# Patient Record
Sex: Female | Born: 1955
Health system: Southern US, Community
[De-identification: ages and names within clinical notes are randomized; demographics above are authoritative.]

## PROBLEM LIST (undated history)

## (undated) DIAGNOSIS — R011 Cardiac murmur, unspecified: Secondary | ICD-10-CM

## (undated) DIAGNOSIS — R0600 Dyspnea, unspecified: Secondary | ICD-10-CM

## (undated) DIAGNOSIS — F419 Anxiety disorder, unspecified: Secondary | ICD-10-CM

## (undated) DIAGNOSIS — I10 Essential (primary) hypertension: Secondary | ICD-10-CM

## (undated) DIAGNOSIS — J45909 Unspecified asthma, uncomplicated: Secondary | ICD-10-CM

## (undated) DIAGNOSIS — R0602 Shortness of breath: Secondary | ICD-10-CM

## (undated) DIAGNOSIS — R112 Nausea with vomiting, unspecified: Secondary | ICD-10-CM

## (undated) DIAGNOSIS — M797 Fibromyalgia: Secondary | ICD-10-CM

## (undated) DIAGNOSIS — K219 Gastro-esophageal reflux disease without esophagitis: Secondary | ICD-10-CM

## (undated) DIAGNOSIS — J189 Pneumonia, unspecified organism: Secondary | ICD-10-CM

## (undated) DIAGNOSIS — G4730 Sleep apnea, unspecified: Secondary | ICD-10-CM

## (undated) DIAGNOSIS — F32A Depression, unspecified: Secondary | ICD-10-CM

## (undated) DIAGNOSIS — G2581 Restless legs syndrome: Secondary | ICD-10-CM

## (undated) DIAGNOSIS — E039 Hypothyroidism, unspecified: Secondary | ICD-10-CM

## (undated) DIAGNOSIS — Z9889 Other specified postprocedural states: Secondary | ICD-10-CM

## (undated) DIAGNOSIS — F329 Major depressive disorder, single episode, unspecified: Secondary | ICD-10-CM

## (undated) DIAGNOSIS — M1990 Unspecified osteoarthritis, unspecified site: Secondary | ICD-10-CM

## (undated) DIAGNOSIS — K5900 Constipation, unspecified: Secondary | ICD-10-CM

## (undated) DIAGNOSIS — I700 Atherosclerosis of aorta: Secondary | ICD-10-CM

## (undated) DIAGNOSIS — R351 Nocturia: Secondary | ICD-10-CM

## (undated) DIAGNOSIS — R7303 Prediabetes: Secondary | ICD-10-CM

## (undated) HISTORY — PX: NASAL SINUS SURGERY: SHX719

## (undated) HISTORY — PX: COLONOSCOPY: SHX174

## (undated) HISTORY — PX: KNEE SURGERY: SHX244

## (undated) HISTORY — PX: KNEE ARTHROSCOPY: SUR90

## (undated) HISTORY — PX: TUBAL LIGATION: SHX77

## (undated) HISTORY — PX: SHOULDER SURGERY: SHX246

## (undated) HISTORY — PX: ABDOMINAL HYSTERECTOMY: SHX81

## (undated) HISTORY — PX: CHOLECYSTECTOMY: SHX55

## (undated) HISTORY — PX: CERVICAL DISC SURGERY: SHX588

## (undated) HISTORY — PX: SHOULDER DEBRIDEMENT: SHX1052

## (undated) HISTORY — PX: OTHER SURGICAL HISTORY: SHX169

## (undated) HISTORY — DX: Unspecified asthma, uncomplicated: J45.909

## (undated) HISTORY — DX: Atherosclerosis of aorta: I70.0

## (undated) HISTORY — PX: SINOSCOPY: SHX187

## (undated) HISTORY — PX: ANTERIOR CERVICAL DECOMP/DISCECTOMY FUSION: SHX1161

---

## 2006-01-02 ENCOUNTER — Ambulatory Visit (HOSPITAL_COMMUNITY): Admission: RE | Admit: 2006-01-02 | Discharge: 2006-01-03 | Payer: Self-pay | Admitting: Orthopedic Surgery

## 2006-04-04 ENCOUNTER — Ambulatory Visit (HOSPITAL_COMMUNITY): Admission: RE | Admit: 2006-04-04 | Discharge: 2006-04-05 | Payer: Self-pay | Admitting: Orthopedic Surgery

## 2006-10-02 ENCOUNTER — Ambulatory Visit (HOSPITAL_COMMUNITY): Admission: RE | Admit: 2006-10-02 | Discharge: 2006-10-03 | Payer: Self-pay | Admitting: Orthopedic Surgery

## 2007-12-08 ENCOUNTER — Encounter: Admission: RE | Admit: 2007-12-08 | Discharge: 2007-12-08 | Payer: Self-pay | Admitting: Orthopedic Surgery

## 2008-01-26 ENCOUNTER — Ambulatory Visit (HOSPITAL_COMMUNITY): Admission: RE | Admit: 2008-01-26 | Discharge: 2008-01-28 | Payer: Self-pay | Admitting: Orthopedic Surgery

## 2008-03-02 ENCOUNTER — Encounter: Admission: RE | Admit: 2008-03-02 | Discharge: 2008-03-02 | Payer: Self-pay | Admitting: Orthopedic Surgery

## 2008-04-21 ENCOUNTER — Ambulatory Visit (HOSPITAL_COMMUNITY): Admission: RE | Admit: 2008-04-21 | Discharge: 2008-04-22 | Payer: Self-pay | Admitting: Neurosurgery

## 2008-04-24 ENCOUNTER — Emergency Department (HOSPITAL_COMMUNITY): Admission: EM | Admit: 2008-04-24 | Discharge: 2008-04-25 | Payer: Self-pay | Admitting: Emergency Medicine

## 2008-08-02 ENCOUNTER — Ambulatory Visit (HOSPITAL_COMMUNITY): Admission: RE | Admit: 2008-08-02 | Discharge: 2008-08-02 | Payer: Self-pay | Admitting: Orthopedic Surgery

## 2008-08-05 ENCOUNTER — Inpatient Hospital Stay (HOSPITAL_COMMUNITY): Admission: EM | Admit: 2008-08-05 | Discharge: 2008-08-09 | Payer: Self-pay | Admitting: Emergency Medicine

## 2008-08-06 DIAGNOSIS — L089 Local infection of the skin and subcutaneous tissue, unspecified: Secondary | ICD-10-CM

## 2008-08-06 HISTORY — DX: Local infection of the skin and subcutaneous tissue, unspecified: L08.9

## 2008-08-07 ENCOUNTER — Ambulatory Visit: Payer: Self-pay | Admitting: Infectious Diseases

## 2008-08-10 ENCOUNTER — Encounter: Payer: Self-pay | Admitting: Infectious Diseases

## 2008-08-23 ENCOUNTER — Encounter: Payer: Self-pay | Admitting: Infectious Diseases

## 2008-08-23 DIAGNOSIS — S40919A Unspecified superficial injury of unspecified shoulder, initial encounter: Secondary | ICD-10-CM

## 2008-08-23 DIAGNOSIS — S40929A Unspecified superficial injury of unspecified upper arm, initial encounter: Secondary | ICD-10-CM

## 2008-08-23 DIAGNOSIS — Z8719 Personal history of other diseases of the digestive system: Secondary | ICD-10-CM | POA: Insufficient documentation

## 2008-08-23 DIAGNOSIS — Z9889 Other specified postprocedural states: Secondary | ICD-10-CM | POA: Insufficient documentation

## 2008-08-23 DIAGNOSIS — L089 Local infection of the skin and subcutaneous tissue, unspecified: Secondary | ICD-10-CM | POA: Insufficient documentation

## 2008-08-23 DIAGNOSIS — Z981 Arthrodesis status: Secondary | ICD-10-CM | POA: Insufficient documentation

## 2008-08-23 DIAGNOSIS — Z9089 Acquired absence of other organs: Secondary | ICD-10-CM | POA: Insufficient documentation

## 2008-08-23 DIAGNOSIS — Z90710 Acquired absence of both cervix and uterus: Secondary | ICD-10-CM

## 2008-08-23 HISTORY — DX: Arthrodesis status: Z98.1

## 2008-08-23 HISTORY — DX: Acquired absence of both cervix and uterus: Z90.710

## 2008-08-26 ENCOUNTER — Encounter: Payer: Self-pay | Admitting: Infectious Diseases

## 2008-08-26 ENCOUNTER — Telehealth: Payer: Self-pay | Admitting: Infectious Diseases

## 2008-09-05 ENCOUNTER — Encounter: Payer: Self-pay | Admitting: Infectious Diseases

## 2008-09-05 ENCOUNTER — Ambulatory Visit: Payer: Self-pay | Admitting: Infectious Diseases

## 2008-09-05 LAB — CONVERTED CEMR LAB
Albumin: 4.2 g/dL (ref 3.5–5.2)
Alkaline Phosphatase: 95 units/L (ref 39–117)
Basophils Relative: 1 % (ref 0–1)
CO2: 24 meq/L (ref 19–32)
CRP: 1.4 mg/dL — ABNORMAL HIGH (ref <0.6)
Chloride: 102 meq/L (ref 96–112)
Creatinine, Ser: 0.77 mg/dL (ref 0.40–1.20)
Eosinophils Relative: 4 % (ref 0–5)
Glucose, Bld: 83 mg/dL (ref 70–99)
HCT: 41.2 % (ref 36.0–46.0)
Hemoglobin: 13.4 g/dL (ref 12.0–15.0)
Lymphs Abs: 2.1 10*3/uL (ref 0.7–4.0)
MCHC: 32.5 g/dL (ref 30.0–36.0)
Monocytes Relative: 8 % (ref 3–12)
Neutrophils Relative %: 57 % (ref 43–77)
Platelets: 229 10*3/uL (ref 150–400)
Potassium: 4.5 meq/L (ref 3.5–5.3)
RBC: 4.75 M/uL (ref 3.87–5.11)
Sodium: 138 meq/L (ref 135–145)

## 2008-09-07 ENCOUNTER — Encounter: Payer: Self-pay | Admitting: Infectious Diseases

## 2008-09-08 ENCOUNTER — Telehealth: Payer: Self-pay | Admitting: Infectious Diseases

## 2008-09-14 ENCOUNTER — Telehealth: Payer: Self-pay

## 2008-09-19 ENCOUNTER — Ambulatory Visit: Payer: Self-pay | Admitting: Infectious Diseases

## 2008-09-19 LAB — CONVERTED CEMR LAB
Basophils Relative: 1 % (ref 0–1)
Eosinophils Absolute: 0.2 10*3/uL (ref 0.0–0.7)
HCT: 41.4 % (ref 36.0–46.0)
Hemoglobin: 13.5 g/dL (ref 12.0–15.0)
MCHC: 32.6 g/dL (ref 30.0–36.0)
MCV: 86.3 fL (ref >=78.0)
Platelets: 314 10*3/uL (ref 150–400)
RDW: 12.8 % (ref 11.5–15.5)

## 2008-09-20 ENCOUNTER — Encounter: Payer: Self-pay | Admitting: Infectious Diseases

## 2008-09-22 ENCOUNTER — Encounter: Payer: Self-pay | Admitting: Infectious Diseases

## 2008-09-24 ENCOUNTER — Encounter: Admission: RE | Admit: 2008-09-24 | Discharge: 2008-09-24 | Payer: Self-pay | Admitting: Infectious Diseases

## 2008-09-26 ENCOUNTER — Telehealth: Payer: Self-pay | Admitting: Infectious Diseases

## 2008-09-30 ENCOUNTER — Telehealth: Payer: Self-pay | Admitting: Infectious Diseases

## 2008-10-06 ENCOUNTER — Ambulatory Visit (HOSPITAL_BASED_OUTPATIENT_CLINIC_OR_DEPARTMENT_OTHER): Admission: RE | Admit: 2008-10-06 | Discharge: 2008-10-06 | Payer: Self-pay | Admitting: Orthopedic Surgery

## 2008-10-18 ENCOUNTER — Ambulatory Visit: Payer: Self-pay | Admitting: Infectious Diseases

## 2008-11-21 ENCOUNTER — Encounter: Payer: Self-pay | Admitting: Infectious Diseases

## 2009-04-06 ENCOUNTER — Ambulatory Visit (HOSPITAL_BASED_OUTPATIENT_CLINIC_OR_DEPARTMENT_OTHER): Admission: RE | Admit: 2009-04-06 | Discharge: 2009-04-06 | Payer: Self-pay | Admitting: Orthopedic Surgery

## 2009-06-13 ENCOUNTER — Encounter: Admission: RE | Admit: 2009-06-13 | Discharge: 2009-06-13 | Payer: Self-pay | Admitting: Orthopedic Surgery

## 2010-02-25 ENCOUNTER — Encounter: Payer: Self-pay | Admitting: Orthopedic Surgery

## 2010-03-05 LAB — BASIC METABOLIC PANEL
BUN: 20 mg/dL (ref 6–23)
CO2: 27 mEq/L (ref 19–32)
Calcium: 9.3 mg/dL (ref 8.4–10.5)
Chloride: 105 mEq/L (ref 96–112)
Creatinine, Ser: 0.85 mg/dL (ref 0.4–1.2)
GFR calc Af Amer: >60 mL/min (ref >60)
GFR calc non Af Amer: >60 mL/min (ref >60)
Glucose, Bld: 66 mg/dL — ABNORMAL LOW (ref 70–99)
Potassium: 5.3 mEq/L — ABNORMAL HIGH (ref 3.5–5.1)
Sodium: 138 mEq/L (ref 135–145)

## 2010-03-05 LAB — SURGICAL PCR SCREEN
MRSA, PCR: NEGATIVE
Staphylococcus aureus: POSITIVE — AB

## 2010-03-05 LAB — CBC
HCT: 40.2 % (ref 36.0–46.0)
Hemoglobin: 13.2 g/dL (ref 12.0–15.0)
MCH: 28.5 pg (ref 26.0–34.0)
MCHC: 32.8 g/dL (ref 30.0–36.0)
MCV: 86.8 fL (ref 78.0–100.0)
Platelets: 286 10*3/uL (ref 150–400)
RBC: 4.63 MIL/uL (ref 3.87–5.11)
RDW: 12.6 % (ref 11.5–15.5)
WBC: 10 10*3/uL (ref 4.0–10.5)

## 2010-03-08 ENCOUNTER — Ambulatory Visit (HOSPITAL_COMMUNITY): Payer: MEDICARE

## 2010-03-08 ENCOUNTER — Inpatient Hospital Stay (HOSPITAL_COMMUNITY)
Admission: RE | Admit: 2010-03-08 | Discharge: 2010-03-09 | DRG: 473 | Disposition: A | Discharge status: Home / Self Care | Payer: MEDICARE | Source: Ambulatory Visit | Attending: Neurosurgery | Admitting: Neurosurgery

## 2010-03-08 ENCOUNTER — Other Ambulatory Visit (HOSPITAL_COMMUNITY): Payer: Self-pay | Admitting: Neurosurgery

## 2010-03-08 DIAGNOSIS — M5412 Radiculopathy, cervical region: Secondary | ICD-10-CM

## 2010-03-08 DIAGNOSIS — M5030 Other cervical disc degeneration, unspecified cervical region: Secondary | ICD-10-CM | POA: Diagnosis present

## 2010-03-08 DIAGNOSIS — I10 Essential (primary) hypertension: Secondary | ICD-10-CM | POA: Diagnosis present

## 2010-03-08 DIAGNOSIS — M47812 Spondylosis without myelopathy or radiculopathy, cervical region: Secondary | ICD-10-CM | POA: Diagnosis present

## 2010-03-08 DIAGNOSIS — K219 Gastro-esophageal reflux disease without esophagitis: Secondary | ICD-10-CM | POA: Diagnosis present

## 2010-03-08 DIAGNOSIS — Z981 Arthrodesis status: Secondary | ICD-10-CM

## 2010-03-08 DIAGNOSIS — M5020 Other cervical disc displacement, unspecified cervical region: Principal | ICD-10-CM | POA: Diagnosis present

## 2010-03-09 NOTE — Op Note (Signed)
NAMESKYLEE, Kathy [name]             ACCOUNT NO.:  [id number]  MEDICAL RECORD NO.:  [id number]           PATIENT TYPE:  I  LOCATION:  3535                         FACILITY:  [facility]  PHYSICIAN:  Hewitt Shorts, M.D.DATE OF BIRTH:  08/02/1955  DATE OF PROCEDURE:  03/08/2010 DATE OF DISCHARGE:                              OPERATIVE REPORT   PREOPERATIVE DIAGNOSIS:  Cervical disk herniation, cervical degenerative disk disease, cervical spondylosis, and cervical radiculopathy.  POSTOPERATIVE DIAGNOSIS:  Cervical disk herniation, cervical degenerative disk disease, cervical spondylosis, and cervical radiculopathy.  PROCEDURE:  C5-6 and C6-7 anterior cervical decompression and arthrodesis with allograft and Tether cervical plating.  SURGEON:  Hewitt Shorts, MD  ASSISTANT:  Clydene Fake, MD  ANESTHESIA:  General endotracheal.  INDICATIONS:  The patient is a 55 year old woman who presented with left cervical radiculopathy.  She had a previous C3-4 and C4-5 anterior cervical decompression and arthrodesis done a little less than 2 years ago but developed left cervical radicular pain syndrome which has persisted despite extensive nonsurgical treatment and therefore is admitted now for surgical decompression and stabilization.  PROCEDURE:  The patient was brought to the operating room, placed under general endotracheal anesthesia.  The patient was placed in 10 pounds of halter traction.  The neck was prepped with Betadine soap and solution and draped in a sterile fashion.  Horizontal incision was made in the left side of the neck.  The line of the incision was then infiltrated with local anesthetic with epinephrine.  Incision was made, carried down through the subcutaneous tissue.  Bipolar cautery and electrocautery were used to maintain hemostasis.  Dissection was carried out through the avascular plane to the ventral aspect of the vertebral column.  An x- ray was taken and  the C5-6 and C6-7 intervertebral disk space identified.  We began the diskectomy at each level removing the anterior osteophytic overgrowth and then entering the disk space proceeding with a diskectomy using microcurettes and pituitary rongeurs.  The cartilaginous endplates were removed using microcurettes along with the high-speed drill, and then the operating microscope was draped and brought into the field to provide additional navigation, illumination, and visualization.  The remainder of the decompression was performed using microdissection and microsurgical technique.  Posterior osteophytic overgrowth was removed using the high-speed drill along with a 2-mm Kerrison punch with a thin footplate.  We then carefully opened the posterior longitudinal ligament and removed thickened ligament and decompressed the spinal canal and thecal sac.  We then turned our attention to the neural foramen where there was spondylotic encroachment bilaterally at each level and carefully removed that decompressing the exiting nerve roots.  As we did that on the left side at C6-7, there was disk fragment in the neural foramen compressing the C7 nerve root within the foramen.  This was removed with good decompression of the left C7 nerve root.  It was felt that this was probably the cause of the left cervical radicular pain syndrome.  Once decompression was complete, hemostasis was established with the use of Gelfoam soaked in thrombin. We measured the height of the intervertebral disk space and selected two  6-mm allograft implants.  They were hydrated in saline solution, positioned in the intervertebral disk space and countersunk.  We then selected a 32-mm Tether cervical plate which was secured to the vertebra with 4-mm screws.  We used a pair of 12-mm variable screws at C5, another pair of 12-mm variable screws at C7, and a single 13-mm fixed screw at C6.  Each screw hole was drilled and the screws placed  in alternating fashion.  Care was taken to position the plate just below the old plate from B1-4.  It was felt that we had good bony purchase for the screws at C5.  Once all five screws were in place, final tightening was performed.  Wound was irrigated with bacitracin solution, checked for hemostasis which was established and confirmed, and then we proceeded with closure.  Platysma was closed with interrupted inverted 2- 0 undyed Vicryl sutures, subcutaneous and subcuticular were closed with interrupted inverted 3-0 undyed Vicryl sutures, skin edges were approximated with Dermabond.  Procedure was tolerated well.  The estimated blood loss was 50 mL.  Sponge count correct.  Following surgery, the patient was taken out of cervical traction, reversed from the anesthetic to be extubated, was transferred to the recovery room for further care.     Hewitt Shorts, M.D.     RWN/MEDQ  D:  03/08/2010  T:  03/09/2010  Job:  [id number]  Electronically Signed by Shirlean Kelly M.D. on 03/09/2010 09:13:39 AM

## 2010-04-29 LAB — POCT HEMOGLOBIN-HEMACUE: Hemoglobin: 14.5 g/dL (ref 12.0–15.0)

## 2010-05-11 LAB — ANAEROBIC CULTURE

## 2010-05-11 LAB — TISSUE CULTURE

## 2010-05-13 LAB — URINALYSIS, ROUTINE W REFLEX MICROSCOPIC
Bilirubin Urine: NEGATIVE
Glucose, UA: NEGATIVE mg/dL
Ketones, ur: NEGATIVE mg/dL
Nitrite: NEGATIVE
Specific Gravity, Urine: 1.021 (ref 1.005–1.030)
Urobilinogen, UA: 0.2 mg/dL (ref 0.0–1.0)

## 2010-05-13 LAB — CBC
MCHC: 34 g/dL (ref 30.0–36.0)
MCHC: 34.3 g/dL (ref 30.0–36.0)
MCV: 89.4 fL (ref 78.0–100.0)
MCV: 89.7 fL (ref 78.0–100.0)
Platelets: 252 10*3/uL (ref 150–400)
Platelets: 303 10*3/uL (ref 150–400)
RDW: 12 % (ref 11.5–15.5)
RDW: 12.5 % (ref 11.5–15.5)
WBC: 14.9 10*3/uL — ABNORMAL HIGH (ref 4.0–10.5)

## 2010-05-13 LAB — WOUND CULTURE

## 2010-05-13 LAB — URINE MICROSCOPIC-ADD ON

## 2010-05-13 LAB — COMPREHENSIVE METABOLIC PANEL
AST: 33 U/L (ref 0–37)
CO2: 30 mEq/L (ref 19–32)
Calcium: 8.4 mg/dL (ref 8.4–10.5)
Creatinine, Ser: 0.68 mg/dL (ref 0.4–1.2)
GFR calc Af Amer: >60 mL/min (ref >60)
GFR calc non Af Amer: >60 mL/min (ref >60)
Total Protein: 5.7 g/dL — ABNORMAL LOW (ref 6.0–8.3)

## 2010-05-13 LAB — BASIC METABOLIC PANEL
BUN: 8 mg/dL (ref 6–23)
Calcium: 8.7 mg/dL (ref 8.4–10.5)
GFR calc Af Amer: >60 mL/min (ref >60)
GFR calc non Af Amer: >60 mL/min (ref >60)
Sodium: 136 mEq/L (ref 135–145)

## 2010-05-13 LAB — DIFFERENTIAL
Eosinophils Absolute: 0.5 10*3/uL (ref 0.0–0.7)
Eosinophils Relative: 3 % (ref 0–5)
Lymphocytes Relative: 14 % (ref 12–46)
Lymphs Abs: 2.1 10*3/uL (ref 0.7–4.0)
Monocytes Absolute: 0.8 10*3/uL (ref 0.1–1.0)
Monocytes Relative: 5 % (ref 3–12)
Neutro Abs: 11.5 10*3/uL — ABNORMAL HIGH (ref 1.7–7.7)

## 2010-05-13 LAB — ANAEROBIC CULTURE

## 2010-05-17 LAB — CBC
HCT: 39.6 % (ref 36.0–46.0)
Hemoglobin: 13.7 g/dL (ref 12.0–15.0)
MCHC: 34.7 g/dL (ref 30.0–36.0)
MCV: 87.2 fL (ref 78.0–100.0)
MCV: 88.1 fL (ref 78.0–100.0)
Platelets: 251 10*3/uL (ref 150–400)
RBC: 4.35 MIL/uL (ref 3.87–5.11)
RBC: 4.49 MIL/uL (ref 3.87–5.11)
RDW: 12.6 % (ref 11.5–15.5)
WBC: 6.4 10*3/uL (ref 4.0–10.5)
WBC: 5.5 10*3/uL (ref 4.0–10.5)

## 2010-05-17 LAB — DIFFERENTIAL
Eosinophils Absolute: 0.4 10*3/uL (ref 0.0–0.7)
Lymphs Abs: 2.3 10*3/uL (ref 0.7–4.0)
Monocytes Relative: 6 % (ref 3–12)
Neutro Abs: 3.4 10*3/uL (ref 1.7–7.7)
Neutrophils Relative %: 52 % (ref 43–77)

## 2010-05-17 LAB — PROTIME-INR
INR: 0.9 (ref 0.00–1.49)
Prothrombin Time: 12.5 seconds (ref 11.6–15.2)

## 2010-05-17 LAB — POCT I-STAT, CHEM 8
HCT: 39 % (ref 36.0–46.0)
Hemoglobin: 13.3 g/dL (ref 12.0–15.0)
Potassium: 4.8 mEq/L (ref 3.5–5.1)
Sodium: 141 mEq/L (ref 135–145)

## 2010-05-17 LAB — URINALYSIS, ROUTINE W REFLEX MICROSCOPIC
Glucose, UA: NEGATIVE mg/dL
Hgb urine dipstick: NEGATIVE
Specific Gravity, Urine: 1.017 (ref 1.005–1.030)

## 2010-05-17 LAB — APTT: aPTT: 28 seconds (ref 24–37)

## 2010-06-19 NOTE — Op Note (Signed)
NAMELAQUASIA, PINCUS             ACCOUNT NO.:  [id number]   MEDICAL RECORD NO.:  [id number]          PATIENT TYPE:  OIB   LOCATION:  1614                         FACILITY:  [facility]   PHYSICIAN:  Marlowe Kays, M.D.  DATE OF BIRTH:  [birth year]   DATE OF PROCEDURE:  01/26/2008  DATE OF DISCHARGE:                               OPERATIVE REPORT   PREOPERATIVE DIAGNOSES:  1. Recurrent rotator cuff tear.  2. Detached Mitek anchor with diagnosis #1 possibly secondary to #2      right shoulder.   POSTOPERATIVE DIAGNOSES:  1. Recurrent rotator cuff tear.  2. Detached Mitek anchor with diagnosis #1 possibly secondary to #2      right shoulder.   OPERATION:  1. Removal of multiple Ethibond sutures and Mitek anchor right      shoulder using C-arm.  2. Repair of recurrent rotator cuff tear.   SURGEON:  Marlowe Kays, M.D.   ASSISTANTDruscilla Brownie. Cherlynn June.   ANESTHESIA:  Interscalene block followed by general anesthesia.   INDICATIONS FOR PROCEDURE:  She had been doing reasonably well with her  right shoulder until she had the onset of pain leading to an arthrogram  which demonstrated the two diagnoses.  Accordingly, she is here at this  time for retrieval of the detached Mitek anchor and repair of the  recurrent rotator cuff tear.   PROCEDURE IN DETAIL:  Interscalene block by anesthesiologist followed by  general anesthesia, beach chair position on the [place] frame, right  shoulder girdle was prepped with DuraPrep, draped in sterile field,  prophylactic antibiotics and time-out performed.  I went through the old  surgical incision excising the scar.  I dissected soft tissue off the  residual anterior acromion, worked my way down through bursal tissue to  what looked like a very smooth rotator cuff lateral ward but at the  level of the roughly the coracoid and joint and a disruption of the  repair with multiple nonabsorbable suture being visible.  We then began  removing them  thinking we would find the anchor but after continually  removing multiple sutures we were unable to find the anchor and I  brought in the C-arm.  Fortunately we were on the [place] frame which  allowed [place] to take good C-arm pictures.  Even with that we had great  difficulty in finding the Mitek anchor which I ultimately did and it  will be saved for the patient.  After removal of the anchor I then  repaired the deeper structures of the rotator cuff at the level of the  coracoid and distal clavicle resection roughly.  I then advanced  medially the more pristine looking rotator cuff lateral ward cinching  all this down with multiple interrupted #1 Ethibond suture.  In order to  get what we felt was a nice tight closure and advance the rotator cuff I  had to keep the arm abducted at about 45 degrees which a position we  maintained by placing her arm on the [employer] stand.  After completion of  repair I then irrigated the wound well with saline and  we closed the  deltoid muscle and the fascia over what was left of the anterior  acromion which I also trimmed down even more in the region of the distal  clavicle resection with multiple interrupted #1 Vicryl, subcutaneous  tissue I then closed with a combination of #1 and 2-0 Vicryl deep and 4-  0 Vicryl subcutaneously and Steri-Strips on the  skin.  Dry sterile dressing and shoulder immobilizer applied with the  arm abducted to about 45 degrees.  She tolerated the procedure well and  was taken to recovery in satisfactory condition with no known  complications.  The staple will be saved for the patient.           ______________________________  Marlowe Kays, M.D.     JA/MEDQ  D:  01/26/2008  T:  01/27/2008  Job:  [id number]

## 2010-06-19 NOTE — Op Note (Signed)
[name], Kathy [name]             ACCOUNT NO.:  [id number]   MEDICAL RECORD NO.:  [id number]          PATIENT TYPE:  OIB   LOCATION:  1530                         FACILITY:  [facility]   PHYSICIAN:  Marlowe Kays, M.D.  DATE OF BIRTH:  1955-10-31   DATE OF PROCEDURE:  10/02/2006  DATE OF DISCHARGE:                               OPERATIVE REPORT   PREOPERATIVE DIAGNOSIS:  Recurrent rotator cuff tear right shoulder.   POSTOPERATIVE DIAGNOSIS:  Recurrent rotator cuff tear right shoulder.   OPERATION:  Repair of recurrent rotator cuff tear right shoulder using  two 2-pronged rotator cuff anchors and graft jacket.   SURGEON:  Marlowe Kays, M.D.   ASSISTANTDruscilla Brownie. Idolina Primer, P.A.-C.   ANESTHESIA:  General.   PATHOLOGY AND JUSTIFICATION FOR PROCEDURE:  This is her fourth operative  procedure on the right shoulder.  Previous repair failed. She  unfortunately returned to a very vigorous job.  Arthrogram in May 2008  has demonstrated leakage through the previous reconstruction. This  leakage could very well have been in two separate areas.  It was little  difficult to tell from the arthrogram.  See operative description below  for additional details.   PROCEDURE:  Satisfactory preoperative interscalene block, satisfactory  general anesthesia, prophylactic antibiotics, beach-chair position on  the [place] frame.  Right shoulder girdle was prepped with DuraPrep and  draped in a sterile field.  Time-out performed.  Ioban employed.  I went  through the old surgical incision cautiously because of scar.  Previous  soft tissue was cut off the anterior acromion with cutting cautery and  carefully dissected off the previous residual anterior acromionectomy.  This was adequate and did not require any further bone removal.  It was  very difficult to make out the exact tissue planes.  We were able to  partly following the repair based on the nonabsorbable suture.  By  palpation, I found  several areas where there appeared to be defects in  the rotator cuff repair, and I managed this by placing a two-pronged  rotator cuff anchor laterally and bringing these tissues more  lateralward, covering up some of the apparent palpable defects, and then  supplementing this with multiple interrupted #1 Ethibond until it  appeared that we had a nice stable, thick repair.  I then used methylene  blue, injecting it into the posterior capsule.  The only area of leakage  was posteriorly in the region of the injection.  I employed a second  rotator cuff anchor at this point, cinching down the posterior rotator  cuff as well, and I then used a graft jacket which I placed over all the  repair both to supplement the repair and also to give a smooth gliding  surface.  This was tacked down with interrupted 2-0 Vicryl.  I  piecrusted the graft at the  conclusion.  The wound was then well  irrigated with sterile saline, and closure was performed of the fascia  and muscle over the residual over  the anterior acromion and in the deltoid area with interrupted #1  Vicryl, subcutaneous tissue with 2-0 Vicryl,  Steri-Strips on the skin.  Dry sterile dressing was applied, followed by a shoulder immobilizer.  She tolerated the procedure well and was taken to the recovery room in  satisfactory condition, with no known complications.           ______________________________  Marlowe Kays, M.D.     JA/MEDQ  D:  10/02/2006  T:  10/03/2006  Job:  [id number]

## 2010-06-19 NOTE — Op Note (Signed)
NAMEHANNE, Kathy [name]             ACCOUNT NO.:  [id number]   MEDICAL RECORD NO.:  [id number]          PATIENT TYPE:  OIB   LOCATION:  3528                         FACILITY:  [facility]   PHYSICIAN:  Hewitt Shorts, M.D.DATE OF BIRTH:  Jul 19, 1955   DATE OF PROCEDURE:  04/21/2008  DATE OF DISCHARGE:                               OPERATIVE REPORT   PREOPERATIVE DIAGNOSES:  1. Cervical spondylosis.  2. Cervical degenerative disk disease.  3. Cervicalgia.  4. Cervical radiculopathy.   POSTOPERATIVE DIAGNOSES:  1. Cervical spondylosis.  2. Cervical degenerative disk disease.  3. Cervicalgia.  4. Cervical radiculopathy.   PROCEDURE:  C3-4 and C4-5 anterior cervical decompression and  arthrodesis with allograft and Tether cervical plating.   SURGEON:  Hewitt Shorts, MD   ASSISTANTS:  1. Russell L. Webb Silversmith, RN  2. Clydene Fake, MD   ANESTHESIA:  General endotracheal.   INDICATIONS:  The patient is a 55 year old woman who presented with neck  pain and pain extending down to the shoulders bilaterally.  She has  advanced degenerative disk disease and spondylosis at C3-4 and C4-5  level with bilateral with osteophytic neuroforaminal encroachment with  superimposed spondylotic disk herniation.  A decision made to proceed  with a 2-level decompression and arthrodesis.   PROCEDURE:  The patient was brought to the operating room, placed under  general endotracheal anesthesia.  The patient was placed in 10 pounds of  Halter traction.  The neck was prepped with Betadine soap and solution,  and draped in a sterile fashion.  The line of the incision was  infiltrated with local anesthetic with epinephrine and a horizontal  incision was made on the left side of the neck.  Dissection was carried  down to the subcutaneous tissue and platysma.  Bipolar cautery was used  to maintain hemostasis throughout the procedure.  Dissection was then  carried down through an avascular plane leaving  the sternocleidomastoid,  carotid artery and jugular vein laterally, and the trachea and esophagus  medially.  The ventral aspect of the vertebral column was identified and  the localizing x-ray taken and the C3-4 and C4-5 intervertebral disk  spaces identified.  The operating microscope was draped and brought to  the field to provide additional navigation, illumination, and  visualization, and the decompression was performed using microdissection  and microsurgical technique.  At each level, the annulus was incised and  diskectomy done with pituitary rongeurs.  Anterior osteophytic  overgrowth was removed using Kerrison punch and the osteophyte removal  tool.  Diskectomy was continued posteriorly through the disk space at  each level and then we began to remove the cartilaginous endplates of  the vertebral body surfaces using microcurettes along with the [employer]  pneumatic high-speed drill.  The diskectomy was continued posteriorly  where there was significant posterior osteophytic overgrowth at each  level.  At each level, this was pinned using the high-speed drill and  then the last bit of bone material was removed using a 2-mm Kerrison  punch with a thin foot plate.  There was spondylotic disk herniation as  well and this was carefully  removed using a 2-mm Kerrison punch along  with the posterior longitudinal ligament.  In the end, good  decompression of the spinal canal and thecal sac was achieved at each  level.  We then turned our attention to the neuroforaminal and further  removed spondylotic spurring at each level bilaterally.   Once the decompression was completed, we established hemostasis with use  of Gelfoam soaked in thrombin and then proceeded with the arthrodesis.  We measured the height of the intervertebral disk spaces and selected  two 6-mm allograft implants.  They were both hydrated in saline solution  and then we placed the implants in the intervertebral disk space  and  they were countersunk.   Once both grafts were in place, traction was discontinued and we  selected a 29-mm Tether cervical plate.  It was positioned over the  fusion construct and secured to the vertebra with 4-mm variable angle  screws, placing 14-mm screw on the right at C3, 12-mm screw on the left  at C3, 14-mm screw at C4, and a pair of 14-mm screws at C5.  Each screw  hole was drilled, they were tapped as needed and screws placed.  Once  all 5 screws were in place, final tightening was performed.  An x-ray  was taken, which showed the bone graft, plate, and screws all in good  position.  The alignment was good and then the wound was irrigated with  bacitracin solution.  Checked for hemostasis, which was established and  confirmed, and then we proceeded with closure.  The platysma was closed  with interrupted inverted 2-0 undyed Vicryl sutures.  Subcutaneous and  subcuticular were closed with interrupted inverted 3-0 undyed Vicryl  sutures.  The skin was reapproximated with Dermabond.  The procedure was  tolerated well.  The estimated blood loss was 75 mL.  Sponge and needle  count were correct.  Following surgery, the patient is to be reversed  from the anesthetic, extubated, and transferred to the recovery room for  further care.      Hewitt Shorts, M.D.  Electronically Signed     RWN/MEDQ  D:  04/21/2008  T:  04/22/2008  Job:  [id number]

## 2010-06-19 NOTE — Op Note (Signed)
NAMEBRENLEE, Kathy [name]             ACCOUNT NO.:  [id number]   MEDICAL RECORD NO.:  [id number]          PATIENT TYPE:  INP   LOCATION:  1618                         FACILITY:  [facility]   PHYSICIAN:  Leonides Grills, M.D.     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   OPERATIONS:  I and D to bone, left shoulder wound.   ANESTHESIA:  General.   SURGEON:  Leonides Grills, M.D.   ASSISTANT:  Rexene Edison, PA-C.   ESTIMATED BLOOD LOSS:  Minimal.   CULTURES:  Anaerobic and aerobic cultures obtained.   COMPLICATIONS:  None.   DRAINS:  One-eighth inch drain placed.   DISPOSITION:  Stable to PAR.   INDICATIONS:  This is a 55 year old female who Tuesday of this week  underwent a distal clavicle resection and subacromial decompression by  Dr. Simonne Come.  Yesterday she was seen by Dr. Simonne Come and had  increased shoulder pain.  She did spike a temperature at that point to  100 degrees.  She was then advised to go to the emergency room later on  that day, where she was evaluated by the ER doctor.  At that point she  did not have any significant erythema or drainage.  However, we admitted  her as a precautionary measure due to pain.  At that point she was  afebrile in the ER, with T-max of 100.8.  The next morning she had  drainage from the wound that was purulent and then 7 hours later she was  taken to the OR for an I and D.  She was consented for the above  procedure.  All risks of infection, nerve or vessel injury, the  possibility of persistent infection and pain, and wound healing  complications were all explained.  Questions were  encouraged and  answered.   OPERATION:  The patient was brought to the operating room and placed in  supine position after adequate general tube anesthesia was administered  as well as Ancef 1 gram IV piggyback.  The left shoulder was then  prepped and draped in a sterile manner.  Through the initial incision  over the distal clavicle, the Vicryl stitches were removed.  Once the  stitches were removed and the deep stitches were removed as well, there  was gross purulence.  Cultures were obtained.  The area was copiously  irrigated with normal saline as well as antibiotic saline.  At the end  of procedure the tissues were pink and clean with no evidence of  infection.  The shoulder also was ranged and we were  able to get to the subacromial space as well.  There was no gross  purulence in the subacromial space as well.  A one-eighth inch drain was  then placed.  A 3-0 nylon stitch  was then placed.  A sterile dressing  was applied.  Patient was stable to the PAR.      Leonides Grills, M.D.  Electronically Signed     PB/MEDQ  D:  08/06/2008  T:  08/06/2008  Job:  [id number]

## 2010-06-19 NOTE — Op Note (Signed)
[name], [name]             ACCOUNT NO.:  [id number]   MEDICAL RECORD NO.:  [id number]          PATIENT TYPE:  AMB   LOCATION:  DAY                          FACILITY:  [facility]   PHYSICIAN:  Marlowe Kays, M.D.  DATE OF BIRTH:  04-12-1955   DATE OF PROCEDURE:  DATE OF DISCHARGE:                               OPERATIVE REPORT   PREOPERATIVE DIAGNOSES:  1. Degenerative arthritis of acromioclavicular joint.  2. Chronic impingement syndrome with rotator cuff tendinopathy.  3. Labral tear.   POSTOPERATIVE DIAGNOSES:  1. Degenerative arthritis of acromioclavicular joint.  2. Chronic impingement syndrome with rotator cuff tendinopathy.  3. Labral tear.   OPERATION:  1. Left shoulder arthroscopy with debridement of labrum and      undersurface of rotator cuff.  2. Arthroscopic subacromial decompression.  3. Open distal clavicle resection.   SURGEON:  Marlowe Kays, M.D.   ASSISTANTDruscilla [name]. Kathy [name].   ANESTHESIA:  General.   DESCRIPTION OF PROCEDURE:  She has had open rotator cuff repair on the  right.  She is having pain in the left shoulder with plain x-ray showing  AC joint arthritis.  An MRI confirmed this as well as the labral tear  and rotator cuff tendinopathy without any tear.  Accordingly, she is  here today for the above mentioned surgery.   PROCEDURE:  Satisfactory general anesthesia, beach chair position on the  sliding frame.  Left shoulder groove was prepped with DuraPrep and  draped in sterile field.  Time-out performed and initial joint marked  out in the subacromial space.  Distal clavicle resection site and  position for lateral and posterior portals, infiltrated with 5% Marcaine  adrenaline.  Time-out had been performed.  Through a posterolateral  portal, I atraumatically entered the glenohumeral joint with finding of  some disruption of the labrum which was degenerative in nature near the  biceps anchor.  Biceps tendon was intact and the  glenohumeral joint  looked good.  There was a little bit of fraying of the undersurface of  the rotator cuff near the biceps exit.  Accordingly, I advanced the  scope between the biceps tendon and subscapularis using switching stick.  I made the anterior incision and placed a 4.2 Shaver in the joint and  debrided down the labrum and the undersurface of the rotator cuff with  pre- and post-films being taken.  I then redirected the scope in the  subacromial space through a lateral portal, introduced a 4.2 Shaver.  She had a good bit of disruption of the soft tissue and undersurface of  the rotator cuff which I cleaned up with the 4.2 Shaver.  I  then  brought in the 90 degree ArthroCare vaporizer.  Vaporizing the soft  tissue on the undersurface of the acromion.  She had a significant  impingement problem which was documented.  I then used a 4 mm oval bur  and began burring down the undersurface of the acromion.  I went back  and forth between the 3 instruments until we had a nice floor of  decompression.  It was documented with her arm  to the side and arm  abducted with the vaporizer in place.  I then removed all fluid possible  from the subacromial space and made an open incision of the distal  clavicle.  The [facility] joint was identified and I measured 1.5 cm medial to  it and placed a cautery line on the bone.  Then after undermining the  clavicle for protecting the underneath surfaces and underneath  structures, I used a micro saw to amputate the distal clavicle which I  removed with tile clip and cautery technique.  There was a little bit of  roughening on the surface of the acromial which I smoothed down with a  rasp.  The cut surface of the clavicle was smooth.  I covered this with  bone wax, irrigated the wound well and placed Gelfoam in the resection  site.  I then closed the wound with interrupted #1 Vicryl in the fascia  and 2-0 Vicryl subcutaneous tissue.  Steri-Strips on the skin  with 4.0  nylon in the 3 portals subacromial space and the clavicle resection  site.  I reinjected with 0.5% Marcaine adrenaline.  Dry sterile dressing  and shoulder immobilizer were applied.  She tolerated the procedure well  and was taken to recovery room in satisfactory condition with no known  complications.           ______________________________  Marlowe Kays, M.D.     JA/MEDQ  D:  08/02/2008  T:  08/02/2008  Job:  [id number]

## 2010-06-19 NOTE — H&P (Signed)
Kathy [name], Kathy [name]             ACCOUNT NO.:  [id number]   MEDICAL RECORD NO.:  [id number]          PATIENT TYPE:  INP   LOCATION:  1618                         FACILITY:  [facility]   PHYSICIAN:  Leonides Grills, M.D.     DATE OF BIRTH:  1956/01/25   DATE OF ADMISSION:  08/05/2008  DATE OF DISCHARGE:                              HISTORY & PHYSICAL   CHIEF COMPLAINT:  Left shoulder warmth with pain.   HISTORY OF PRESENT ILLNESS:  Kathy [name] is a pleasant 55 year old  white female with a history of left shoulder arthroscopy with  debridement of labrum and rotator cuff with a subacromial decompression  and distal clavicle resection August 02, 2008, by Dr. Simonne Come.  The  patient developed nausea and vomiting on August 03, 2008, then developed  fever and some swelling of left shoulder.  She was admitted through ER  last night due to pain and warmth in left shoulder.  The patient's last  meal was one cup of peanut butter and a sip of ginger ale at  approximately 8:00 a.m. today.   ALLERGIES:  ALEVE, and CELEBREX causes itching.   MEDICATIONS:  1. Pristiq 50 mg 1 daily.  2. Estradiol 1 mg daily.   PAST MEDICAL HISTORY:  Depression.   PAST SURGICAL HISTORY:  1. Hysterectomy.  2. Cholecystectomy.  3. Cervical fusion.  4. Five rotator surgeries of the right shoulder.  5. Left shoulder surgery August 02, 2008, as indicated above.  6. No complications after surgeries.   SOCIAL HISTORY:  Patient is not married.  Nonsmoker, occasional  alcoholic beverage.   REVIEW OF SYSTEMS:  Negative for hypertension, diabetes, respiratory  disease, cardiac disease.  No chest pain, shortness of breath.  Positive  for fevers, chills, constipation since surgery and positive for warmth  and pain in the left shoulder.  Otherwise, review of systems negative.   PHYSICAL EXAMINATION:  VITAL SIGNS:  Blood pressure 98/64, heart rate  104, respiratory rate 18, T. max 98.7, O2 saturation 94% on room air.  GENERAL:  Patient is alert and oriented.  CARDIAC:  Regular rate and rhythm.  No murmurs, rubs or gallops.  CHEST:  She has crackles in the left lower base, otherwise clear to  auscultation.  ABDOMEN:  Soft, nontender, with positive bowel sounds.  LEFT UPPER EXTREMITY:  Left shoulder incision reveals erythema.  Wound  incision is well approximated.  She does have expressible purulence.  Left hand is neurovascularly intact, radial pulse 2+.   LABORATORIES:  CBC August 05, 2008:  White count 14,900, hemoglobin 14.4,  hematocrit 41.8, platelets 303,000.   BMET August 05, 2008:  Sodium is 136, potassium 4.3, chloride 98, bicarb  29, BUN 8, creatinine 0.83, glucose 125.   Sed rate is elevated at 70.   Chest x-ray done August 05, 2008, showed bibasilar atelectasis.   ASSESSMENT:  1. The patient is a 55 year old white female with a superficial left      shoulder infection, status post left shoulder surgery August 02, 2008.  2. Constipation since surgery.   PLAN:  1. Open I  and D of left shoulder today.  2. The patient is placed n.p.o.  3. Dulcolax 10 mg per rectum x1 secondary to constipation.  4. Incentive spirometry is encouraged.  5. The patient was placed on empirical vancomycin as of last night 500      mg q.12 h.      Richardean Canal, P.A.      Leonides Grills, M.D.  Electronically Signed    GC/MEDQ  D:  08/06/2008  T:  08/06/2008  Job:  [id number]

## 2010-06-19 NOTE — Op Note (Signed)
Kathy [name], FITCH             ACCOUNT NO.:  [id number]   MEDICAL RECORD NO.:  [id number]          PATIENT TYPE:  AMB   LOCATION:  NESC                         FACILITY:  [facility]   PHYSICIAN:  Marlowe Kays, M.D.  DATE OF BIRTH:  [birth year]   DATE OF PROCEDURE:  10/06/2008  DATE OF DISCHARGE:                               OPERATIVE REPORT   PREOPERATIVE DIAGNOSES:  Possible residual infection, possible  osteomyelitis and possible rotator cuff tear left shoulder, status post  arthroscopic subacromial decompression, distal clavicle resection and  incision and drainage of abscess.   POSTOPERATIVE DIAGNOSES:  Probable reactive tissue left shoulder with  rotator cuff tear.   OPERATION:  Exploration left shoulder with extensive soft tissue  debridement, debridement of distal clavicle and repair of rotator cuff  tear.   SURGEON:  Dr. Simonne Come.   ASSISTANT:  Mr. Idolina Primer, [place].   ANESTHESIA:  General.   PATHOLOGY AND JUSTIFICATION FOR PROCEDURE:  She had originally an  arthroscopic subacromial decompression and open distal clavicle  resection with a postoperative infection with incision and drainage by  one of my partners.  She has had recurrent pain, swelling and redness  and has been followed by both me and infectious disease at [facility].  Most recently, she has had a normal sedimentation rate and C-  reactive protein, but an MRI of September 24, 2008, has demonstrated  possibly a soft tissue abscess involving the superior aspect of the  shoulder communicating with the subacromial bursa and the picture was  worrisome for osteomyelitis involving the distal clavicle and acromion  with full-thickness rotator cuff tear.  Despite her normal  inflammatory/infectious studies, she is here today for the above-  mentioned surgery.   PROCEDURE:  She had a PICC line in place and had cephazolin IV prior to  coming to surgery, satisfactory general anesthesia, placed on the  Allen  frame, the left shoulder girdle was prepped with DuraPrep and draped in  a sterile field.  A timeout performed.  I excised the old surgical scar  for distal clavicle excision and opened the incision both medially and  distalward.  In the region of the distal clavicle excision, there was  gray amorphous looking material, but there was no purulence.  I debrided  all this out and this and other tissues we sent as a unit for culture.  Previously, she had grown out staph aureus.  I went down to the clavicle  and opened soft tissue over.  There was a good bit of fibrous tissue  present, but again no purulent material was noted.  I debrided the  terminal end of the clavicle down to bleeding bone.  I then went lateral  ward and opened the fascia over the residual anterior acromion.  Once  again, no purulent material was noted.  The acromion did not appear to  have any bone infection.  I did find a triangular rotator cuff tear with  some maceration around the edges.  The tear had an apex of 1.5 cm and a  base of 1 cm.  After inspecting this and finding no  other tears, I went  ahead and repaired this with side-to-side sutures of #1 Ethibond, in  some cases overlapping the somewhat thinned rotator cuff, but this  seemed to give a very nice stable repair.  Along the way, I irrigated  the wound with double antibiotic solution.  At this point, there  appeared to be no additional soft tissue or bone tissue that needed  debridement.  The soft tissues were infiltrated 1/2% Marcaine with  adrenaline and the wound closed.  I closed the small incision in the  deltoid muscle with interrupted #1 Vicryl, as well as the fascia and  scar over the distal acromion and clavicle with the same.  The  subcutaneous tissue was closed with 2-0 Vicryl.  Steri-Strips on the  skin.  I did not feel that a drain was necessary or indicated.  Dry  sterile dressing, shoulder immobilizer were applied.  She tolerated the   procedure well and was taken to the recovery room in satisfactory  condition with no known complications.  She is scheduled to continue on  her PICC line through Dr. Jarrett Ables direction, infectious disease at  [facility] for another 2 weeks.  I will be following her on a  regular basis following surgery today.           ______________________________  Marlowe Kays, M.D.     JA/MEDQ  D:  10/06/2008  T:  10/06/2008  Job:  [id number]   cc:   Mick Sell, MD

## 2010-06-19 NOTE — Discharge Summary (Signed)
Kathy [name], Kathy [name]             ACCOUNT NO.:  [id number]   MEDICAL RECORD NO.:  [id number]          PATIENT TYPE:  INP   LOCATION:  1618                         FACILITY:  [facility]   PHYSICIAN:  Marlowe Kays, M.D.  DATE OF BIRTH:  1955-05-02   DATE OF ADMISSION:  08/06/2008  DATE OF DISCHARGE:  08/09/2008                               DISCHARGE SUMMARY   ADMITTING DIAGNOSES:  7. A 55 year old, white female with superficial left shoulder      infection, status post left shoulder surgery with shoulder      arthroscopy and distal clavicle resection.  2. Constipation.   DISCHARGE DIAGNOSES:  19. A 55 year old, white female with superficial left shoulder      infection, status post left shoulder surgery with shoulder      arthroscopy and distal clavicle resection.  2. Constipation.   OPERATION:  On August 06, 2008, the patient underwent incision and drainage  to the bone of the left shoulder wound by Dr. Lestine Box.   BRIEF HISTORY:  This 55 year old lady underwent distal clavicle  resection and silicone decompression via scope by Dr. Simonne Come.  She  was seen the day prior to the visit in our office, and she was doing  fairly well at that time but did not feel well.  Later that evening  and during the night, she had increasing pain in the left shoulder and  began running a fever up to well over 100.  Purulent drainage was noted  by her the next day.  She called the office late in the day and was told  to go to the Emergency Room for evaluation.  Dr. Lestine Box was summoned,  and she was eventually taken to the operating room for an incision and  drainage.   COURSE IN THE HOSPITAL:  Dr. Lestine Box placed a drain in the shoulder.  This was removed the second postop day.  Cultures were done at the time  of surgery, and this showed Staphylococcus aureus.  Infectious disease  consult was ordered and given, and after sensitivities were received,  Dr. Ninetta Lights decided that we would go ahead with  Ancef 1 gm IV q.8 h. via  PICC line for 23 days.  Once this was established that we had to correct  antibiotic for the patho-organism, she could be discharged home.   Neurovascularly remained intact in the left upper extremity.  As her  time progressed in the hospital and utilizing vancomycin as an  antibiotic, much of her pain in her left shoulder was markedly reduced.   It was felt that she could be maintained in her home environment with  home health and since PICC line had been inserted, she could use this  for access.  Wound had no tract drainage at the time of discharge.  She  was also afebrile at 97.8.  Laboratory values in the hospital showed  elevated white count of 14.9.  At time of discharge, this had come down  to 6.8.  Blood chemistries remained normal throughout her  hospitalization.  Sed rate was 70.   CONDITION ON DISCHARGE:  Improved, stable.  PLAN:  The patient has her prescriptions for muscle relaxant as well as  analgesics from her surgery.  She will continue with these as needed,  and we recommend she use a stool softener and Milk of Magnesia or  something like that to prevent and treat constipation.  She is to have  Ancef 1 gm IV via PICC line for 23 days.  She has an  appointment to see [place] in the office for June 13.  Prior to discharge,  all questions were encouraged and answered.  She is to use dry dressings  to the shoulder as indicated.  She is encouraged to call should she have  any problems or questions.      Dooley L. Cherlynn June.    ______________________________  Marlowe Kays, M.D.    DLU/MEDQ  D:  08/09/2008  T:  08/09/2008  Job:  [id number]   cc:   Marlowe Kays, M.D.  Fax: [phone]

## 2010-06-22 NOTE — Op Note (Signed)
Kathy [name], HEATHCOCK             ACCOUNT NO.:  [id number]   MEDICAL RECORD NO.:  [id number]          PATIENT TYPE:  AMB   LOCATION:  DAY                          FACILITY:  [facility]   PHYSICIAN:  Marlowe Kays, M.D.  DATE OF BIRTH:  08-14-1955   DATE OF PROCEDURE:  01/02/2006  DATE OF DISCHARGE:                               OPERATIVE REPORT   PREOPERATIVE DIAGNOSES:  1. Degenerative arthritis acromioclavicular joint.  2. Recurrent massive rotator cuff tear right shoulder.   POSTOPERATIVE DIAGNOSES:  1. Degenerative arthritis acromioclavicular joint.  2. Recurrent massive rotator cuff tear right shoulder.   OPERATION:  1. Open distal clavicle resection.  2. Open anterior acromionectomy with repair of massive recurrent      rotator cuff tear supplemented with a DePuy graft jacket, right      shoulder.   SURGEON:  Marlowe Kays, M.D.   ASSISTANT:  Mr. Idolina Primer PA-C   ANESTHESIA:  Interscalene block followed by general anesthesia.   PATHOLOGY AND JUSTIFICATION FOR PROCEDURE:  She had had original surgery  done in [place] 13 months ago with arthroscopic procedure and  arthroscopic repair.  She was subsequently determined by the treating  surgeon down there to have a massive recurrent rotator cuff tear that  was deemed irreparable. Ms. Dern was referred to me by one of my  other patient's.   PROCEDURE:  Interscalene block by Anesthesia prophylactic antibiotics,  satisfactory general anesthesia, beach-chair position on the Allen  frame, right shoulder girdle was prepped with DuraPrep and draped in  sterile field.  Ioban employed.  I made a curved incision over the  distal clavicle angling downward vertically over the rotator cuff.  Incision was first carried down to the distal clavicle where the [facility]  joint was identified.  It was arthritic and disrupted I measured  centimeter and a half from its lateral most aspect, marked the clavicle  at this point and then  undermined it with a small elevator and  protecting the underlying structures with small Bennett retractors,  I  used a micro saw to remove the distal clavicle which I detached and  excised with cautery dissection I checked and were no remaining spicules  of bone I covered the raw bone with bone wax and I then proceeded to the  rotator cuff portion of the procedure, splitting the fascia over the  anterior acromion with cutting cautery and protecting the underneath  surfaces made my initial anterior acromionectomy.  She had significant  impingement problem requiring additional resection of bone which gave  good exposure to the underlying humeral head which was bald.  The long  head of the biceps tendon was intact, on first glance there was no  rotator cuff visible.  I was gradually able, however to identified it  and free it up from the surrounding tissues.  The undersurface of the  acromion was adherent and I freed it up with sharp dissection.  Anteriorly, I freed it up from the overlying muscle tissue until we had  a fairly reasonable flap of rotator cuff which however, was not very  flexible.  This point I  abducted the arm to 90 degrees and used a Mayo  stand for support.  I placed several cross stitches in the rotator cuff  on the head side where there were some linear separations and then  roughened up the humeral head superior to the greater tuberosity.  Fortunately, she did have some remnant of rotator cuff lateral to the  greater tuberosity.  I placed two-pronged rotator cuff anchors on the  most anterior portion and posterior portion of the rotator cuff and was  able to mobilize a portion of it using these anchors and with lateral  tension.  I then supplemented these with multiple interrupted #1  Ethibond suture through the remnant of the rotator cuff lateral ward.  This left about a thumb nail size defect remaining.  After tying down  the suture anchors,  I then placed a DePuy  graft jacket folded triply on  itself and tacked it down around the intact rotator cuff around the  perimeter with 2-0 Vicryl.  This seemed to give a nice support both to  fill the gap and also to support the previous repair.  I then placed  some Gelfoam down on top of the patch and the repair and also Gelfoam  was packed in the [facility] joint resection site.  I preceded this by  irrigating the wound well with sterile saline.  We then closed the wound  with interrupted #1 Vicryl and the fascia on top of the distal clavicle  and resection site and also the rotator cuff resection site and slight  split in the deltoid muscle.  2-0 Vicryl in the subcutaneous tissue.  Steri-Strips on the skin.  Dry sterile dressing was applied and I held  the arm at 90 degrees while we applied a prefab 90 degree abduction  brace with plans to postoperatively order a commercial brace that can be  gradually and incrementally decreased in abduction.  She tolerated the  procedure well was taken to the recovery in satisfactory condition with  no known complications.           ______________________________  Marlowe Kays, M.D.     JA/MEDQ  D:  01/02/2006  T:  01/02/2006  Job:  [id number]

## 2010-06-22 NOTE — Op Note (Signed)
[name], [name]             ACCOUNT NO.:  [id number]   MEDICAL RECORD NO.:  [id number]          PATIENT TYPE:  OIB   LOCATION:  1505                         FACILITY:  [facility]   PHYSICIAN:  Marlowe Kays, M.D.  DATE OF BIRTH:  1955/07/11   DATE OF PROCEDURE:  04/04/2006  DATE OF DISCHARGE:                               OPERATIVE REPORT   PREOPERATIVE DIAGNOSIS:  Recurrent rotator cuff tear right shoulder.   POSTOP DIAGNOSIS:  Recurrent rotator cuff tear right shoulder.   OPERATIONS:  1. Anterior acromionectomy.  2. Repair recurrent rotator cuff tear, right shoulder.   SURGEON:  Marlowe Kays, M.D.   ASSISTANT:  Georges Lynch. Darrelyn Hillock, M.D.   ANESTHESIA:  General.   DESCRIPTION FOR PROCEDURE:  She initially had been treated with surgery  elsewhere; I re-repaired almost an irrepairable rotator cuff in November  of last year.  She has been in a brace and on gentle physical therapy,  but recently has developed some pain in the rotator cuff; and on  arthrogram has a tear which seems to come out underneath the anterior  portion of the rotator cuff, beneath the residual acromion.  Accordingly, she is here today for surgery.   Prophylactic antibiotic, satisfactory general anesthesia, beach-chair  position on the Allen frame, right shoulder girdle was prepped with  DuraPrep and draped in sterile field; Ioban employed.  She had a  preoperative interscalene block which went through the old surgical  incision; and opened the fascia over the residual anterior acromion in  line with the skin incision.  The deltoid was slightly split.  With a  small Cobb elevator, I undermined the residual acromion.  She had a 2-cm  recurrent tear in the anterior portion of the rotator cuff, just where  the arthrogram had indicated.  The remainder of the rotator cuff appears  to have reconstituted itself very nicely.   With finger dissection, I dissected the rotator cuff off the  undersurface of the  acromion.  She had significant adhesions.  We  inspected the rotator cuff, and found no evidence for any other tear.  I  repaired the visible slit with multiple interrupted #1 Ethibond suture;  and then used a graft jacket over the entire rotator cuff to bolster it  tacking it down with multiple interrupted 3-0 Vicryl.  I then pie  crusted it to allow egress of fluid; and placed some Gelfoam on top to  assist in compression and stabilization.  The wound was irrigated with  sterile saline and the fascia over the  anterior acromion and the deltoid were reapproximated with interrupted  #1 Vicryl, subcutaneous tissue with 2-0 Vicryl, skin with Steri-Strips.  Dry sterile dressing and shoulder immobilizer applied.  She tolerated  the procedure well; and was taken to the recovery room in satisfactory  condition with no known complications.           ______________________________  Marlowe Kays, M.D.     JA/MEDQ  D:  04/04/2006  T:  04/04/2006  Job:  [id number]

## 2010-11-09 LAB — CBC
Hemoglobin: 14.6 g/dL (ref 12.0–15.0)
MCHC: 34.1 g/dL (ref 30.0–36.0)
MCV: 87.6 fL (ref 78.0–100.0)
RBC: 4.88 MIL/uL (ref 3.87–5.11)

## 2010-11-16 LAB — APTT: aPTT: 29

## 2010-11-16 LAB — PROTIME-INR: INR: 1

## 2010-11-16 LAB — HEMOGLOBIN AND HEMATOCRIT, BLOOD: HCT: 40

## 2011-03-15 ENCOUNTER — Other Ambulatory Visit: Payer: Self-pay | Admitting: Orthopedic Surgery

## 2011-03-15 DIAGNOSIS — M25511 Pain in right shoulder: Secondary | ICD-10-CM

## 2011-03-22 ENCOUNTER — Ambulatory Visit
Admission: RE | Admit: 2011-03-22 | Discharge: 2011-03-22 | Disposition: A | Discharge status: Home / Self Care | Payer: Medicare Other | Source: Ambulatory Visit | Attending: Orthopedic Surgery | Admitting: Orthopedic Surgery

## 2011-03-22 DIAGNOSIS — M25512 Pain in left shoulder: Secondary | ICD-10-CM

## 2011-08-29 ENCOUNTER — Other Ambulatory Visit: Payer: Self-pay | Admitting: Neurosurgery

## 2011-08-29 DIAGNOSIS — M47817 Spondylosis without myelopathy or radiculopathy, lumbosacral region: Secondary | ICD-10-CM

## 2011-08-29 DIAGNOSIS — M5137 Other intervertebral disc degeneration, lumbosacral region: Secondary | ICD-10-CM

## 2011-08-31 ENCOUNTER — Ambulatory Visit
Admission: RE | Admit: 2011-08-31 | Discharge: 2011-08-31 | Disposition: A | Discharge status: Home / Self Care | Payer: Medicare Other | Source: Ambulatory Visit | Attending: Neurosurgery | Admitting: Neurosurgery

## 2011-08-31 DIAGNOSIS — M47817 Spondylosis without myelopathy or radiculopathy, lumbosacral region: Secondary | ICD-10-CM

## 2011-08-31 DIAGNOSIS — M5137 Other intervertebral disc degeneration, lumbosacral region: Secondary | ICD-10-CM

## 2011-12-09 ENCOUNTER — Other Ambulatory Visit: Payer: Self-pay | Admitting: Neurosurgery

## 2011-12-09 DIAGNOSIS — M542 Cervicalgia: Secondary | ICD-10-CM

## 2011-12-09 DIAGNOSIS — M5020 Other cervical disc displacement, unspecified cervical region: Secondary | ICD-10-CM

## 2011-12-09 DIAGNOSIS — M47812 Spondylosis without myelopathy or radiculopathy, cervical region: Secondary | ICD-10-CM

## 2011-12-09 DIAGNOSIS — M48061 Spinal stenosis, lumbar region without neurogenic claudication: Secondary | ICD-10-CM

## 2011-12-17 ENCOUNTER — Ambulatory Visit
Admission: RE | Admit: 2011-12-17 | Discharge: 2011-12-17 | Disposition: A | Discharge status: Home / Self Care | Payer: Medicare Other | Source: Ambulatory Visit | Attending: Neurosurgery | Admitting: Neurosurgery

## 2011-12-17 DIAGNOSIS — M5020 Other cervical disc displacement, unspecified cervical region: Secondary | ICD-10-CM

## 2011-12-17 DIAGNOSIS — M48061 Spinal stenosis, lumbar region without neurogenic claudication: Secondary | ICD-10-CM

## 2011-12-17 DIAGNOSIS — M47812 Spondylosis without myelopathy or radiculopathy, cervical region: Secondary | ICD-10-CM

## 2011-12-17 DIAGNOSIS — M542 Cervicalgia: Secondary | ICD-10-CM

## 2012-01-08 ENCOUNTER — Other Ambulatory Visit: Payer: Self-pay | Admitting: Neurosurgery

## 2012-02-04 ENCOUNTER — Encounter (HOSPITAL_COMMUNITY): Payer: Self-pay | Admitting: Pharmacy Technician

## 2012-02-11 ENCOUNTER — Encounter (HOSPITAL_COMMUNITY): Payer: Self-pay

## 2012-02-11 ENCOUNTER — Encounter (HOSPITAL_COMMUNITY)
Admission: RE | Admit: 2012-02-11 | Discharge: 2012-02-11 | Disposition: A | Discharge status: Home / Self Care | Payer: Medicare Other | Source: Ambulatory Visit | Attending: Neurosurgery | Admitting: Neurosurgery

## 2012-02-11 HISTORY — DX: Unspecified osteoarthritis, unspecified site: M19.90

## 2012-02-11 HISTORY — DX: Other specified postprocedural states: Z98.890

## 2012-02-11 HISTORY — DX: Nausea with vomiting, unspecified: R11.2

## 2012-02-11 HISTORY — DX: Depression, unspecified: F32.A

## 2012-02-11 HISTORY — DX: Cardiac murmur, unspecified: R01.1

## 2012-02-11 HISTORY — DX: Major depressive disorder, single episode, unspecified: F32.9

## 2012-02-11 HISTORY — DX: Nocturia: R35.1

## 2012-02-11 HISTORY — DX: Constipation, unspecified: K59.00

## 2012-02-11 HISTORY — DX: Shortness of breath: R06.02

## 2012-02-11 LAB — CBC
HCT: 40.2 % (ref 36.0–46.0)
Hemoglobin: 14.2 g/dL (ref 12.0–15.0)
MCHC: 35.3 g/dL (ref 30.0–36.0)
WBC: 7.6 10*3/uL (ref 4.0–10.5)

## 2012-02-11 LAB — SURGICAL PCR SCREEN
MRSA, PCR: NEGATIVE
Staphylococcus aureus: NEGATIVE

## 2012-02-11 NOTE — Pre-Procedure Instructions (Signed)
[address] [place]  02/11/2012   Your procedure is scheduled on: Monday, January 13th.  Report to [facility] at 5:30 AM.  Call this number if you have problems the morning of surgery: [phone]   Remember: Nothing to eat or drink after Midnight.     Take these medicines the morning of surgery with A SIP OF WATER: Estradiol (Estrace), Fluoxetine (Prozac).  Stop taking Aspirin, Coumadin, Plavix, Effient and Herbal medications.  Don not take any NSAIDs ie: Ibuprofen,  Advil,Naproxen or any medication containing Aspirin.   Do not wear jewelry, make-up or nail polish.  Do not wear lotions, powders, or perfumes. You may wear deodorant.  Do not shave 48 hours prior to surgery. Men may shave face and neck.  Do not bring valuables to the hospital.  Contacts, dentures or bridgework may not be worn into surgery.  Leave suitcase in the car. After surgery it may be brought to your room.  For patients admitted to the hospital, checkout time is 11:00 AM the day of discharge.   Patients discharged the day of surgery will not be allowed to drive home.  Name and phone number of your driver: NA    Special Instructions: Shower using CHG 2 nights before surgery and the night before surgery.  If you shower the day of surgery use CHG.  Use special wash - you have one bottle of CHG for all showers.  You should use approximately 1/3 of the bottle for each shower.   Please read over the following fact sheets that you were given: Pain Booklet, Coughing and Deep Breathing and Surgical Site Infection Prevention

## 2012-02-11 NOTE — Progress Notes (Signed)
Pt has a history of a heart murmer,  Was seen in [place], [place] at [facility]iology .  I faxed a request to Cardiology requesting office notes and ECHO.

## 2012-02-14 MED ORDER — MUPIROCIN 2 % EX OINT
TOPICAL_OINTMENT | CUTANEOUS | Status: AC
  Filled 2012-02-14: qty 22

## 2012-02-16 MED ORDER — CEFAZOLIN SODIUM-DEXTROSE 2-3 GM-% IV SOLR
2.0000 g | INTRAVENOUS | Status: AC
  Administered 2012-02-17: 2 g via INTRAVENOUS
  Filled 2012-02-16: qty 50

## 2012-02-17 ENCOUNTER — Observation Stay (HOSPITAL_COMMUNITY): Payer: Medicare Other

## 2012-02-17 ENCOUNTER — Ambulatory Visit (HOSPITAL_COMMUNITY)
Admission: RE | Admit: 2012-02-17 | Discharge: 2012-02-18 | Disposition: A | Discharge status: Home / Self Care | Payer: Medicare Other | Source: Ambulatory Visit | Attending: Neurosurgery | Admitting: Neurosurgery

## 2012-02-17 ENCOUNTER — Encounter (HOSPITAL_COMMUNITY)
Admission: RE | Disposition: A | Discharge status: Home / Self Care | Payer: Self-pay | Source: Ambulatory Visit | Attending: Neurosurgery

## 2012-02-17 ENCOUNTER — Encounter (HOSPITAL_COMMUNITY): Payer: Self-pay | Admitting: Certified Registered Nurse Anesthetist

## 2012-02-17 ENCOUNTER — Ambulatory Visit (HOSPITAL_COMMUNITY): Payer: Medicare Other | Admitting: Certified Registered Nurse Anesthetist

## 2012-02-17 DIAGNOSIS — Z01812 Encounter for preprocedural laboratory examination: Secondary | ICD-10-CM | POA: Insufficient documentation

## 2012-02-17 DIAGNOSIS — M542 Cervicalgia: Secondary | ICD-10-CM | POA: Insufficient documentation

## 2012-02-17 DIAGNOSIS — T8489XA Other specified complication of internal orthopedic prosthetic devices, implants and grafts, initial encounter: Secondary | ICD-10-CM | POA: Insufficient documentation

## 2012-02-17 DIAGNOSIS — Y831 Surgical operation with implant of artificial internal device as the cause of abnormal reaction of the patient, or of later complication, without mention of misadventure at the time of the procedure: Secondary | ICD-10-CM | POA: Insufficient documentation

## 2012-02-17 HISTORY — PX: POSTERIOR CERVICAL FUSION/FORAMINOTOMY: SHX5038

## 2012-02-17 SURGERY — POSTERIOR CERVICAL FUSION/FORAMINOTOMY LEVEL 2
Anesthesia: General | Wound class: Clean

## 2012-02-17 MED ORDER — BUPIVACAINE HCL (PF) 0.5 % IJ SOLN
INTRAMUSCULAR | Status: DC | PRN
  Administered 2012-02-17: 11 mL

## 2012-02-17 MED ORDER — OXYCODONE HCL 5 MG PO TABS: 5.0000 mg | ORAL_TABLET | Freq: Once | ORAL | Status: DC | PRN

## 2012-02-17 MED ORDER — ALBUMIN HUMAN 5 % IV SOLN
INTRAVENOUS | Status: DC | PRN
  Administered 2012-02-17: 09:00:00 via INTRAVENOUS

## 2012-02-17 MED ORDER — BACITRACIN 50000 UNITS IM SOLR
INTRAMUSCULAR | Status: AC
  Filled 2012-02-17: qty 1

## 2012-02-17 MED ORDER — NEOSTIGMINE METHYLSULFATE 1 MG/ML IJ SOLN
INTRAMUSCULAR | Status: DC | PRN
  Administered 2012-02-17: 4 mg via INTRAVENOUS

## 2012-02-17 MED ORDER — FLUOXETINE HCL 40 MG PO CAPS: 40.0000 mg | ORAL_CAPSULE | Freq: Every day | ORAL | Status: DC

## 2012-02-17 MED ORDER — ESTRADIOL 1 MG PO TABS
0.5000 mg | ORAL_TABLET | Freq: Every day | ORAL | Status: DC
  Administered 2012-02-17: 0.5 mg via ORAL
  Filled 2012-02-17 (×2): qty 0.5

## 2012-02-17 MED ORDER — KCL IN DEXTROSE-NACL 20-5-0.45 MEQ/L-%-% IV SOLN
INTRAVENOUS | Status: AC
  Filled 2012-02-17: qty 1000

## 2012-02-17 MED ORDER — LIDOCAINE HCL (CARDIAC) 20 MG/ML IV SOLN
INTRAVENOUS | Status: DC | PRN
  Administered 2012-02-17: 70 mg via INTRAVENOUS

## 2012-02-17 MED ORDER — SODIUM CHLORIDE 0.9 % IR SOLN
Status: DC | PRN
  Administered 2012-02-17: 08:00:00

## 2012-02-17 MED ORDER — MENTHOL 3 MG MT LOZG: 1.0000 | LOZENGE | OROMUCOSAL | Status: DC | PRN

## 2012-02-17 MED ORDER — ACETAMINOPHEN 10 MG/ML IV SOLN
1000.0000 mg | Freq: Four times a day (QID) | INTRAVENOUS | Status: AC
  Administered 2012-02-17 – 2012-02-18 (×4): 1000 mg via INTRAVENOUS
  Filled 2012-02-17 (×4): qty 100

## 2012-02-17 MED ORDER — SODIUM CHLORIDE 0.9 % IV SOLN: 250.0000 mL | INTRAVENOUS | Status: DC

## 2012-02-17 MED ORDER — ZOLPIDEM TARTRATE 5 MG PO TABS: 5.0000 mg | ORAL_TABLET | Freq: Every evening | ORAL | Status: DC | PRN

## 2012-02-17 MED ORDER — LACTATED RINGERS IV SOLN
INTRAVENOUS | Status: DC | PRN
  Administered 2012-02-17 (×2): via INTRAVENOUS

## 2012-02-17 MED ORDER — BACITRACIN ZINC 500 UNIT/GM EX OINT
TOPICAL_OINTMENT | CUTANEOUS | Status: DC | PRN
  Administered 2012-02-17: 1 via TOPICAL

## 2012-02-17 MED ORDER — MORPHINE SULFATE 4 MG/ML IJ SOLN
4.0000 mg | INTRAMUSCULAR | Status: DC | PRN
  Administered 2012-02-17: 4 mg via INTRAMUSCULAR
  Filled 2012-02-17: qty 1

## 2012-02-17 MED ORDER — ONDANSETRON HCL 4 MG/2ML IJ SOLN
INTRAMUSCULAR | Status: DC | PRN
  Administered 2012-02-17: 4 mg via INTRAVENOUS

## 2012-02-17 MED ORDER — PROPOFOL 10 MG/ML IV BOLUS
INTRAVENOUS | Status: DC | PRN
  Administered 2012-02-17: 160 mg via INTRAVENOUS

## 2012-02-17 MED ORDER — 0.9 % SODIUM CHLORIDE (POUR BTL) OPTIME
TOPICAL | Status: DC | PRN
  Administered 2012-02-17: 1000 mL

## 2012-02-17 MED ORDER — THROMBIN 20000 UNITS EX SOLR
CUTANEOUS | Status: DC | PRN
  Administered 2012-02-17: 08:00:00 via TOPICAL

## 2012-02-17 MED ORDER — ALUM & MAG HYDROXIDE-SIMETH 200-200-20 MG/5ML PO SUSP: 30.0000 mL | Freq: Four times a day (QID) | ORAL | Status: DC | PRN

## 2012-02-17 MED ORDER — GLYCOPYRROLATE 0.2 MG/ML IJ SOLN
INTRAMUSCULAR | Status: DC | PRN
  Administered 2012-02-17: .6 mg via INTRAVENOUS

## 2012-02-17 MED ORDER — KETOROLAC TROMETHAMINE 30 MG/ML IJ SOLN
INTRAMUSCULAR | Status: AC
  Filled 2012-02-17: qty 1

## 2012-02-17 MED ORDER — ACETAMINOPHEN 650 MG RE SUPP: 650.0000 mg | RECTAL | Status: DC | PRN

## 2012-02-17 MED ORDER — EPHEDRINE SULFATE 50 MG/ML IJ SOLN
INTRAMUSCULAR | Status: DC | PRN
  Administered 2012-02-17 (×2): 10 mg via INTRAVENOUS
  Administered 2012-02-17: 15 mg via INTRAVENOUS
  Administered 2012-02-17: 5 mg via INTRAVENOUS
  Administered 2012-02-17: 10 mg via INTRAVENOUS

## 2012-02-17 MED ORDER — BISACODYL 10 MG RE SUPP: 10.0000 mg | Freq: Every day | RECTAL | Status: DC | PRN

## 2012-02-17 MED ORDER — HYDROMORPHONE HCL PF 1 MG/ML IJ SOLN
INTRAMUSCULAR | Status: AC
  Filled 2012-02-17: qty 1

## 2012-02-17 MED ORDER — OXYCODONE HCL 5 MG PO TABS
5.0000 mg | ORAL_TABLET | ORAL | Status: DC | PRN
  Administered 2012-02-17 – 2012-02-18 (×3): 10 mg via ORAL
  Filled 2012-02-17 (×3): qty 2

## 2012-02-17 MED ORDER — FENTANYL CITRATE 0.05 MG/ML IJ SOLN
INTRAMUSCULAR | Status: DC | PRN
  Administered 2012-02-17 (×3): 50 ug via INTRAVENOUS
  Administered 2012-02-17: 100 ug via INTRAVENOUS

## 2012-02-17 MED ORDER — KETOROLAC TROMETHAMINE 30 MG/ML IJ SOLN
30.0000 mg | Freq: Four times a day (QID) | INTRAMUSCULAR | Status: DC
  Administered 2012-02-17 – 2012-02-18 (×3): 30 mg via INTRAVENOUS
  Filled 2012-02-17 (×8): qty 1

## 2012-02-17 MED ORDER — DIAZEPAM 5 MG/ML IJ SOLN
INTRAMUSCULAR | Status: AC
  Administered 2012-02-17: 2.5 mg via INTRAVENOUS
  Filled 2012-02-17: qty 2

## 2012-02-17 MED ORDER — MAGNESIUM HYDROXIDE 400 MG/5ML PO SUSP: 30.0000 mL | Freq: Every day | ORAL | Status: DC | PRN

## 2012-02-17 MED ORDER — KETOROLAC TROMETHAMINE 30 MG/ML IJ SOLN
30.0000 mg | Freq: Once | INTRAMUSCULAR | Status: AC
  Administered 2012-02-17: 30 mg via INTRAVENOUS

## 2012-02-17 MED ORDER — ONDANSETRON HCL 4 MG/2ML IJ SOLN
4.0000 mg | Freq: Four times a day (QID) | INTRAMUSCULAR | Status: AC | PRN
  Administered 2012-02-17: 4 mg via INTRAVENOUS

## 2012-02-17 MED ORDER — OXYCODONE HCL 5 MG/5ML PO SOLN: 5.0000 mg | Freq: Once | ORAL | Status: DC | PRN

## 2012-02-17 MED ORDER — SODIUM CHLORIDE 0.9 % IJ SOLN
3.0000 mL | Freq: Two times a day (BID) | INTRAMUSCULAR | Status: DC
  Administered 2012-02-17: 3 mL via INTRAVENOUS

## 2012-02-17 MED ORDER — HYDROXYZINE HCL 50 MG/ML IM SOLN: 50.0000 mg | INTRAMUSCULAR | Status: DC | PRN

## 2012-02-17 MED ORDER — KCL IN DEXTROSE-NACL 20-5-0.45 MEQ/L-%-% IV SOLN
INTRAVENOUS | Status: DC
  Administered 2012-02-17: 10:00:00 via INTRAVENOUS
  Filled 2012-02-17 (×5): qty 1000

## 2012-02-17 MED ORDER — ROCURONIUM BROMIDE 100 MG/10ML IV SOLN
INTRAVENOUS | Status: DC | PRN
  Administered 2012-02-17: 50 mg via INTRAVENOUS

## 2012-02-17 MED ORDER — HYDROXYZINE HCL 25 MG PO TABS: 50.0000 mg | ORAL_TABLET | ORAL | Status: DC | PRN

## 2012-02-17 MED ORDER — PHENOL 1.4 % MT LIQD: 1.0000 | OROMUCOSAL | Status: DC | PRN

## 2012-02-17 MED ORDER — LIDOCAINE-EPINEPHRINE 1 %-1:100000 IJ SOLN
INTRAMUSCULAR | Status: DC | PRN
  Administered 2012-02-17: 11 mL

## 2012-02-17 MED ORDER — ACETAMINOPHEN 10 MG/ML IV SOLN
INTRAVENOUS | Status: AC
  Filled 2012-02-17: qty 100

## 2012-02-17 MED ORDER — ACETAMINOPHEN 325 MG PO TABS: 650.0000 mg | ORAL_TABLET | ORAL | Status: DC | PRN

## 2012-02-17 MED ORDER — CYCLOBENZAPRINE HCL 10 MG PO TABS
10.0000 mg | ORAL_TABLET | Freq: Three times a day (TID) | ORAL | Status: DC | PRN
  Administered 2012-02-17 – 2012-02-18 (×2): 10 mg via ORAL
  Filled 2012-02-17 (×2): qty 1

## 2012-02-17 MED ORDER — ONDANSETRON HCL 4 MG/2ML IJ SOLN
INTRAMUSCULAR | Status: AC
  Filled 2012-02-17: qty 2

## 2012-02-17 MED ORDER — PHENYLEPHRINE HCL 10 MG/ML IJ SOLN
INTRAMUSCULAR | Status: DC | PRN
  Administered 2012-02-17 (×2): 80 ug via INTRAVENOUS
  Administered 2012-02-17: 120 ug via INTRAVENOUS
  Administered 2012-02-17 (×4): 80 ug via INTRAVENOUS

## 2012-02-17 MED ORDER — SODIUM CHLORIDE 0.9 % IJ SOLN: 3.0000 mL | INTRAMUSCULAR | Status: DC | PRN

## 2012-02-17 MED ORDER — HEMOSTATIC AGENTS (NO CHARGE) OPTIME
TOPICAL | Status: DC | PRN
  Administered 2012-02-17: 1 via TOPICAL

## 2012-02-17 MED ORDER — DIAZEPAM 5 MG/ML IJ SOLN
2.5000 mg | Freq: Once | INTRAMUSCULAR | Status: AC
  Administered 2012-02-17: 2.5 mg via INTRAVENOUS

## 2012-02-17 MED ORDER — FLUOXETINE HCL 20 MG PO CAPS
40.0000 mg | ORAL_CAPSULE | Freq: Every day | ORAL | Status: DC
  Administered 2012-02-17: 40 mg via ORAL
  Filled 2012-02-17 (×2): qty 2

## 2012-02-17 MED ORDER — MIDAZOLAM HCL 5 MG/5ML IJ SOLN
INTRAMUSCULAR | Status: DC | PRN
  Administered 2012-02-17: 2 mg via INTRAVENOUS

## 2012-02-17 MED ORDER — SODIUM CHLORIDE 0.9 % IV SOLN
INTRAVENOUS | Status: AC
  Filled 2012-02-17: qty 500

## 2012-02-17 MED ORDER — HYDROMORPHONE HCL PF 1 MG/ML IJ SOLN
0.2500 mg | INTRAMUSCULAR | Status: DC | PRN
  Administered 2012-02-17 (×4): 0.5 mg via INTRAVENOUS

## 2012-02-17 SURGICAL SUPPLY — 68 items
BAG DECANTER FOR FLEXI CONT (MISCELLANEOUS) ×2 IMPLANT
BIT DRILL NEURO 2X3.1 SFT TUCH (MISCELLANEOUS) ×1 IMPLANT
BIT DRILL WIRE PASS 1.3MM (BIT) IMPLANT
BLADE SURG 11 STRL SS (BLADE) ×2 IMPLANT
BLADE SURG ROTATE 9660 (MISCELLANEOUS) ×2 IMPLANT
BRUSH SCRUB EZ PLAIN DRY (MISCELLANEOUS) ×2 IMPLANT
CANISTER SUCTION 2500CC (MISCELLANEOUS) ×2 IMPLANT
CLOTH BEACON ORANGE TIMEOUT ST (SAFETY) ×2 IMPLANT
CONT SPEC 4OZ CLIKSEAL STRL BL (MISCELLANEOUS) ×2 IMPLANT
COVER TABLE BACK 60X90 (DRAPES) ×2 IMPLANT
DECANTER SPIKE VIAL GLASS SM (MISCELLANEOUS) ×2 IMPLANT
DERMABOND ADVANCED (GAUZE/BANDAGES/DRESSINGS) ×1
DERMABOND ADVANCED .7 DNX12 (GAUZE/BANDAGES/DRESSINGS) ×1 IMPLANT
DRAPE C-ARM 42X72 X-RAY (DRAPES) ×4 IMPLANT
DRAPE LAPAROTOMY 100X72 PEDS (DRAPES) ×2 IMPLANT
DRAPE POUCH INSTRU U-SHP 10X18 (DRAPES) ×2 IMPLANT
DRAPE PROXIMA HALF (DRAPES) IMPLANT
DRILL NEURO 2X3.1 SOFT TOUCH (MISCELLANEOUS) ×2
DRILL WIRE PASS 1.3MM (BIT)
DRSG EMULSION OIL 3X3 NADH (GAUZE/BANDAGES/DRESSINGS) IMPLANT
ELECT REM PT RETURN 9FT ADLT (ELECTROSURGICAL) ×2
ELECTRODE REM PT RTRN 9FT ADLT (ELECTROSURGICAL) ×1 IMPLANT
EVACUATOR 1/8 PVC DRAIN (DRAIN) IMPLANT
GAUZE SPONGE 4X4 16PLY XRAY LF (GAUZE/BANDAGES/DRESSINGS) ×2 IMPLANT
GLOVE BIO SURGEON STRL SZ8 (GLOVE) ×2 IMPLANT
GLOVE BIOGEL PI IND STRL 6.5 (GLOVE) ×3 IMPLANT
GLOVE BIOGEL PI IND STRL 8 (GLOVE) ×1 IMPLANT
GLOVE BIOGEL PI INDICATOR 6.5 (GLOVE) ×3
GLOVE BIOGEL PI INDICATOR 8 (GLOVE) ×1
GLOVE ECLIPSE 7.5 STRL STRAW (GLOVE) ×2 IMPLANT
GLOVE EXAM NITRILE LRG STRL (GLOVE) IMPLANT
GLOVE EXAM NITRILE MD LF STRL (GLOVE) ×2 IMPLANT
GLOVE EXAM NITRILE XL STR (GLOVE) IMPLANT
GLOVE EXAM NITRILE XS STR PU (GLOVE) IMPLANT
GLOVE INDICATOR 7.0 STRL GRN (GLOVE) ×4 IMPLANT
GLOVE INDICATOR 8.5 STRL (GLOVE) ×2 IMPLANT
GOWN BRE IMP SLV AUR LG STRL (GOWN DISPOSABLE) IMPLANT
GOWN BRE IMP SLV AUR XL STRL (GOWN DISPOSABLE) ×2 IMPLANT
GOWN STRL REIN 2XL LVL4 (GOWN DISPOSABLE) IMPLANT
HEMOSTAT SURGICEL 2X14 (HEMOSTASIS) IMPLANT
KIT BASIN OR (CUSTOM PROCEDURE TRAY) ×2 IMPLANT
KIT INFUSE XX SMALL 0.7CC (Orthopedic Implant) ×2 IMPLANT
KIT ROOM TURNOVER OR (KITS) ×2 IMPLANT
NEEDLE SPNL 18GX3.5 QUINCKE PK (NEEDLE) ×2 IMPLANT
NEEDLE SPNL 22GX3.5 QUINCKE BK (NEEDLE) ×4 IMPLANT
NS IRRIG 1000ML POUR BTL (IV SOLUTION) ×2 IMPLANT
PACK LAMINECTOMY NEURO (CUSTOM PROCEDURE TRAY) ×2 IMPLANT
PAD ARMBOARD 7.5X6 YLW CONV (MISCELLANEOUS) ×6 IMPLANT
PIN MAYFIELD SKULL DISP (PIN) ×2 IMPLANT
ROD VUEPOINT 3.5X60 (Rod) ×2 IMPLANT
ROD VUEPOINT 80MM (Rod) ×2 IMPLANT
SCREW MA MM 3.5X14 (Screw) ×12 IMPLANT
SCREW SET THREADED (Screw) ×12 IMPLANT
SPONGE GAUZE 4X4 12PLY (GAUZE/BANDAGES/DRESSINGS) ×4 IMPLANT
SPONGE LAP 4X18 X RAY DECT (DISPOSABLE) IMPLANT
SPONGE SURGIFOAM ABS GEL 100 (HEMOSTASIS) ×2 IMPLANT
STAPLER SKIN PROX WIDE 3.9 (STAPLE) ×2 IMPLANT
STRIP BIOACTIVE VITOSS 25X52X4 (Orthopedic Implant) ×2 IMPLANT
SUT ETHILON 3 0 FSL (SUTURE) IMPLANT
SUT VIC AB 0 CT1 18XCR BRD8 (SUTURE) ×1 IMPLANT
SUT VIC AB 0 CT1 8-18 (SUTURE) ×1
SUT VIC AB 2-0 CP2 18 (SUTURE) ×2 IMPLANT
SYR 20ML ECCENTRIC (SYRINGE) ×2 IMPLANT
TOWEL OR 17X24 6PK STRL BLUE (TOWEL DISPOSABLE) ×2 IMPLANT
TOWEL OR 17X26 10 PK STRL BLUE (TOWEL DISPOSABLE) ×2 IMPLANT
TRAY FOLEY CATH 14FRSI W/METER (CATHETERS) IMPLANT
UNDERPAD 30X30 INCONTINENT (UNDERPADS AND DIAPERS) IMPLANT
WATER STERILE IRR 1000ML POUR (IV SOLUTION) ×2 IMPLANT

## 2012-02-17 NOTE — Anesthesia Postprocedure Evaluation (Signed)
Anesthesia Post Note  Patient: Kathy [name]  Procedure(s) Performed: Procedure(s) (LRB): POSTERIOR CERVICAL FUSION/FORAMINOTOMY LEVEL 2 (N/A)  Anesthesia type: General  Patient location: PACU  Post pain: Pain level controlled and Adequate analgesia  Post assessment: Post-op Vital signs reviewed, Patient's Cardiovascular Status Stable, Respiratory Function Stable, Patent Airway and Pain level controlled  Last Vitals:  Filed Vitals:   02/17/12 1040  BP:   Pulse: 79  Temp:   Resp: 19    Post vital signs: Reviewed and stable  Level of consciousness: awake, alert  and oriented  Complications: No apparent anesthesia complications

## 2012-02-17 NOTE — Preoperative (Signed)
Beta Blockers   Reason not to administer Beta Blockers:Not Applicable 

## 2012-02-17 NOTE — Transfer of Care (Signed)
Immediate Anesthesia Transfer of Care Note  Patient: Kathy [name]  Procedure(s) Performed: Procedure(s) (LRB) with comments: POSTERIOR CERVICAL FUSION/FORAMINOTOMY LEVEL 2 (N/A) - Cervical five to seven posterior arthrodesis  Patient Location: PACU  Anesthesia Type:General  Level of Consciousness: awake, alert , oriented and patient cooperative  Airway & Oxygen Therapy: Patient Spontanous Breathing and Patient connected to nasal cannula oxygen  Post-op Assessment: Report given to PACU RN, Post -op Vital signs reviewed and stable and Patient moving all extremities X 4  Post vital signs: Reviewed and stable  Complications: No apparent anesthesia complications

## 2012-02-17 NOTE — Progress Notes (Signed)
Filed Vitals:   02/17/12 1151 02/17/12 1219 02/17/12 1721 02/17/12 2000  BP:  113/81 127/76 112/70  Pulse: 78 75 74 85  Temp: 97 F (36.1 C) 96.2 F (35.7 C) 97 F (36.1 C) 97.7 F (36.5 C)  TempSrc:  Axillary    Resp: 18 16 16 18   SpO2: 96% 96% 93% 94%    Patient resting in bed, has had moderate pain and discomfort. Has been receiving Tylenol IV, Toradol IV, morphine IM, OxyIR by mouth, and Flexeril by mouth. Dressing is clean and dry. Has been up and ambulating in halls. Has voided.  Plan: Significant postoperative pain. Encouraged to ambulate.  Hewitt Shorts, MD 02/17/2012, 8:24 PM

## 2012-02-17 NOTE — Op Note (Signed)
02/17/2012  9:53 AM  PATIENT:  Kathy [name]  57 y.o. female  PRE-OPERATIVE DIAGNOSIS:  pseudoarthrosis  neck pain  POST-OPERATIVE DIAGNOSIS:  pseudoarthrosis  neck pain  PROCEDURE:  Procedure(s): POSTERIOR CERVICAL FUSION/FORAMINOTOMY 3 LEVEL:  C5-C7 posterior cervical arthrodesis with viewpoint lateral mass screws and rods and Vitoss BA and infuse  SURGEON:  Surgeon(s): Hewitt Shorts, MD Mariam Dollar, MD  ASSISTANTS: Donalee Citrin, M.D.  ANESTHESIA:   general  EBL:  Total I/O In: 1250 [I.V.:1000; IV Piggyback:250] Out: 50 [Blood:50]  BLOOD ADMINISTERED:none  COUNT: Correct per nursing staff   DICTATION:   Patient is brought to the operating room, placed under general endotracheal anesthesia. Patient was placed in a 3 pin Mayfield head holder. Patient was turned to a prone position. The suboccipital region, posterior neck, and upper back were prepped with Betadine soap and solution and draped in a sterile fashion. The C-arm fluoroscope was brought in the field, and we localized the C5-7 levels. The midline skin and subcutaneous tissue were infiltrated local anesthetic with epinephrine. Midline incision made carried down to subcutaneous tissue. Bipolar cautery left cautery is maintain hemostasis. Dissection was carried down to the posterior cervical fascia was incised bilaterally and the paracervical musculature was dissected from the spinous processes and lamina in a subperiosteal fashion. We then proceeded with the arthrodesis, using the C-arm fluoroscope to help guide placement of the lateral mass screws. The lateral masses C5, C6, and C7 were identified. Pilot holes were made with the high-speed drill in the lateral masses, and then screw holes were drilled and a superolateral trajectory into the lateral masses. The posterior cortex was tapped, and then we placed 3.5 x 14 mm polyaxial screws. We then cut rods to an appropriate length, they were placed within the screw heads, and  then locking caps were placed. Once all 6 locking caps were placed, final tightening was performed against a counter torque. We then decorticated the lamina and facet joints bilaterally at each level. We packed a combination of infuse and Vitoss BA into the facet joints and over the lamina. We then proceeded with closure. Deep fascia was closed with interrupted undyed 0 Vicryl sutures, subcutaneous subcuticular closed with interrupted inverted 2-0 Vicryl sutures. Her wound was closed with Dermabond. It was dressed with the sterile gauze and Adaptic.   PLAN OF CARE: Admit for overnight observation  PATIENT DISPOSITION:  PACU - hemodynamically stable.   Delay start of Pharmacological VTE agent (>24hrs) due to surgical blood loss or risk of bleeding:  yes

## 2012-02-17 NOTE — H&P (Signed)
Subjective:  Patient is a 57 y.o. female who is admitted for treatment of nonunion and processes at the C5-6 and C6-7 levels. She status post a previous C3-4 and C4-5 ACDF that healed well, and most recently is status post a C5-6 and C6-7 ACDF in February 2012 that has not healed. She is admitted now for a C5-C7 posterior cervical arthrodesis.   Patient Active Problem List   Diagnosis Date Noted  . OTH&UNSPEC SUP INJURY SHLDR&UPPER ARM INF 08/23/2008  . CONSTIPATION, HX OF 08/23/2008  . ARTHROSCOPY, HX OF 08/23/2008  . CHOLECYSTECTOMY, HX OF 08/23/2008  . OTHER POSTSURGICAL STATUS OTHER 08/23/2008  . ACQUIRED ABSENCE OF BOTH CERVIX AND UTERUS 08/23/2008  . UNSPEC LOCAL INFECTION SKIN&SUBCUTANEOUS TISSUE 08/06/2008   Past Medical History  Diagnosis Date  . PONV (postoperative nausea and vomiting)   . Heart murmur     in the past, they cant hear it no  . Arthritis   . Nocturia   . Shortness of breath     with exertion  . Depression   . Constipation     Past Surgical History  Procedure Date  . Cholecystectomy   . Abdominal hysterectomy   . Tubal ligation   . Back surgery   . Cervical disc surgery     x 2  . Shoulder debridement     5- Right, 3 left  . Knee arthroscopy     right    Prescriptions prior to admission  Medication Sig Dispense Refill  . amoxicillin-clavulanate (AUGMENTIN) 875-125 MG per tablet Take 1 tablet by mouth 2 (two) times daily. Take for 10 days.  First dose 01/24/2012.      Marland Kitchen Cholecalciferol (VITAMIN D3) 2000 UNITS capsule Take 2,000 Units by mouth daily.      Marland Kitchen estradiol (ESTRACE) 0.5 MG tablet Take 0.5 mg by mouth daily.      Marland Kitchen FLUoxetine (PROZAC) 40 MG capsule Take 40 mg by mouth daily.      Marland Kitchen OVER THE COUNTER MEDICATION Take 1 tablet by mouth daily. Calcium 1,200mg  plus Vitamin-D 1,000mg  Tablet.      . traMADol (ULTRAM) 50 MG tablet Take 50 mg by mouth at bedtime as needed.       Allergies  Allergen Reactions  . Celecoxib Itching  . Naproxen  Sodium Itching    History  Substance Use Topics  . Smoking status: Never Smoker   . Smokeless tobacco: Not on file  . Alcohol Use: Yes     Comment: rarely - , 1 per month    No family history on file.   Review of Systems A comprehensive review of systems was negative.  Objective: Vital signs in last 24 hours: Temp:  [97.5 F (36.4 C)] 97.5 F (36.4 C) (01/13 0628) Pulse Rate:  [74] 74  (01/13 0628) Resp:  [20] 20  (01/13 0628) BP: (159)/(93) 159/93 mmHg (01/13 0628) SpO2:  [99 %] 99 % (01/13 0628)  EXAM: Patient well-developed well-nourished white female in no acute distress. Lungs are clear to auscultation , the patient has symmetrical respiratory excursion. Heart has a regular rate and rhythm normal S1 and S2 no murmur.   Abdomen is soft nontender nondistended bowel sounds are present. Extremity examination shows no clubbing cyanosis or edema. Motor examination shows 5 over 5 strength in the upper extremities including the deltoid biceps triceps and intrinsics and grip. Sensation is intact to pinprick throughout the digits of the upper extremities. Reflexes are symmetrical and without evidence of pathologic reflexes. Patient  has a normal gait and stance.   Data Review:CBC    Component Value Date/Time   WBC 7.6 02/11/2012 1328   RBC 4.61 02/11/2012 1328   HGB 14.2 02/11/2012 1328   HCT 40.2 02/11/2012 1328   PLT 254 02/11/2012 1328   MCV 87.2 02/11/2012 1328   MCH 30.8 02/11/2012 1328   MCHC 35.3 02/11/2012 1328   RDW 12.3 02/11/2012 1328   LYMPHSABS 2.2 09/19/2008 2039   MONOABS 0.4 09/19/2008 2039   EOSABS 0.2 09/19/2008 2039   BASOSABS 0.1 09/19/2008 2039                          BMET    Component Value Date/Time   NA 138 03/05/2010 1248   K 5.3* 03/05/2010 1248   CL 105 03/05/2010 1248   CO2 27 03/05/2010 1248   GLUCOSE 66* 03/05/2010 1248   BUN 20 03/05/2010 1248   CREATININE 0.85 03/05/2010 1248   CALCIUM 9.3 03/05/2010 1248   GFRNONAA >60 03/05/2010 1248   GFRAA  Value: >60         The eGFR has been calculated using the MDRD equation. This calculation has not been validated in all clinical situations. eGFR's persistently <60 mL/min signify possible Chronic Kidney Disease. 03/05/2010 1248     Assessment/Plan: Patient with C5-6 and C6-7 nonunion and pseudoarthrosis, admitted for a C5-7 posterior cervical arthrodesis.  I've discussed with the patient the nature of his condition, the nature the surgical procedure, the typical length of surgery, hospital stay, and overall recuperation. We discussed limitations postoperatively. I discussed risks of surgery including risks of infection, bleeding, possibly need for transfusion, the risk of nerve root dysfunction with pain, weakness, numbness, or paresthesias, the risk of spinal cord dysfunction with paralysis of all 4 limbs and quadriplegia, and the risk of dural tear and CSF leakage and possible need for further surgery, the risk of failure of the arthrodesis and the possible need for further surgery, and the risk of anesthetic complications including myocardial infarction, stroke, pneumonia, and death. We also discussed the need for postoperative immobilization in a cervical collar. Understanding all this the patient does wish to proceed with surgery and is admitted for such.    Hewitt Shorts, MD 02/17/2012 7:40 AM

## 2012-02-17 NOTE — Anesthesia Preprocedure Evaluation (Addendum)
Anesthesia Evaluation  Patient identified by MRN, date of birth, ID band Patient awake    Reviewed: Allergy & Precautions, H&P , NPO status , Patient's Chart, lab work & pertinent test results  History of Anesthesia Complications (+) PONV  Airway Mallampati: II TM Distance: >3 FB Neck ROM: full    Dental  (+) Dental Advisory Given   Pulmonary          Cardiovascular     Neuro/Psych Anxiety Depression    GI/Hepatic   Endo/Other    Renal/GU      Musculoskeletal  (+) Arthritis -, Osteoarthritis,    Abdominal   Peds  Hematology   Anesthesia Other Findings   Reproductive/Obstetrics                          Anesthesia Physical Anesthesia Plan  ASA: II  Anesthesia Plan: General   Post-op Pain Management:    Induction: Intravenous  Airway Management Planned: Oral ETT  Additional Equipment:   Intra-op Plan:   Post-operative Plan: Extubation in OR  Informed Consent: I have reviewed the patients History and Physical, chart, labs and discussed the procedure including the risks, benefits and alternatives for the proposed anesthesia with the patient or authorized representative who has indicated his/her understanding and acceptance.     Plan Discussed with: CRNA, Surgeon and Anesthesiologist  Anesthesia Plan Comments:        Anesthesia Quick Evaluation

## 2012-02-17 NOTE — Anesthesia Procedure Notes (Signed)
Procedure Name: Intubation Date/Time: 02/17/2012 7:55 AM Performed by: Rogelia Boga Pre-anesthesia Checklist: Patient identified, Emergency Drugs available, Suction available, Patient being monitored and Timeout performed Patient Re-evaluated:Patient Re-evaluated prior to inductionOxygen Delivery Method: Circle system utilized Preoxygenation: Pre-oxygenation with 100% oxygen Intubation Type: IV induction Ventilation: Mask ventilation without difficulty and Oral airway inserted - appropriate to patient size Laryngoscope Size: Mac and 4 Grade View: Grade I Tube type: Oral Tube size: 7.5 mm Number of attempts: 1 Airway Equipment and Method: Stylet Placement Confirmation: ETT inserted through vocal cords under direct vision,  positive ETCO2 and breath sounds checked- equal and bilateral Secured at: 20 cm Tube secured with: Tape Dental Injury: Teeth and Oropharynx as per pre-operative assessment

## 2012-02-18 ENCOUNTER — Encounter (HOSPITAL_COMMUNITY): Payer: Self-pay | Admitting: Neurosurgery

## 2012-02-18 MED ORDER — OXYCODONE-ACETAMINOPHEN 5-325 MG PO TABS: 1.0000 | ORAL_TABLET | ORAL | Status: DC | PRN

## 2012-02-18 MED ORDER — CYCLOBENZAPRINE HCL 10 MG PO TABS: 10.0000 mg | ORAL_TABLET | Freq: Three times a day (TID) | ORAL | Status: DC | PRN

## 2012-02-18 NOTE — Progress Notes (Signed)
Utilization review completed.  , RN, BSN. 

## 2012-02-18 NOTE — Discharge Summary (Signed)
Physician Discharge Summary  Patient ID: Kathy [name] MRN: [id number] DOB/AGE: March 15, 1955 57 y.o.  Admit date: 02/17/2012 Discharge date: 02/18/2012  Admission Diagnoses:  Pseudoarthrosis of cervical spine, cervicalgia  Discharge Diagnoses:  Pseudoarthrosis of cervical spine, cervicalgia  Discharged Condition: good  Hospital Course: Patient was admitted underwent a C5-7 posterior cervical arthrodesis. Postoperatively she has had moderate pain. She's been up and living. She voided. She is being discharged home with instructions regarding wound care and activities. She is to followup with me in the office in 3 weeks.  Discharge Exam: Blood pressure 96/49, pulse 91, temperature 98.5 F (36.9 C), temperature source Axillary, resp. rate 18, SpO2 99.00%.  Disposition: Home     Medication List     As of 02/18/2012  8:07 AM    TAKE these medications         amoxicillin-clavulanate 875-125 MG per tablet   Commonly known as: AUGMENTIN   Take 1 tablet by mouth 2 (two) times daily. Take for 10 days.  First dose 01/24/2012.      cyclobenzaprine 10 MG tablet   Commonly known as: FLEXERIL   Take 1 tablet (10 mg total) by mouth 3 (three) times daily as needed for muscle spasms.      estradiol 0.5 MG tablet   Commonly known as: ESTRACE   Take 0.5 mg by mouth daily.      FLUoxetine 40 MG capsule   Commonly known as: PROZAC   Take 40 mg by mouth daily.      OVER THE COUNTER MEDICATION   Take 1 tablet by mouth daily. Calcium 1,200mg  plus Vitamin-D 1,000mg  Tablet.      oxyCODONE-acetaminophen 5-325 MG per tablet   Commonly known as: PERCOCET/ROXICET   Take 1-2 tablets by mouth every 4 (four) hours as needed.      traMADol 50 MG tablet   Commonly known as: ULTRAM   Take 50 mg by mouth at bedtime as needed.      Vitamin D3 2000 UNITS capsule   Take 2,000 Units by mouth daily.         SignedHewitt Shorts, MD 02/18/2012, 8:07 AM

## 2012-02-18 NOTE — Progress Notes (Signed)
Pt doing well. Pt given D/C instructions with Rx's. Pt verbalized understanding of D/C teaching. Pt D/C'd home via wheelchair per MD order. Rema Fendt, RN

## 2012-03-12 ENCOUNTER — Other Ambulatory Visit: Payer: Self-pay | Admitting: Neurosurgery

## 2012-03-12 ENCOUNTER — Encounter (HOSPITAL_COMMUNITY): Payer: Self-pay | Admitting: Pharmacist

## 2012-03-20 ENCOUNTER — Other Ambulatory Visit (HOSPITAL_COMMUNITY): Payer: Medicare Other

## 2012-03-24 ENCOUNTER — Encounter (HOSPITAL_COMMUNITY): Payer: Self-pay

## 2012-03-24 ENCOUNTER — Encounter (HOSPITAL_COMMUNITY)
Admission: RE | Admit: 2012-03-24 | Discharge: 2012-03-24 | Disposition: A | Discharge status: Home / Self Care | Payer: Medicare Other | Source: Ambulatory Visit | Attending: Neurosurgery | Admitting: Neurosurgery

## 2012-03-24 LAB — CBC
HCT: 39.7 % (ref 36.0–46.0)
Hemoglobin: 13.8 g/dL (ref 12.0–15.0)
MCH: 29.7 pg (ref 26.0–34.0)
MCHC: 34.8 g/dL (ref 30.0–36.0)
MCV: 85.6 fL (ref 78.0–100.0)
RDW: 12.1 % (ref 11.5–15.5)

## 2012-03-24 LAB — SURGICAL PCR SCREEN
MRSA, PCR: NEGATIVE
Staphylococcus aureus: NEGATIVE

## 2012-03-24 LAB — BASIC METABOLIC PANEL
BUN: 12 mg/dL (ref 6–23)
CO2: 28 mEq/L (ref 19–32)
Chloride: 103 mEq/L (ref 96–112)
Creatinine, Ser: 0.76 mg/dL (ref 0.50–1.10)
Glucose, Bld: 90 mg/dL (ref 70–99)
Potassium: 3.4 mEq/L — ABNORMAL LOW (ref 3.5–5.1)

## 2012-03-24 MED ORDER — CEFAZOLIN SODIUM-DEXTROSE 2-3 GM-% IV SOLR
2.0000 g | INTRAVENOUS | Status: AC
  Administered 2012-03-25: 2 g via INTRAVENOUS
  Filled 2012-03-24: qty 50

## 2012-03-24 NOTE — Pre-Procedure Instructions (Signed)
Kathy [name]  03/24/2012   Your procedure is scheduled on:  Wednesday March 25, 2012  Report to [facility] at 11:50 AM.  Call this number if you have problems the morning of surgery: [phone]   Remember:   Do not eat food or drink liquids after midnight.   Take these medicines the morning of surgery with A SIP OF WATER: estradiol, prozac, oxycodone, flexeril   Do not wear jewelry, make-up or nail polish.  Do not wear lotions, powders, or perfumes  Do not shave 48 hours prior to surgery.  Do not bring valuables to the hospital.  Contacts, dentures or bridgework may not be worn into surgery.  Leave suitcase in the car. After surgery it may be brought to your room.  For patients admitted to the hospital, checkout time is 11:00 AM the day of  discharge.   Patients discharged the day of surgery will not be allowed to drive  home.  Name and phone number of your driver: family / friend  Special Instructions: Shower using CHG 2 nights before surgery and the night before surgery.  If you shower the day of surgery use CHG.  Use special wash - you have one bottle of CHG for all showers.  You should use approximately 1/3 of the bottle for each shower.   Please read over the following fact sheets that you were given: Pain Booklet, Coughing and Deep Breathing, MRSA Information and Surgical Site Infection Prevention

## 2012-03-25 ENCOUNTER — Observation Stay (HOSPITAL_COMMUNITY)
Admission: RE | Admit: 2012-03-25 | Discharge: 2012-03-26 | Disposition: A | Discharge status: Home / Self Care | Payer: Medicare Other | Source: Ambulatory Visit | Attending: Neurosurgery | Admitting: Neurosurgery

## 2012-03-25 ENCOUNTER — Ambulatory Visit (HOSPITAL_COMMUNITY): Payer: Medicare Other | Admitting: Certified Registered"

## 2012-03-25 ENCOUNTER — Encounter (HOSPITAL_COMMUNITY): Payer: Self-pay | Admitting: Certified Registered"

## 2012-03-25 ENCOUNTER — Encounter (HOSPITAL_COMMUNITY)
Admission: RE | Disposition: A | Discharge status: Home / Self Care | Payer: Self-pay | Source: Ambulatory Visit | Attending: Neurosurgery

## 2012-03-25 ENCOUNTER — Encounter (HOSPITAL_COMMUNITY): Payer: Self-pay

## 2012-03-25 ENCOUNTER — Ambulatory Visit (HOSPITAL_COMMUNITY): Payer: Medicare Other

## 2012-03-25 DIAGNOSIS — M542 Cervicalgia: Secondary | ICD-10-CM | POA: Insufficient documentation

## 2012-03-25 DIAGNOSIS — Z01812 Encounter for preprocedural laboratory examination: Secondary | ICD-10-CM | POA: Insufficient documentation

## 2012-03-25 DIAGNOSIS — T84498A Other mechanical complication of other internal orthopedic devices, implants and grafts, initial encounter: Principal | ICD-10-CM | POA: Insufficient documentation

## 2012-03-25 DIAGNOSIS — Y832 Surgical operation with anastomosis, bypass or graft as the cause of abnormal reaction of the patient, or of later complication, without mention of misadventure at the time of the procedure: Secondary | ICD-10-CM | POA: Insufficient documentation

## 2012-03-25 HISTORY — PX: POSTERIOR CERVICAL FUSION/FORAMINOTOMY: SHX5038

## 2012-03-25 SURGERY — POSTERIOR CERVICAL FUSION/FORAMINOTOMY LEVEL 1
Anesthesia: General | Site: Neck | Wound class: Clean

## 2012-03-25 MED ORDER — KETOROLAC TROMETHAMINE 30 MG/ML IJ SOLN
30.0000 mg | Freq: Once | INTRAMUSCULAR | Status: AC
  Administered 2012-03-25: 30 mg via INTRAVENOUS

## 2012-03-25 MED ORDER — HYDROXYZINE HCL 50 MG/ML IM SOLN: 50.0000 mg | INTRAMUSCULAR | Status: DC | PRN

## 2012-03-25 MED ORDER — SURGIFOAM 100 EX MISC
CUTANEOUS | Status: DC | PRN
  Administered 2012-03-25: 14:00:00 via TOPICAL

## 2012-03-25 MED ORDER — BUPIVACAINE HCL (PF) 0.5 % IJ SOLN
INTRAMUSCULAR | Status: DC | PRN
  Administered 2012-03-25: 4.5 mL

## 2012-03-25 MED ORDER — PROPOFOL 10 MG/ML IV BOLUS
INTRAVENOUS | Status: DC | PRN
  Administered 2012-03-25: 150 mg via INTRAVENOUS

## 2012-03-25 MED ORDER — OXYCODONE HCL 5 MG PO TABS
5.0000 mg | ORAL_TABLET | ORAL | Status: DC | PRN
  Administered 2012-03-26: 5 mg via ORAL
  Administered 2012-03-26 (×2): 10 mg via ORAL
  Administered 2012-03-26: 5 mg via ORAL
  Filled 2012-03-25 (×3): qty 2

## 2012-03-25 MED ORDER — FLUOXETINE HCL 20 MG PO TABS
40.0000 mg | ORAL_TABLET | Freq: Every day | ORAL | Status: DC
  Administered 2012-03-25 – 2012-03-26 (×2): 40 mg via ORAL
  Filled 2012-03-25 (×2): qty 2

## 2012-03-25 MED ORDER — ACETAMINOPHEN 10 MG/ML IV SOLN
INTRAVENOUS | Status: AC
  Administered 2012-03-25: 1000 mg via INTRAVENOUS
  Filled 2012-03-25: qty 100

## 2012-03-25 MED ORDER — MIDAZOLAM HCL 5 MG/5ML IJ SOLN
INTRAMUSCULAR | Status: DC | PRN
  Administered 2012-03-25: 2 mg via INTRAVENOUS

## 2012-03-25 MED ORDER — KETOROLAC TROMETHAMINE 30 MG/ML IJ SOLN
INTRAMUSCULAR | Status: AC
  Filled 2012-03-25: qty 1

## 2012-03-25 MED ORDER — LIDOCAINE HCL (CARDIAC) 20 MG/ML IV SOLN
INTRAVENOUS | Status: DC | PRN
  Administered 2012-03-25: 40 mg via INTRAVENOUS

## 2012-03-25 MED ORDER — MORPHINE SULFATE 4 MG/ML IJ SOLN
4.0000 mg | INTRAMUSCULAR | Status: DC | PRN
  Administered 2012-03-25: 4 mg via INTRAMUSCULAR
  Filled 2012-03-25: qty 1

## 2012-03-25 MED ORDER — KETOROLAC TROMETHAMINE 30 MG/ML IJ SOLN
30.0000 mg | Freq: Four times a day (QID) | INTRAMUSCULAR | Status: DC
  Administered 2012-03-25 – 2012-03-26 (×3): 30 mg via INTRAVENOUS
  Filled 2012-03-25 (×6): qty 1

## 2012-03-25 MED ORDER — SODIUM CHLORIDE 0.9 % IV SOLN
INTRAVENOUS | Status: AC
  Filled 2012-03-25: qty 500

## 2012-03-25 MED ORDER — MAGNESIUM HYDROXIDE 400 MG/5ML PO SUSP: 30.0000 mL | Freq: Every day | ORAL | Status: DC | PRN

## 2012-03-25 MED ORDER — HYDROMORPHONE HCL PF 1 MG/ML IJ SOLN
0.2500 mg | INTRAMUSCULAR | Status: DC | PRN
  Administered 2012-03-25 (×4): 0.5 mg via INTRAVENOUS

## 2012-03-25 MED ORDER — KCL IN DEXTROSE-NACL 40-5-0.45 MEQ/L-%-% IV SOLN
INTRAVENOUS | Status: DC
  Administered 2012-03-25: 22:00:00 via INTRAVENOUS
  Filled 2012-03-25 (×4): qty 1000

## 2012-03-25 MED ORDER — ACETAMINOPHEN 325 MG PO TABS: 650.0000 mg | ORAL_TABLET | ORAL | Status: DC | PRN

## 2012-03-25 MED ORDER — OXYCODONE HCL 5 MG PO TABS
5.0000 mg | ORAL_TABLET | Freq: Once | ORAL | Status: AC | PRN
Start: 2012-03-25 — End: 2012-03-25
  Administered 2012-03-25: 5 mg via ORAL

## 2012-03-25 MED ORDER — VITAMIN D 1000 UNITS PO TABS
2000.0000 [IU] | ORAL_TABLET | Freq: Every day | ORAL | Status: DC
Start: 2012-03-26 — End: 2012-03-26
  Administered 2012-03-26: 2000 [IU] via ORAL
  Filled 2012-03-25: qty 2

## 2012-03-25 MED ORDER — PHENYLEPHRINE HCL 10 MG/ML IJ SOLN
INTRAMUSCULAR | Status: DC | PRN
  Administered 2012-03-25 (×6): 80 ug via INTRAVENOUS

## 2012-03-25 MED ORDER — ZOLPIDEM TARTRATE 5 MG PO TABS: 5.0000 mg | ORAL_TABLET | Freq: Every evening | ORAL | Status: DC | PRN

## 2012-03-25 MED ORDER — MENTHOL 3 MG MT LOZG: 1.0000 | LOZENGE | OROMUCOSAL | Status: DC | PRN

## 2012-03-25 MED ORDER — HYDROMORPHONE HCL PF 1 MG/ML IJ SOLN
INTRAMUSCULAR | Status: AC
  Filled 2012-03-25: qty 1

## 2012-03-25 MED ORDER — VECURONIUM BROMIDE 10 MG IV SOLR
INTRAVENOUS | Status: DC | PRN
Start: 2012-03-25 — End: 2012-03-25
  Administered 2012-03-25: 2 mg via INTRAVENOUS

## 2012-03-25 MED ORDER — BACITRACIN 50000 UNITS IM SOLR
INTRAMUSCULAR | Status: AC
  Filled 2012-03-25: qty 1

## 2012-03-25 MED ORDER — ACETAMINOPHEN 650 MG RE SUPP: 650.0000 mg | RECTAL | Status: DC | PRN

## 2012-03-25 MED ORDER — ALUM & MAG HYDROXIDE-SIMETH 200-200-20 MG/5ML PO SUSP: 30.0000 mL | Freq: Four times a day (QID) | ORAL | Status: DC | PRN

## 2012-03-25 MED ORDER — HYDROXYZINE HCL 25 MG PO TABS: 50.0000 mg | ORAL_TABLET | ORAL | Status: DC | PRN

## 2012-03-25 MED ORDER — ROCURONIUM BROMIDE 100 MG/10ML IV SOLN
INTRAVENOUS | Status: DC | PRN
  Administered 2012-03-25: 50 mg via INTRAVENOUS

## 2012-03-25 MED ORDER — CYCLOBENZAPRINE HCL 10 MG PO TABS
10.0000 mg | ORAL_TABLET | Freq: Three times a day (TID) | ORAL | Status: DC | PRN
  Administered 2012-03-25 – 2012-03-26 (×2): 10 mg via ORAL
  Filled 2012-03-25 (×2): qty 1

## 2012-03-25 MED ORDER — LIDOCAINE-EPINEPHRINE 1 %-1:100000 IJ SOLN
INTRAMUSCULAR | Status: DC | PRN
  Administered 2012-03-25: 4.5 mL

## 2012-03-25 MED ORDER — PHENOL 1.4 % MT LIQD: 1.0000 | OROMUCOSAL | Status: DC | PRN

## 2012-03-25 MED ORDER — SODIUM CHLORIDE 0.9 % IR SOLN
Status: DC | PRN
  Administered 2012-03-25: 14:00:00

## 2012-03-25 MED ORDER — BISACODYL 10 MG RE SUPP: 10.0000 mg | Freq: Every day | RECTAL | Status: DC | PRN

## 2012-03-25 MED ORDER — OXYCODONE HCL 5 MG PO TABS
ORAL_TABLET | ORAL | Status: AC
  Filled 2012-03-25: qty 1

## 2012-03-25 MED ORDER — ACETAMINOPHEN 10 MG/ML IV SOLN
1000.0000 mg | Freq: Four times a day (QID) | INTRAVENOUS | Status: DC
  Administered 2012-03-26 (×3): 1000 mg via INTRAVENOUS
  Filled 2012-03-25 (×5): qty 100

## 2012-03-25 MED ORDER — DIAZEPAM 5 MG/ML IJ SOLN
INTRAMUSCULAR | Status: AC
  Administered 2012-03-25: 2.5 mg
  Filled 2012-03-25: qty 2

## 2012-03-25 MED ORDER — LACTATED RINGERS IV SOLN
INTRAVENOUS | Status: DC | PRN
  Administered 2012-03-25 (×2): via INTRAVENOUS

## 2012-03-25 MED ORDER — ESTRADIOL 1 MG PO TABS
0.5000 mg | ORAL_TABLET | Freq: Every day | ORAL | Status: DC
  Administered 2012-03-26: 0.5 mg via ORAL
  Filled 2012-03-25: qty 0.5

## 2012-03-25 MED ORDER — SODIUM CHLORIDE 0.9 % IJ SOLN: 3.0000 mL | INTRAMUSCULAR | Status: DC | PRN

## 2012-03-25 MED ORDER — 0.9 % SODIUM CHLORIDE (POUR BTL) OPTIME
TOPICAL | Status: DC | PRN
  Administered 2012-03-25: 1000 mL

## 2012-03-25 MED ORDER — SODIUM CHLORIDE 0.9 % IV SOLN: 250.0000 mL | INTRAVENOUS | Status: DC

## 2012-03-25 MED ORDER — SUFENTANIL CITRATE 50 MCG/ML IV SOLN
INTRAVENOUS | Status: DC | PRN
  Administered 2012-03-25: 20 ug via INTRAVENOUS
  Administered 2012-03-25 (×2): 10 ug via INTRAVENOUS

## 2012-03-25 MED ORDER — ONDANSETRON HCL 4 MG/2ML IJ SOLN
INTRAMUSCULAR | Status: DC | PRN
  Administered 2012-03-25: 4 mg via INTRAVENOUS

## 2012-03-25 MED ORDER — DEXAMETHASONE SODIUM PHOSPHATE 4 MG/ML IJ SOLN
INTRAMUSCULAR | Status: DC | PRN
  Administered 2012-03-25: 10 mg via INTRAVENOUS

## 2012-03-25 MED ORDER — SODIUM CHLORIDE 0.9 % IJ SOLN
3.0000 mL | Freq: Two times a day (BID) | INTRAMUSCULAR | Status: DC
  Administered 2012-03-26: 3 mL via INTRAVENOUS

## 2012-03-25 MED ORDER — OXYCODONE HCL 5 MG/5ML PO SOLN: 5.0000 mg | Freq: Once | ORAL | Status: AC | PRN

## 2012-03-25 MED ORDER — BACITRACIN ZINC 500 UNIT/GM EX OINT
TOPICAL_OINTMENT | CUTANEOUS | Status: DC | PRN
  Administered 2012-03-25: 1 via TOPICAL

## 2012-03-25 SURGICAL SUPPLY — 69 items
BAG DECANTER FOR FLEXI CONT (MISCELLANEOUS) ×2 IMPLANT
BIT DRILL NEURO 2X3.1 SFT TUCH (MISCELLANEOUS) ×1 IMPLANT
BIT DRILL WIRE PASS 1.3MM (BIT) IMPLANT
BLADE SURG 11 STRL SS (BLADE) ×2 IMPLANT
BLADE SURG ROTATE 9660 (MISCELLANEOUS) ×2 IMPLANT
BRUSH SCRUB EZ PLAIN DRY (MISCELLANEOUS) ×2 IMPLANT
CANISTER SUCTION 2500CC (MISCELLANEOUS) ×2 IMPLANT
CLOTH BEACON ORANGE TIMEOUT ST (SAFETY) ×2 IMPLANT
CONT SPEC 4OZ CLIKSEAL STRL BL (MISCELLANEOUS) ×6 IMPLANT
COVER TABLE BACK 60X90 (DRAPES) IMPLANT
DECANTER SPIKE VIAL GLASS SM (MISCELLANEOUS) ×2 IMPLANT
DERMABOND ADHESIVE PROPEN (GAUZE/BANDAGES/DRESSINGS) ×2
DERMABOND ADVANCED (GAUZE/BANDAGES/DRESSINGS)
DERMABOND ADVANCED .7 DNX12 (GAUZE/BANDAGES/DRESSINGS) IMPLANT
DERMABOND ADVANCED .7 DNX6 (GAUZE/BANDAGES/DRESSINGS) ×2 IMPLANT
DRAPE C-ARM 42X72 X-RAY (DRAPES) ×4 IMPLANT
DRAPE LAPAROTOMY 100X72 PEDS (DRAPES) ×2 IMPLANT
DRAPE POUCH INSTRU U-SHP 10X18 (DRAPES) ×2 IMPLANT
DRAPE PROXIMA HALF (DRAPES) IMPLANT
DRILL NEURO 2X3.1 SOFT TOUCH (MISCELLANEOUS) ×2
DRILL WIRE PASS 1.3MM (BIT)
DRSG EMULSION OIL 3X3 NADH (GAUZE/BANDAGES/DRESSINGS) IMPLANT
ELECT REM PT RETURN 9FT ADLT (ELECTROSURGICAL) ×2
ELECTRODE REM PT RTRN 9FT ADLT (ELECTROSURGICAL) ×1 IMPLANT
EVACUATOR 1/8 PVC DRAIN (DRAIN) IMPLANT
GAUZE SPONGE 4X4 16PLY XRAY LF (GAUZE/BANDAGES/DRESSINGS) IMPLANT
GLOVE BIO SURGEON STRL SZ8 (GLOVE) ×2 IMPLANT
GLOVE BIOGEL PI IND STRL 8 (GLOVE) ×3 IMPLANT
GLOVE BIOGEL PI IND STRL 8.5 (GLOVE) ×2 IMPLANT
GLOVE BIOGEL PI INDICATOR 8 (GLOVE) ×3
GLOVE BIOGEL PI INDICATOR 8.5 (GLOVE) ×2
GLOVE ECLIPSE 7.5 STRL STRAW (GLOVE) ×10 IMPLANT
GLOVE EXAM NITRILE LRG STRL (GLOVE) IMPLANT
GLOVE EXAM NITRILE MD LF STRL (GLOVE) IMPLANT
GLOVE EXAM NITRILE XL STR (GLOVE) IMPLANT
GLOVE EXAM NITRILE XS STR PU (GLOVE) IMPLANT
GOWN BRE IMP SLV AUR LG STRL (GOWN DISPOSABLE) ×2 IMPLANT
GOWN BRE IMP SLV AUR XL STRL (GOWN DISPOSABLE) ×2 IMPLANT
GOWN STRL REIN 2XL LVL4 (GOWN DISPOSABLE) ×2 IMPLANT
HEMOSTAT SURGICEL 2X14 (HEMOSTASIS) IMPLANT
KIT BASIN OR (CUSTOM PROCEDURE TRAY) ×2 IMPLANT
KIT INFUSE XX SMALL 0.7CC (Orthopedic Implant) ×2 IMPLANT
KIT ROOM TURNOVER OR (KITS) ×2 IMPLANT
NEEDLE SPNL 18GX3.5 QUINCKE PK (NEEDLE) ×2 IMPLANT
NEEDLE SPNL 22GX3.5 QUINCKE BK (NEEDLE) ×4 IMPLANT
NS IRRIG 1000ML POUR BTL (IV SOLUTION) ×2 IMPLANT
PACK LAMINECTOMY NEURO (CUSTOM PROCEDURE TRAY) ×2 IMPLANT
PAD ARMBOARD 7.5X6 YLW CONV (MISCELLANEOUS) ×4 IMPLANT
PATTIES SURGICAL .5 X3 (DISPOSABLE) ×2 IMPLANT
PIN MAYFIELD SKULL DISP (PIN) ×2 IMPLANT
ROD VUEPOINT 3.5X60 (Rod) ×2 IMPLANT
ROD VUEPOINT 80MM (Rod) ×2 IMPLANT
SCREW MA MM 3.5X12 (Screw) ×4 IMPLANT
SCREW SET THREADED (Screw) ×16 IMPLANT
SPONGE GAUZE 4X4 12PLY (GAUZE/BANDAGES/DRESSINGS) IMPLANT
SPONGE LAP 4X18 X RAY DECT (DISPOSABLE) IMPLANT
SPONGE SURGIFOAM ABS GEL 100 (HEMOSTASIS) ×2 IMPLANT
STAPLER SKIN PROX WIDE 3.9 (STAPLE) ×2 IMPLANT
STRIP BIOACTIVE VITOSS 25X52X4 (Orthopedic Implant) ×2 IMPLANT
SUT ETHILON 3 0 FSL (SUTURE) IMPLANT
SUT VIC AB 0 CT1 18XCR BRD8 (SUTURE) ×2 IMPLANT
SUT VIC AB 0 CT1 8-18 (SUTURE) ×2
SUT VIC AB 2-0 CP2 18 (SUTURE) ×4 IMPLANT
SYR 20ML ECCENTRIC (SYRINGE) ×2 IMPLANT
TOWEL OR 17X24 6PK STRL BLUE (TOWEL DISPOSABLE) ×2 IMPLANT
TOWEL OR 17X26 10 PK STRL BLUE (TOWEL DISPOSABLE) ×2 IMPLANT
TRAY FOLEY CATH 14FRSI W/METER (CATHETERS) IMPLANT
UNDERPAD 30X30 INCONTINENT (UNDERPADS AND DIAPERS) IMPLANT
WATER STERILE IRR 1000ML POUR (IV SOLUTION) ×2 IMPLANT

## 2012-03-25 NOTE — Preoperative (Signed)
Beta Blockers   Reason not to administer Beta Blockers:Not Applicable 

## 2012-03-25 NOTE — Op Note (Signed)
03/25/2012  3:27 PM  PATIENT:  Kathy [name]  57 y.o. female  PRE-OPERATIVE DIAGNOSIS:  Nonunion/pseudoarthorsis C5-6, cervicalgia   POST-OPERATIVE DIAGNOSIS:  Nonunion/pseudoarthorsis C5-6, cervicalgia  PROCEDURE:  Procedure(s): POSTERIOR CERVICAL FUSION LEVEL 1:  C5-6 posterior cervical arthrodesis with vuepoint lateral mass screws, rods, Vitoss BA, and infuse  SURGEON:  Surgeon(s): Hewitt Shorts, MD Maeola Harman, MD  ASSISTANTS: Maeola Harman, M.D.  ANESTHESIA:   general  EBL:  Total I/O In: 1400 [I.V.:1400] Out: -   BLOOD ADMINISTERED:none  COUNT: Correct per nursing staff  DICTATION: Patient was brought to the operating room, placed under general endotracheal anesthesia. The patient was placed in a 3 pin Mayfield head holder, and turned to a prone position. The lowest portion of the occipital scalp was shaved, and then the occipital scalp, posterior neck, and upper back were prepped with Betadine soap and solution draped in a sterile fashion. The midline was infiltrated local anesthetic with epinephrine. Midline incision was made carried down through the subcutaneous tissue. Bipolar cautery and electrocautery were used to maintain hemostasis. The posterior cervical fascia was incised bilaterally, and the paracervical musculature was dissected from the spinous process and lamina in a subperiosteal fashion. Patient's previous fusion construct was exposed. We then exposed the spinous process, lamina, and facet complex of C5 bilaterally. The locking caps on the existing posterior instrumentation were unlocked against a counter torque , and the rods removed. We then, with C-arm fluoroscopic guidance, placed pilot holes in the lateral masses of C5 bilaterally, and then drilled screw holes in a superior lateral anterior trajectory. We examined these screw holes with a ball probe and found good bony surfaces. We then tapped the posterior cortex, and placed 3.5 x 12 mm screws  bilaterally. We then cut rods to a proper length for each side,  We placed the rods within the screw heads, and then placed locking caps. The locking caps were tightened against a counter torque.  We decorticated the lamina and facet joints at C5-6, and packed a combination of infuse and Vitoss BA. The wound was irrigated numerous times through the procedure with saline and then bacitracin solution. We then proceeded with closure once hemostasis confirmed. The deep fascia was closed with interrupted undyed 0 Vicryl sutures, Scarpa's fascia was closed with interrupted undyed 0 Vicryl sutures, the subcutaneous and subcuticular layer was closed with interrupted inverted 2-0 Vicryl sutures. The skin is approximated with Dermabond. The wound was dressed with sterile gauze and Hypafix. Following surgery the patient was turned back in supine position,  the 3 pin Mayfield head holder was removed, reversed from the anesthetic to be extubated, and transferred to the recovery room for further care.   PLAN OF CARE: Admit for overnight observation  PATIENT DISPOSITION:  PACU - hemodynamically stable.   Delay start of Pharmacological VTE agent (>24hrs) due to surgical blood loss or risk of bleeding:  yes

## 2012-03-25 NOTE — Anesthesia Preprocedure Evaluation (Signed)
Anesthesia Evaluation  Patient identified by MRN, date of birth, ID band Patient awake    Reviewed: Allergy & Precautions, H&P , NPO status , Patient's Chart, lab work & pertinent test results  History of Anesthesia Complications (+) PONV  Airway Mallampati: II TM Distance: >3 FB Neck ROM: Full    Dental no notable dental hx. (+) Teeth Intact, Chipped and Dental Advisory Given   Pulmonary neg pulmonary ROS,  breath sounds clear to auscultation  Pulmonary exam normal       Cardiovascular negative cardio ROS  Rhythm:Regular Rate:Normal     Neuro/Psych PSYCHIATRIC DISORDERS negative neurological ROS     GI/Hepatic negative GI ROS, Neg liver ROS,   Endo/Other  negative endocrine ROS  Renal/GU negative Renal ROS  negative genitourinary   Musculoskeletal   Abdominal   Peds  Hematology negative hematology ROS (+)   Anesthesia Other Findings   Reproductive/Obstetrics negative OB ROS                           Anesthesia Physical Anesthesia Plan  ASA: II  Anesthesia Plan: General   Post-op Pain Management:    Induction: Intravenous  Airway Management Planned: Oral ETT  Additional Equipment:   Intra-op Plan:   Post-operative Plan: Extubation in OR  Informed Consent: I have reviewed the patients History and Physical, chart, labs and discussed the procedure including the risks, benefits and alternatives for the proposed anesthesia with the patient or authorized representative who has indicated his/her understanding and acceptance.   Dental advisory given  Plan Discussed with: CRNA  Anesthesia Plan Comments:         Anesthesia Quick Evaluation

## 2012-03-25 NOTE — Progress Notes (Signed)
Filed Vitals:   03/25/12 1900 03/25/12 1915 03/25/12 1930 03/25/12 1950  BP:    145/82  Pulse: 97 96 100 99  Temp:    97.5 F (36.4 C)  TempSrc:      Resp: 17 15 19 18   SpO2: 97% 97% 96% 95%    CBC  Recent Labs  03/24/12 0823  WBC 6.7  HGB 13.8  HCT 39.7  PLT 224   BMET  Recent Labs  03/24/12 0823  NA 140  K 3.4*  CL 103  CO2 28  GLUCOSE 90  BUN 12  CREATININE 0.76  CALCIUM 9.4    Patient resting comfortably in bed, has been up and out of bed to the bathroom, and has voided. Dressing clean and dry. Comfortable. Feels swallowing is actually better than it had been prior to surgery. Taking well by mouth.  Plan: Continue progress to postoperative recovery.  Hewitt Shorts, MD 03/25/2012, 8:55 PM

## 2012-03-25 NOTE — H&P (Signed)
Subjective: Patient is a 57 y.o. female who is admitted for treatment of C5-6 nonunion and pseudoarthrosis. Patient is status post a previous ACDF that has not healed. She is admitted now for a posterior cervical arthrodesis with lateral mass screws, rods, and bone graft.   Patient Active Problem List   Diagnosis Date Noted  . OTH&UNSPEC SUP INJURY SHLDR&UPPER ARM INF 08/23/2008  . CONSTIPATION, HX OF 08/23/2008  . ARTHROSCOPY, HX OF 08/23/2008  . CHOLECYSTECTOMY, HX OF 08/23/2008  . OTHER POSTSURGICAL STATUS OTHER 08/23/2008  . ACQUIRED ABSENCE OF BOTH CERVIX AND UTERUS 08/23/2008  . UNSPEC LOCAL INFECTION SKIN&SUBCUTANEOUS TISSUE 08/06/2008   Past Medical History  Diagnosis Date  . Heart murmur     in the past, they cant hear it no  . Arthritis   . Nocturia   . Shortness of breath     with exertion  . Depression   . Constipation   . PONV (postoperative nausea and vomiting)     Past Surgical History  Procedure Laterality Date  . Cholecystectomy    . Abdominal hysterectomy    . Tubal ligation    . Cervical disc surgery      x 3  . Shoulder debridement      5- Right, 3 left  . Knee arthroscopy      right  . Posterior cervical fusion/foraminotomy  02/17/2012    Procedure: POSTERIOR CERVICAL FUSION/FORAMINOTOMY LEVEL 2;  Surgeon: Hewitt Shorts, MD;  Location: [facility];  Service: Neurosurgery;  Laterality: N/A;  Cervical five to seven posterior arthrodesis    Prescriptions prior to admission  Medication Sig Dispense Refill  . Cholecalciferol (VITAMIN D3) 2000 UNITS capsule Take 2,000 Units by mouth daily.      . cyclobenzaprine (FLEXERIL) 10 MG tablet Take 10 mg by mouth 3 (three) times daily as needed. For spasms      . estradiol (ESTRACE) 0.5 MG tablet Take 0.5 mg by mouth daily.      Marland Kitchen FLUoxetine (PROZAC) 40 MG capsule Take 40 mg by mouth daily.      Marland Kitchen OVER THE COUNTER MEDICATION Take 1 tablet by mouth daily. Calcium carbonate 1,200mg  plus Vitamin-D 1,000mg  Tablet.       Marland Kitchen oxyCODONE-acetaminophen (PERCOCET/ROXICET) 5-325 MG per tablet Take 1-2 tablets by mouth every 4 (four) hours as needed. For pain       Allergies  Allergen Reactions  . Celecoxib Itching  . Naproxen Sodium Itching    History  Substance Use Topics  . Smoking status: Never Smoker   . Smokeless tobacco: Not on file  . Alcohol Use: Yes     Comment: rarely - , 1 per month    History reviewed. No pertinent family history.   Review of Systems A comprehensive review of systems was negative.  Objective: Vital signs in last 24 hours: Temp:  [97.3 F (36.3 C)] 97.3 F (36.3 C) (02/19 1120) Pulse Rate:  [98] 98 (02/19 1120) Resp:  [20] 20 (02/19 1120) BP: (128)/(84) 128/84 mmHg (02/19 1120) SpO2:  [95 %] 95 % (02/19 1120)  EXAM: Patient well-developed well-nourished white female in no acute distress. Lungs are clear to auscultation , the patient has symmetrical respiratory excursion. Heart has a regular rate and rhythm normal S1 and S2 no murmur.   Abdomen is soft nontender nondistended bowel sounds are present. Extremity examination shows no clubbing cyanosis or edema. Motor examination shows 5/5 strength in the upper extremities including the deltoid biceps triceps and intrinsics and grip.  Sensation is intact to pinprick throughout the digits of the upper extremities. Reflexes are symmetrical and without evidence of pathologic reflexes. Patient has a normal gait and stance.   Data Review:CBC    Component Value Date/Time   WBC 6.7 03/24/2012 0823   RBC 4.64 03/24/2012 0823   HGB 13.8 03/24/2012 0823   HCT 39.7 03/24/2012 0823   PLT 224 03/24/2012 0823   MCV 85.6 03/24/2012 0823   MCH 29.7 03/24/2012 0823   MCHC 34.8 03/24/2012 0823   RDW 12.1 03/24/2012 0823   LYMPHSABS 2.2 09/19/2008 2039   MONOABS 0.4 09/19/2008 2039   EOSABS 0.2 09/19/2008 2039   BASOSABS 0.1 09/19/2008 2039                          BMET    Component Value Date/Time   NA 140 03/24/2012 0823   K 3.4* 03/24/2012  0823   CL 103 03/24/2012 0823   CO2 28 03/24/2012 0823   GLUCOSE 90 03/24/2012 0823   BUN 12 03/24/2012 0823   CREATININE 0.76 03/24/2012 0823   CALCIUM 9.4 03/24/2012 0823   GFRNONAA >90 03/24/2012 0823   GFRAA >90 03/24/2012 1191     Assessment/Plan: Patient the C5-6 nonunion/pseudoarthrosis, admitted for a C5-6 posterior cervical arthrodesis.  I've discussed with the patient the nature of his condition, the nature the surgical procedure, the typical length of surgery, hospital stay, and overall recuperation. We discussed limitations postoperatively. I discussed risks of surgery including risks of infection, bleeding, possibly need for transfusion, the risk of nerve root dysfunction with pain, weakness, numbness, or paresthesias, the risk of spinal cord dysfunction with paralysis of all 4 limbs and quadriplegia, and the risk of dural tear and CSF leakage and possible need for further surgery, the risk of failure of the arthrodesis and the possible need for further surgery, and the risk of anesthetic complications including myocardial infarction, stroke, pneumonia, and death. We also discussed the need for postoperative immobilization in a cervical collar. Understanding all this the patient does wish to proceed with surgery and is admitted for such.    Hewitt Shorts, MD 03/25/2012 12:43 PM

## 2012-03-25 NOTE — Transfer of Care (Signed)
Immediate Anesthesia Transfer of Care Note  Patient: Kathy [name]  Procedure(s) Performed: Procedure(s) with comments: POSTERIOR CERVICAL FUSION/FORAMINOTOMY LEVEL 1 (N/A) - Cervical five-six posterior cervical arthrodesis and instrumentation   Patient Location: PACU  Anesthesia Type:General  Level of Consciousness: awake, alert  and oriented  Airway & Oxygen Therapy: Patient Spontanous Breathing and Patient connected to nasal cannula oxygen  Post-op Assessment: Report given to PACU RN, Post -op Vital signs reviewed and stable and Patient moving all extremities  Post vital signs: Reviewed and stable  Complications: No apparent anesthesia complications

## 2012-03-25 NOTE — Anesthesia Postprocedure Evaluation (Signed)
  Anesthesia Post-op Note  Patient: Kathy [name]  Procedure(s) Performed: Procedure(s) with comments: POSTERIOR CERVICAL FUSION/FORAMINOTOMY LEVEL 1 (N/A) - Cervical five-six posterior cervical arthrodesis and instrumentation   Patient Location: PACU  Anesthesia Type:General  Level of Consciousness: awake, alert  and oriented  Airway and Oxygen Therapy: Patient Spontanous Breathing and Patient connected to nasal cannula oxygen  Post-op Pain: mild  Post-op Assessment: Post-op Vital signs reviewed, Patient's Cardiovascular Status Stable, Respiratory Function Stable, Patent Airway and No signs of Nausea or vomiting  Post-op Vital Signs: Reviewed and stable  Complications: No apparent anesthesia complications

## 2012-03-25 NOTE — Anesthesia Postprocedure Evaluation (Signed)
  Anesthesia Post-op Note  Patient: Carlisle Torgeson Wicke  Procedure(s) Performed: Procedure(s) with comments: POSTERIOR CERVICAL FUSION/FORAMINOTOMY LEVEL 1 (N/A) - Cervical five-six posterior cervical arthrodesis and instrumentation   Patient Location: PACU  Anesthesia Type:General  Level of Consciousness: awake, alert  and oriented  Airway and Oxygen Therapy: Patient Spontanous Breathing and Patient connected to nasal cannula oxygen  Post-op Pain: mild  Post-op Assessment: Post-op Vital signs reviewed  Post-op Vital Signs: Reviewed  Complications: No apparent anesthesia complications

## 2012-03-26 MED ORDER — OXYCODONE-ACETAMINOPHEN 5-325 MG PO TABS: 1.0000 | ORAL_TABLET | ORAL | Status: DC | PRN

## 2012-03-26 NOTE — Progress Notes (Signed)
Pt doing well. Pt given D/C instructions with Rx, verbal understanding given. Pt D/C'd home with friend @ 1520 per MD order. Rema Fendt, RN

## 2012-03-26 NOTE — Progress Notes (Signed)
UR COMPLETED  

## 2012-03-26 NOTE — Discharge Summary (Signed)
Physician Discharge Summary  Patient ID: JAMA MCMILLER MRN: [id number] DOB/AGE: 1955/04/27 57 y.o.  Admit date: 03/25/2012 Discharge date: 03/26/2012  Admission Diagnoses:  Nonunion/pseudoarthrosis at C5-6, cervicalgia  Discharge Diagnoses: Nonunion/pseudoarthrosis at C5-6, cervicalgia Active Problems:   * No active hospital problems. *   Discharged Condition: good  Hospital Course: Patient was admitted underwent a C5-6 posterior cervical arthrodesis. Postoperatively she has done well. The wound is healing nicely. She is up and ambulate. We are discharging her to home with instructions regarding wound care and activities. She is to return for followup with me in 3 weeks.  Discharge Exam: Blood pressure 134/65, pulse 89, temperature 98.5 F (36.9 C), temperature source Oral, resp. rate 16, SpO2 95.00%.  Disposition: Home     Medication List    TAKE these medications       cyclobenzaprine 10 MG tablet  Commonly known as:  FLEXERIL  Take 10 mg by mouth 3 (three) times daily as needed. For spasms     estradiol 0.5 MG tablet  Commonly known as:  ESTRACE  Take 0.5 mg by mouth daily.     FLUoxetine 40 MG capsule  Commonly known as:  PROZAC  Take 40 mg by mouth daily.     OVER THE COUNTER MEDICATION  Take 1 tablet by mouth daily. Calcium carbonate 1,200mg  plus Vitamin-D 1,000mg  Tablet.     oxyCODONE-acetaminophen 5-325 MG per tablet  Commonly known as:  PERCOCET/ROXICET  Take 1-2 tablets by mouth every 4 (four) hours as needed.     oxyCODONE-acetaminophen 5-325 MG per tablet  Commonly known as:  PERCOCET/ROXICET  Take 1-2 tablets by mouth every 4 (four) hours as needed. For pain     Vitamin D3 2000 UNITS capsule  Take 2,000 Units by mouth daily.         SignedHewitt Shorts, MD 03/26/2012, 2:07 PM

## 2012-03-27 ENCOUNTER — Encounter (HOSPITAL_COMMUNITY): Payer: Self-pay | Admitting: Neurosurgery

## 2012-12-25 ENCOUNTER — Other Ambulatory Visit: Payer: Self-pay

## 2012-12-25 DIAGNOSIS — Z1231 Encounter for screening mammogram for malignant neoplasm of breast: Secondary | ICD-10-CM

## 2013-01-01 ENCOUNTER — Ambulatory Visit
Admission: RE | Admit: 2013-01-01 | Discharge: 2013-01-01 | Disposition: A | Discharge status: Home / Self Care | Payer: Commercial Managed Care - HMO | Source: Ambulatory Visit

## 2013-01-01 DIAGNOSIS — Z1231 Encounter for screening mammogram for malignant neoplasm of breast: Secondary | ICD-10-CM

## 2013-04-07 DIAGNOSIS — S161XXA Strain of muscle, fascia and tendon at neck level, initial encounter: Secondary | ICD-10-CM

## 2013-04-07 HISTORY — DX: Strain of muscle, fascia and tendon at neck level, initial encounter: S16.1XXA

## 2013-06-21 ENCOUNTER — Other Ambulatory Visit: Payer: Self-pay | Admitting: Neurosurgery

## 2013-06-21 DIAGNOSIS — S129XXA Fracture of neck, unspecified, initial encounter: Secondary | ICD-10-CM

## 2013-06-24 ENCOUNTER — Ambulatory Visit
Admission: RE | Admit: 2013-06-24 | Discharge: 2013-06-24 | Disposition: A | Discharge status: Home / Self Care | Payer: Commercial Managed Care - HMO | Source: Ambulatory Visit | Attending: Neurosurgery | Admitting: Neurosurgery

## 2013-06-24 DIAGNOSIS — S129XXA Fracture of neck, unspecified, initial encounter: Secondary | ICD-10-CM

## 2013-08-08 ENCOUNTER — Emergency Department (HOSPITAL_COMMUNITY): Payer: Medicare HMO

## 2013-08-08 ENCOUNTER — Emergency Department (HOSPITAL_COMMUNITY)
Admission: EM | Admit: 2013-08-08 | Discharge: 2013-08-08 | Disposition: A | Discharge status: Home / Self Care | Payer: Medicare HMO | Attending: Emergency Medicine | Admitting: Emergency Medicine

## 2013-08-08 ENCOUNTER — Encounter (HOSPITAL_COMMUNITY): Payer: Self-pay | Admitting: Emergency Medicine

## 2013-08-08 DIAGNOSIS — Z79899 Other long term (current) drug therapy: Secondary | ICD-10-CM | POA: Insufficient documentation

## 2013-08-08 DIAGNOSIS — F329 Major depressive disorder, single episode, unspecified: Secondary | ICD-10-CM | POA: Insufficient documentation

## 2013-08-08 DIAGNOSIS — Z8719 Personal history of other diseases of the digestive system: Secondary | ICD-10-CM | POA: Insufficient documentation

## 2013-08-08 DIAGNOSIS — G4459 Other complicated headache syndrome: Secondary | ICD-10-CM | POA: Insufficient documentation

## 2013-08-08 DIAGNOSIS — G4489 Other headache syndrome: Secondary | ICD-10-CM

## 2013-08-08 DIAGNOSIS — R110 Nausea: Secondary | ICD-10-CM | POA: Insufficient documentation

## 2013-08-08 DIAGNOSIS — R011 Cardiac murmur, unspecified: Secondary | ICD-10-CM | POA: Insufficient documentation

## 2013-08-08 DIAGNOSIS — IMO0002 Reserved for concepts with insufficient information to code with codable children: Secondary | ICD-10-CM | POA: Insufficient documentation

## 2013-08-08 DIAGNOSIS — F3289 Other specified depressive episodes: Secondary | ICD-10-CM | POA: Insufficient documentation

## 2013-08-08 DIAGNOSIS — Z8739 Personal history of other diseases of the musculoskeletal system and connective tissue: Secondary | ICD-10-CM | POA: Insufficient documentation

## 2013-08-08 LAB — CBC WITH DIFFERENTIAL/PLATELET
Basophils Absolute: 0 10*3/uL (ref 0.0–0.1)
Basophils Relative: 1 % (ref 0–1)
EOS ABS: 0.3 10*3/uL (ref 0.0–0.7)
Eosinophils Relative: 3 % (ref 0–5)
HCT: 39 % (ref 36.0–46.0)
Hemoglobin: 13.2 g/dL (ref 12.0–15.0)
LYMPHS ABS: 1.9 10*3/uL (ref 0.7–4.0)
Lymphocytes Relative: 25 % (ref 12–46)
MCH: 30.1 pg (ref 26.0–34.0)
MCHC: 33.8 g/dL (ref 30.0–36.0)
MCV: 89 fL (ref 78.0–100.0)
MONOS PCT: 8 % (ref 3–12)
Monocytes Absolute: 0.6 10*3/uL (ref 0.1–1.0)
Neutro Abs: 4.8 10*3/uL (ref 1.7–7.7)
Neutrophils Relative %: 63 % (ref 43–77)
PLATELETS: 275 10*3/uL (ref 150–400)
RBC: 4.38 MIL/uL (ref 3.87–5.11)
RDW: 12.3 % (ref 11.5–15.5)
WBC: 7.5 10*3/uL (ref 4.0–10.5)

## 2013-08-08 LAB — URINALYSIS, ROUTINE W REFLEX MICROSCOPIC
BILIRUBIN URINE: NEGATIVE
GLUCOSE, UA: NEGATIVE mg/dL
Hgb urine dipstick: NEGATIVE
KETONES UR: NEGATIVE mg/dL
Nitrite: NEGATIVE
PH: 5 (ref 5.0–8.0)
Protein, ur: NEGATIVE mg/dL
Specific Gravity, Urine: 1.019 (ref 1.005–1.030)
Urobilinogen, UA: 0.2 mg/dL (ref 0.0–1.0)

## 2013-08-08 LAB — BASIC METABOLIC PANEL
Anion gap: 11 (ref 5–15)
BUN: 16 mg/dL (ref 6–23)
CO2: 25 mEq/L (ref 19–32)
Calcium: 8.8 mg/dL (ref 8.4–10.5)
Chloride: 103 mEq/L (ref 96–112)
Creatinine, Ser: 0.82 mg/dL (ref 0.50–1.10)
GFR calc Af Amer: >90 mL/min (ref >90)
GFR, EST NON AFRICAN AMERICAN: 79 mL/min — AB (ref >90)
GLUCOSE: 90 mg/dL (ref 70–99)
Potassium: 4 mEq/L (ref 3.7–5.3)
SODIUM: 139 meq/L (ref 137–147)

## 2013-08-08 LAB — URINE MICROSCOPIC-ADD ON

## 2013-08-08 LAB — SEDIMENTATION RATE: Sed Rate: 15 mm/hr (ref 0–22)

## 2013-08-08 MED ORDER — KETOROLAC TROMETHAMINE 30 MG/ML IJ SOLN
30.0000 mg | Freq: Once | INTRAMUSCULAR | Status: DC
  Filled 2013-08-08: qty 1

## 2013-08-08 MED ORDER — OXYCODONE-ACETAMINOPHEN 5-325 MG PO TABS
1.0000 | ORAL_TABLET | Freq: Once | ORAL | Status: AC
  Administered 2013-08-08: 1 via ORAL
  Filled 2013-08-08: qty 1

## 2013-08-08 MED ORDER — HYDROCODONE-ACETAMINOPHEN 5-325 MG PO TABS: 1.0000 | ORAL_TABLET | ORAL | Status: DC | PRN

## 2013-08-08 MED ORDER — KETOROLAC TROMETHAMINE 30 MG/ML IJ SOLN
30.0000 mg | Freq: Once | INTRAMUSCULAR | Status: AC
  Administered 2013-08-08: 30 mg via INTRAMUSCULAR

## 2013-08-08 NOTE — ED Notes (Signed)
Pt returned from MRI °

## 2013-08-08 NOTE — ED Provider Notes (Signed)
7:01 AM Sign out at change of shift from Dr Kathrynn Humble.  Pt with headache, numbness right face.  If MRI negative, will refer to neurology for complex headache.    8:25 AM Pt reports pain presistes in head.  Headache over left head and tingling and numbness over left face and left head.  Pain 8/10.  Had epidural injection 3 weeks ago, Dr Sherwood Gambler, for pain in lumbar spine.  No fevers.  Pain unchanged in back.  No change in numbness in left leg.  No weakness, bowel or bladder incontinence.    10:16 AM MRI negative.  Discussed with patient.  Pain 6/10.  Will given Toradol prior to d/c.  Pt has itching reaction with some NSAIDs but can take others - never has had rash or difficulty breathing.   D/C home with pain medication, neurology follow up.   Discussed result, findings, treatment, and follow up  with patient.  Pt given return precautions.  Pt verbalizes understanding and agrees with plan.      Results for orders placed during the hospital encounter of 08/08/13  CBC WITH DIFFERENTIAL      Result Value Ref Range   WBC 7.5  4.0 - 10.5 K/uL   RBC 4.38  3.87 - 5.11 MIL/uL   Hemoglobin 13.2  12.0 - 15.0 g/dL   HCT 39.0  36.0 - 46.0 %   MCV 89.0  78.0 - 100.0 fL   MCH 30.1  26.0 - 34.0 pg   MCHC 33.8  30.0 - 36.0 g/dL   RDW 12.3  11.5 - 15.5 %   Platelets 275  150 - 400 K/uL   Neutrophils Relative % 63  43 - 77 %   Neutro Abs 4.8  1.7 - 7.7 K/uL   Lymphocytes Relative 25  12 - 46 %   Lymphs Abs 1.9  0.7 - 4.0 K/uL   Monocytes Relative 8  3 - 12 %   Monocytes Absolute 0.6  0.1 - 1.0 K/uL   Eosinophils Relative 3  0 - 5 %   Eosinophils Absolute 0.3  0.0 - 0.7 K/uL   Basophils Relative 1  0 - 1 %   Basophils Absolute 0.0  0.0 - 0.1 K/uL  BASIC METABOLIC PANEL      Result Value Ref Range   Sodium 139  137 - 147 mEq/L   Potassium 4.0  3.7 - 5.3 mEq/L   Chloride 103  96 - 112 mEq/L   CO2 25  19 - 32 mEq/L   Glucose, Bld 90  70 - 99 mg/dL   BUN 16  6 - 23 mg/dL   Creatinine, Ser 0.82  0.50 -  1.10 mg/dL   Calcium 8.8  8.4 - 10.5 mg/dL   GFR calc non Af Amer 79 (*) >90 mL/min   GFR calc Af Amer >90  >90 mL/min   Anion gap 11  5 - 15  SEDIMENTATION RATE      Result Value Ref Range   Sed Rate 15  0 - 22 mm/hr  URINALYSIS, ROUTINE W REFLEX MICROSCOPIC      Result Value Ref Range   Color, Urine YELLOW  YELLOW   APPearance CLOUDY (*) CLEAR   Specific Gravity, Urine 1.019  1.005 - 1.030   pH 5.0  5.0 - 8.0   Glucose, UA NEGATIVE  NEGATIVE mg/dL   Hgb urine dipstick NEGATIVE  NEGATIVE   Bilirubin Urine NEGATIVE  NEGATIVE   Ketones, ur NEGATIVE  NEGATIVE mg/dL  Protein, ur NEGATIVE  NEGATIVE mg/dL   Urobilinogen, UA 0.2  0.0 - 1.0 mg/dL   Nitrite NEGATIVE  NEGATIVE   Leukocytes, UA SMALL (*) NEGATIVE  URINE MICROSCOPIC-ADD ON      Result Value Ref Range   Squamous Epithelial / LPF FEW (*) RARE   WBC, UA 3-6  <3 WBC/hpf   RBC / HPF 0-2  <3 RBC/hpf   Bacteria, UA MANY (*) RARE   Ct Head Wo Contrast  08/08/2013   CLINICAL DATA:  HEADACHE  EXAM: CT HEAD WITHOUT CONTRAST  TECHNIQUE: Contiguous axial images were obtained from the base of the skull through the vertex without intravenous contrast.  COMPARISON:  None available.  FINDINGS: There is no acute intracranial hemorrhage or infarct. No mass lesion or midline shift. Gray-white matter differentiation is well maintained. Ventricles are normal in size without evidence of hydrocephalus. CSF containing spaces are within normal limits. No extra-axial fluid collection.  The calvarium is intact.  Orbital soft tissues are within normal limits.  The paranasal sinuses and mastoid air cells are well pneumatized and free of fluid.  Scalp soft tissues are unremarkable.  IMPRESSION: No acute intracranial abnormality.   Electronically Signed   By: Jeannine Boga M.D.   On: 08/08/2013 05:34   Mr Brain Wo Contrast  08/08/2013   CLINICAL DATA:  Headache.  Left facial numbness.  EXAM: MRI HEAD WITHOUT CONTRAST  TECHNIQUE: Multiplanar, multiecho  pulse sequences of the brain and surrounding structures were obtained without intravenous contrast.  COMPARISON:  Head CT same day.  FINDINGS: The brain has a normal appearance on all pulse sequences without evidence of malformation, atrophy, old or acute infarction, mass lesion, hemorrhage, hydrocephalus or extra-axial collection. No pituitary mass. No fluid in the sinuses, middle ears or mastoids. No skull or skullbase lesion. There is flow in the major vessels at the base of the brain. Major venous sinuses show flow.  IMPRESSION: Normal examination. No cause of the presenting symptoms is identified.   Electronically Signed   By: Nelson Chimes M.D.   On: 08/08/2013 10:00      Clayton Bibles, PA-C 08/08/13 1017

## 2013-08-08 NOTE — Progress Notes (Signed)
Patient also voices for concern for a "knot" in left axilla that she's only noticed for past few days.

## 2013-08-08 NOTE — ED Provider Notes (Signed)
CSN: [id number]     Arrival date & time 08/08/13  0205 History   First MD Initiated Contact with Patient 08/08/13 [phone]     Chief Complaint  Patient presents with  . Headache     (Consider location/radiation/quality/duration/timing/severity/associated sxs/prior Treatment) HPI Kathy [name] is a 58 y.o. female who presents to ED with complaint of a headache. States her symptoms began 4 days ago. Gradual in onset. Pain is in the left side of the head, radiates to the neck, states feels tingly. Denies numbness. Denies pain or visual changes in eyes. Pt is having some nausea, denies vomiting. States taking over the counter medications which help some. Denies any other focal neuro deficits. No hx of the same. No injuries.  Denies fever or chills. No neck pain or stiffness.   Past Medical History  Diagnosis Date  . Heart murmur     in the past, they cant hear it no  . Arthritis   . Nocturia   . Shortness of breath     with exertion  . Depression   . Constipation   . PONV (postoperative nausea and vomiting)    Past Surgical History  Procedure Laterality Date  . Cholecystectomy    . Abdominal hysterectomy    . Tubal ligation    . Cervical disc surgery      x 3  . Shoulder debridement      5- Right, 3 left  . Knee arthroscopy      right  . Posterior cervical fusion/foraminotomy  02/17/2012    Procedure: POSTERIOR CERVICAL FUSION/FORAMINOTOMY LEVEL 2;  Surgeon: Hosie Spangle, MD;  Location: [facility];  Service: Neurosurgery;  Laterality: N/A;  Cervical five to seven posterior arthrodesis  . Posterior cervical fusion/foraminotomy N/A 03/25/2012    Procedure: POSTERIOR CERVICAL FUSION/FORAMINOTOMY LEVEL 1;  Surgeon: Hosie Spangle, MD;  Location: [facility];  Service: Neurosurgery;  Laterality: N/A;  Cervical five-six posterior cervical arthrodesis and instrumentation    No family history on file. History  Substance Use Topics  . Smoking status: Never Smoker   .  Smokeless tobacco: Not on file  . Alcohol Use: Yes     Comment: rarely - , 1 per month   OB History   Grav Para Term Preterm Abortions TAB SAB Ect Mult Living                 Review of Systems  Constitutional: Negative for fever and chills.  HENT: Negative for congestion, ear pain and sinus pressure.   Eyes: Negative for photophobia, pain and visual disturbance.  Respiratory: Negative for cough, chest tightness and shortness of breath.   Cardiovascular: Negative for chest pain, palpitations and leg swelling.  Gastrointestinal: Positive for nausea. Negative for vomiting, abdominal pain and diarrhea.  Genitourinary: Negative for dysuria, flank pain, vaginal bleeding, vaginal discharge, vaginal pain and pelvic pain.  Musculoskeletal: Negative for arthralgias, myalgias, neck pain and neck stiffness.  Skin: Negative for rash.  Neurological: Positive for headaches. Negative for dizziness and weakness.  All other systems reviewed and are negative.     Allergies  Celecoxib and Naproxen sodium  Home Medications   Prior to Admission medications   Medication Sig Start Date End Date Taking? Authorizing Provider  Cholecalciferol (VITAMIN D3) 2000 UNITS capsule Take 2,000 Units by mouth daily.   Yes Historical Provider, MD  estradiol (ESTRACE) 0.5 MG tablet Take 0.5 mg by mouth daily.   Yes Historical Provider, MD  FLUoxetine (PROZAC) 40 MG  capsule Take 40 mg by mouth daily.   Yes Historical Provider, MD  PRESCRIPTION MEDICATION Take 1 tablet by mouth at bedtime. "for restless legs"   Yes Historical Provider, MD  PRESCRIPTION MEDICATION Take 1 tablet by mouth daily. "for cholesterol"   Yes Historical Provider, MD   BP 130/75  Pulse 86  Temp(Src) 97.6 F (36.4 C) (Oral)  Resp 18  Ht 5\' 3"  (1.6 m)  Wt 161 lb (73.029 kg)  BMI 28.53 kg/m2  SpO2 98% Physical Exam  Nursing note and vitals reviewed. Constitutional: She is oriented to person, place, and time. She appears well-developed and  well-nourished. No distress.  HENT:  Head: Normocephalic.  Right Ear: External ear normal.  Left Ear: External ear normal.  Nose: Nose normal.  Mouth/Throat: Oropharynx is clear and moist.  Eyes: Conjunctivae and EOM are normal. Pupils are equal, round, and reactive to light.  Neck: Normal range of motion. Neck supple.  Cardiovascular: Normal rate, regular rhythm and normal heart sounds.   Pulmonary/Chest: Effort normal and breath sounds normal. No respiratory distress. She has no wheezes. She has no rales.  Abdominal: Soft. Bowel sounds are normal. She exhibits no distension. There is no tenderness. There is no rebound.  Musculoskeletal: She exhibits no edema.  Neurological: She is alert and oriented to person, place, and time. No cranial nerve deficit. Coordination normal.  5/5 and equal upper and lower extremity strength bilaterally. Equal grip strength bilaterally. Normal finger to nose and heel to shin. No pronator drift. Sensation equal over bilateral face, upper and lower extremities.   Skin: Skin is warm and dry.  Psychiatric: She has a normal mood and affect. Her behavior is normal.    ED Course  Procedures (including critical care time) Labs Review Labs Reviewed  BASIC METABOLIC PANEL - Abnormal; Notable for the following:    GFR calc non Af Amer 79 (*)    All other components within normal limits  URINALYSIS, ROUTINE W REFLEX MICROSCOPIC - Abnormal; Notable for the following:    APPearance CLOUDY (*)    Leukocytes, UA SMALL (*)    All other components within normal limits  URINE MICROSCOPIC-ADD ON - Abnormal; Notable for the following:    Squamous Epithelial / LPF FEW (*)    Bacteria, UA MANY (*)    All other components within normal limits  CBC WITH DIFFERENTIAL  SEDIMENTATION RATE    Imaging Review No results found.   EKG Interpretation None      MDM   Final diagnoses:  Other headache syndrome    Patient here with headache and left face numbness. No  neuro deficits on examination however patient continues to say that she feels like her left face is nontender. Will get CT of the head, labs, urinalysis.   5:48 AM CT and labs are unremarkable. Patient may have a UTI, also cultures. Discussed with Dr.Nanavati, who advised to order an MRI to rule out CVA. I discussed all the results and plan with patient and she is agreeable.    Pt signed out at shift change pending MRI.   Filed Vitals:   08/08/13 0700 08/08/13 0730 08/08/13 0800 08/08/13 0935  BP: 128/50 136/62  131/52  Pulse: 67 70 93 65  Temp:      TempSrc:      Resp:      Height:      Weight:      SpO2: 96% 96% 96% 98%     Renold Genta, PA-C 08/09/13 0039

## 2013-08-08 NOTE — ED Notes (Signed)
Pt requesting more pain medicine. PA notified. 

## 2013-08-08 NOTE — Discharge Instructions (Signed)
Read the information below.  Use the prescribed medication as directed.  Please discuss all new medications with your pharmacist.  Do not take additional tylenol while taking the prescribed pain medication to avoid overdose.  You may return to the Emergency Department at any time for worsening condition or any new symptoms that concern you.    ° °You are having a headache. No specific cause was found today for your headache. It may have been a migraine or other cause of headache. Stress, anxiety, fatigue, and depression are common triggers for headaches. Your headache today does not appear to be life-threatening or require hospitalization, but often the exact cause of headaches is not determined in the emergency department. Therefore, follow-up with your doctor is very important to find out what may have caused your headache, and whether or not you need any further diagnostic testing or treatment. Sometimes headaches can appear benign (not harmful), but then more serious symptoms can develop which should prompt an immediate re-evaluation by your doctor or the emergency department. °SEEK MEDICAL ATTENTION IF: °You develop possible problems with medications prescribed.  °The medications don't resolve your headache, if it recurs , or if you have multiple episodes of vomiting or can't take fluids. °You have a change from the usual headache. °RETURN IMMEDIATELY IF you develop a sudden, severe headache or confusion, become poorly responsive or faint, develop a fever above 100.4F or problem breathing, have a change in speech, vision, swallowing, or understanding, or develop new weakness, numbness, tingling, incoordination, or have a seizure.  °

## 2013-08-08 NOTE — ED Notes (Signed)
The pt has had a headache since Wednesday with nausea.  She is visiting upstairs with a friend and ecided to have it checked.  Med takes the pain away for only a little while

## 2013-08-11 NOTE — ED Provider Notes (Signed)
Medical screening examination/treatment/procedure(s) were performed by non-physician practitioner and as supervising physician I was immediately available for consultation/collaboration.   EKG Interpretation None        , MD 08/11/13 0351 

## 2013-08-11 NOTE — ED Provider Notes (Signed)
Medical screening examination/treatment/procedure(s) were performed by non-physician practitioner and as supervising physician I was immediately available for consultation/collaboration.   EKG Interpretation None       Varney Biles, MD 08/11/13 [phone]

## 2013-08-19 ENCOUNTER — Encounter: Payer: Self-pay | Admitting: *Deleted

## 2013-08-26 ENCOUNTER — Ambulatory Visit (INDEPENDENT_AMBULATORY_CARE_PROVIDER_SITE_OTHER): Payer: Medicare HMO | Admitting: Neurology

## 2013-08-26 ENCOUNTER — Encounter: Payer: Self-pay | Admitting: Neurology

## 2013-08-26 VITALS — BP 140/90 | HR 70 | Resp 16 | Ht 63.0 in | Wt 156.0 lb

## 2013-08-26 DIAGNOSIS — M5481 Occipital neuralgia: Secondary | ICD-10-CM

## 2013-08-26 DIAGNOSIS — R51 Headache: Secondary | ICD-10-CM

## 2013-08-26 DIAGNOSIS — R519 Headache, unspecified: Secondary | ICD-10-CM

## 2013-08-26 DIAGNOSIS — M531 Cervicobrachial syndrome: Secondary | ICD-10-CM

## 2013-08-26 MED ORDER — BUPIVACAINE HCL 0.25 % IJ SOLN
3.0000 mL | Freq: Once | INTRAMUSCULAR | Status: AC
  Administered 2013-08-26: 3 mL

## 2013-08-26 MED ORDER — TRIAMCINOLONE ACETONIDE 40 MG/ML IJ SUSP
40.0000 mg | Freq: Once | INTRAMUSCULAR | Status: AC
  Administered 2013-08-26: 40 mg via INTRAMUSCULAR

## 2013-08-26 NOTE — Patient Instructions (Signed)
1.  Finish the topamax and when you have only a few pills left, call us and let us know how you are doing.  At that point, we can increase it to 2 tablets at bedtime or remain on 1 tablet depending on how you are doing. 2.  Stop caffeine and diet sodas/diet teas 3.  Try to go for walks. 4.  Stop the hydrocodone.  If it is really severe, try Naproxen or Motrin.  Limit use of all pain relievers to no more than 2 days out of the week. 5.  We will get CTA of the head. 6.  Follow up in 3 months.

## 2013-08-26 NOTE — Progress Notes (Signed)
NEUROLOGY CONSULTATION NOTE  Kathy [name] MRN: [id number] DOB: 10-14-1955  Referring provider: Henderson Newcomer, PA Primary care provider: Gilford Rile, MD  Reason for consult:  headache  HISTORY OF PRESENT ILLNESS: Kathy [name] is a 58 year old right-handed woman with history of cervical disc disease status post fusion and foraminotomy and depression who presents for headache.  Records and images personally reviewed.  Onset:  3 weeks ago Location:  Left side of head from suboccipital region up to behind the left eye Quality:  Squeezing Intensity:  6-7/10 Aura:  No Prodrome:  No Associated symptoms:  Maybe some nausea initially. Some photosensitivity. Duration:  Constant Frequency:  Constant Triggers/exacerbating factors:  None. It is not positional. Relieving factors:  None Activity:  Tries to stay in bed if she can. Has not been going for walks.  Past abortive therapy:  BC powder ineffective, sumatriptan 50 mg ineffective. Motrin minimally effective. Past preventative therapy:  None  Current abortive therapy:  Hydrocodone every night, Flexeril 10 mg Current preventative therapy:  Topamax 25mg  (started 08/12/13)  Caffeine:  Diet soda Alcohol:  occasionally Smoker:  no Diet:  Poor  Exercise:  Stopped walking due to headache Depression/stress:  yes Sleep hygiene:  poor Family history of headache:  No Has remote history of headache as a child  08/08/13 CT HEAD WO:  normal 08/08/13 MRI BRAIN WO:  normal 06/24/13 CT CERVICAL WO:  progressive interbody fusion at C5-6 and C6-7, interval posterior fusion from C5-T1.  She has had posterior cervical fusion and foraminotomy.  PAST MEDICAL HISTORY: Past Medical History  Diagnosis Date  . Heart murmur     in the past, they cant hear it no  . Arthritis   . Nocturia   . Shortness of breath     with exertion  . Depression   . Constipation   . PONV (postoperative nausea and vomiting)     PAST SURGICAL HISTORY: Past  Surgical History  Procedure Laterality Date  . Cholecystectomy    . Abdominal hysterectomy    . Tubal ligation    . Cervical disc surgery      x 3  . Shoulder debridement      5- Right, 3 left  . Knee arthroscopy      right  . Posterior cervical fusion/foraminotomy  02/17/2012    Procedure: POSTERIOR CERVICAL FUSION/FORAMINOTOMY LEVEL 2;  Surgeon: Hosie Spangle, MD;  Location: [facility];  Service: Neurosurgery;  Laterality: N/A;  Cervical five to seven posterior arthrodesis  . Posterior cervical fusion/foraminotomy N/A 03/25/2012    Procedure: POSTERIOR CERVICAL FUSION/FORAMINOTOMY LEVEL 1;  Surgeon: Hosie Spangle, MD;  Location: [facility];  Service: Neurosurgery;  Laterality: N/A;  Cervical five-six posterior cervical arthrodesis and instrumentation     MEDICATIONS: Current Outpatient Prescriptions on File Prior to Visit  Medication Sig Dispense Refill  . Cholecalciferol (VITAMIN D3) 2000 UNITS capsule Take 2,000 Units by mouth daily.      Marland Kitchen estradiol (ESTRACE) 0.5 MG tablet Take 0.5 mg by mouth daily.      Marland Kitchen FLUoxetine (PROZAC) 40 MG capsule Take 40 mg by mouth daily.      Marland Kitchen HYDROcodone-acetaminophen (NORCO/VICODIN) 5-325 MG per tablet Take 1-2 tablets by mouth every 4 (four) hours as needed for moderate pain or severe pain.  15 tablet  0   No current facility-administered medications on file prior to visit.    ALLERGIES: Allergies  Allergen Reactions  . Celecoxib Itching  . Naproxen Sodium Itching  FAMILY HISTORY: No family history on file.  SOCIAL HISTORY: History   Social History  . Marital Status: Divorced    Spouse Name: N/A    Number of Children: N/A  . Years of Education: N/A   Occupational History  . Not on file.   Social History Main Topics  . Smoking status: Never Smoker   . Smokeless tobacco: Not on file  . Alcohol Use: Yes     Comment: rarely - , 1 per month  . Drug Use: No  . Sexual Activity: Not on file   Other Topics Concern    . Not on file   Social History Narrative  . No narrative on file    REVIEW OF SYSTEMS: Constitutional: No fevers, chills, or sweats, no generalized fatigue, change in appetite Eyes: No visual changes, double vision, eye pain Ear, nose and throat: No hearing loss, ear pain, nasal congestion, sore throat Cardiovascular: No chest pain, palpitations Respiratory:  No shortness of breath at rest or with exertion, wheezes GastrointestinaI: No nausea, vomiting, diarrhea, abdominal pain, fecal incontinence Genitourinary:  No dysuria, urinary retention or frequency Musculoskeletal:  Neck and back pain Integumentary: No rash, pruritus, skin lesions Neurological: as above Psychiatric: Depression, anxiety Endocrine: No palpitations, fatigue, diaphoresis, mood swings, change in appetite, change in weight, increased thirst Hematologic/Lymphatic:  No anemia, purpura, petechiae. Allergic/Immunologic: no itchy/runny eyes, nasal congestion, recent allergic reactions, rashes  PHYSICAL EXAM: Filed Vitals:   08/26/13 1048  BP: 140/90  Pulse: 70  Resp: 16   General: No acute distress Head:  Normocephalic/atraumatic Neck: supple, no paraspinal tenderness, full range of motion, left suboccipital tenderness at the notch, causing a tingling sensation on her face Back: No paraspinal tenderness Heart: regular rate and rhythm Lungs: Clear to auscultation bilaterally. Vascular: No carotid bruits. Neurological Exam: Mental status: alert and oriented to person, place, and time, recent and remote memory intact, fund of knowledge intact, attention and concentration intact, speech fluent and not dysarthric, language intact. Cranial nerves: CN I: not tested CN II: pupils equal, round and reactive to light, visual fields intact, fundi unremarkable, without vessel changes, exudates, hemorrhages or papilledema. CN III, IV, VI:  full range of motion, no nystagmus, no ptosis CN V: facial sensation intact CN VII:  upper and lower face symmetric CN VIII: hearing intact CN IX, X: gag intact, uvula midline CN XI: sternocleidomastoid and trapezius muscles intact CN XII: tongue midline Bulk & Tone: normal, no fasciculations. Motor: 5 out of 5 throughout Sensation: Temperature and vibration intact Deep Tendon Reflexes: 2+ throughout, toes downgoing Finger to nose testing: No dysmetria Heel to shin: No dysmetria Gait: Normal station and stride. Able to turn and walk in tandem. Romberg negative.  IMPRESSION: New onset unilateral headache.  May be tension-type headache with left occipital neuralgia.  PLAN: 1.  Finish the topamax 25mg  at bedtime.  Instructed to call at end of prescription.  If improved, continue 25mg , otherwise will increase to 50mg  at bedtime. 2.  Stop caffeine and diet sodas/diet teas 3.  Try to go for walks. 4.  Stop the hydrocodone.  If it is really severe, try Naproxen or Motrin.  Limit use of all pain relievers to no more than 2 days out of the week. 5.  We will get CTA of the head. 6.  Occipital nerve block today 7.  Follow up in 3 months.  60 minutes that with the patient, over 50% spent discussing diagnosis and is well as performing occipital nerve block.  Thank  you for allowing me to take part in the care of this patient.  Metta Clines, DO  CC:  Gilford Rile, MD  Henderson Newcomer, PA

## 2013-08-26 NOTE — Procedures (Signed)
Procedure: Occipital nerve block  Procedure risks, benefits and alternative treatments explained to patient and they agree to proceed.   A concentration of 0.25% bupivacaine (3 ml) was mixed with 40 mg of Kenalog (1 ml). A 27 gauge needle was used for injection. The region of the left greater occipital nerve was located by palpation. The area was prepped with alcohol. A total of 2 cc of the above mixture was injected without difficulty. The patient tolerated the procedure well.    Adam R. Jaffe, DO  

## 2013-09-01 ENCOUNTER — Telehealth: Payer: Self-pay | Admitting: Neurology

## 2013-09-01 ENCOUNTER — Ambulatory Visit (HOSPITAL_COMMUNITY)
Admission: RE | Admit: 2013-09-01 | Discharge: 2013-09-01 | Disposition: A | Discharge status: Home / Self Care | Payer: Medicare HMO | Source: Ambulatory Visit | Attending: Neurology | Admitting: Neurology

## 2013-09-01 DIAGNOSIS — R51 Headache: Secondary | ICD-10-CM | POA: Diagnosis not present

## 2013-09-01 MED ORDER — IOHEXOL 350 MG/ML SOLN
50.0000 mL | Freq: Once | INTRAVENOUS | Status: AC | PRN
  Administered 2013-09-01: 50 mL via INTRAVENOUS

## 2013-09-01 NOTE — Telephone Encounter (Signed)
Returning someones call please call [phone]

## 2013-09-01 NOTE — Telephone Encounter (Signed)
Returned call to patient  She was in the process of having CT done will call when finished

## 2013-09-01 NOTE — Telephone Encounter (Signed)
That wouldn't be a typical symptom.  I have never encountered that, but I cannot say that it is not possible.  With cold-like symptoms, I would first have her speak with her PCP to see if she has seasonal allergies or viral illness first.  As for the headache, I would favor increasing the topamax to 50mg  at bedtime, if she agrees.

## 2013-09-01 NOTE — Telephone Encounter (Signed)
Pt reports she has a stuffy nose, congestion, and cough since starting Topamax. Is this related to being on Topamax? Also says she is still having headaches. She is scheduled for her CT scan today. CB# [phone]. OK to LM on machine regarding Topamax / Sherri S.

## 2013-09-01 NOTE — Telephone Encounter (Signed)
Patient is asking if the Topamax can cause severe coughing? This started about 1 week ago .Please advise

## 2013-09-01 NOTE — Telephone Encounter (Signed)
Patient wants to wait  Until she has taken the medication 4 weeks before she increases it  She will contact her PCP about cough

## 2013-09-03 ENCOUNTER — Telehealth: Payer: Self-pay | Admitting: Neurology

## 2013-09-03 NOTE — Telephone Encounter (Signed)
Left message for patient to call back to discuss MRI results.

## 2013-09-06 ENCOUNTER — Telehealth: Payer: Self-pay | Admitting: Neurology

## 2013-09-06 ENCOUNTER — Telehealth: Payer: Self-pay | Admitting: *Deleted

## 2013-09-06 NOTE — Telephone Encounter (Signed)
Message copied by Claudie Revering on Mon Sep 06, 2013  8:25 AM ------      Message from: JAFFE, ADAM R      Created: Fri Sep 03, 2013  4:54 PM       I left a message for patient to call back with results but she never did.  The CTA looks okay from my perspective.  There is evidence of narrowing of a segment of one of the arteries in the brain, which likely is congenital and not clinically relevant.  I think everything looks okay.  No aneurysms.      ----- Message -----         From: Rad Results In Interface         Sent: 09/01/2013   4:27 PM           To: Dudley Major, DO                   ------

## 2013-09-06 NOTE — Telephone Encounter (Signed)
Called patient no answer  Did not leave voice mail

## 2013-09-06 NOTE — Telephone Encounter (Signed)
Patient is tolerating the Topamax 25 mg  Daily ok but is still having headaches  She is down to 3 pills please advise

## 2013-09-06 NOTE — Telephone Encounter (Signed)
Pt called requesting to speak to a nurse regarding her MRI and meds. Please call pt c/b [phone]

## 2013-09-06 NOTE — Telephone Encounter (Signed)
Please tell her to increase topamax to 50mg  at bedtime (2 pills), if tolerating, we can send new prescription for 50mg  tablets.  Donika K. Posey Pronto, DO

## 2013-09-15 ENCOUNTER — Telehealth: Payer: Self-pay | Admitting: *Deleted

## 2013-09-15 NOTE — Telephone Encounter (Signed)
Patient has a question about one of her medication please advises Call back # [phone]

## 2013-09-27 ENCOUNTER — Telehealth: Payer: Self-pay | Admitting: Neurology

## 2013-09-27 NOTE — Telephone Encounter (Signed)
I left a message for patient to call back.

## 2013-09-27 NOTE — Telephone Encounter (Signed)
Patient called stating the Topamax  50 mg increase on 09/06/13 is not helping at all she has had a headache for 4 days with little to no relief . Please advise

## 2013-09-27 NOTE — Telephone Encounter (Signed)
Pt states that she needs to talk to someone about headaches please call [phone]

## 2013-09-27 NOTE — Telephone Encounter (Signed)
1.  When we change the dose of the preventative medication, such as topamax, she has to give it 4 weeks before making a change. 2.  Part of the problem was she has medication overuse headache.  Is she still taking hydrocodone?  If she has not stopped yet, she should take it no more than 5 days out of the week for one week, then 4 days out of the week for one week, then 3 days out of the week for one week, then 2 days out of the week for one week, then stop.   3.  We can prescribe prednisone 10mg  taper.  Take 6tabs x1day, then 5tabs x1day, then 4tabs x1day, then 3tabs x1day, then 2tabs x1day, then 1tab x1day, then STOP.  While on it, she cannot take other NSAIDs. 4.  When the headache is severe enough, she can take ibuprofen 400mg  or naproxen 500mg .

## 2013-09-28 NOTE — Telephone Encounter (Signed)
Patient states her headache is some better today . She also states she has been taking Requip up to 3 times a day  Which is not how the medication is prescribed . I advised her not to do that . She would like to wait for another day or so before she tried the Prednisone taper .

## 2013-10-04 ENCOUNTER — Other Ambulatory Visit: Payer: Self-pay | Admitting: *Deleted

## 2013-10-04 ENCOUNTER — Telehealth: Payer: Self-pay | Admitting: *Deleted

## 2013-10-04 MED ORDER — TOPIRAMATE 25 MG PO CPSP: 50.0000 mg | ORAL_CAPSULE | Freq: Every day | ORAL | Status: DC

## 2013-10-04 NOTE — Telephone Encounter (Signed)
Patient calling to see if she can increase her Topamax? Please advise

## 2013-10-04 NOTE — Telephone Encounter (Signed)
Yes, can increase to 75mg  at bedtime.

## 2013-10-04 NOTE — Telephone Encounter (Signed)
Patient given instructions and Rx sent in.  

## 2013-11-01 ENCOUNTER — Telehealth: Payer: Self-pay | Admitting: Neurology

## 2013-11-01 NOTE — Telephone Encounter (Signed)
Patient called to update medication . She states it is doing much better

## 2013-11-01 NOTE — Telephone Encounter (Signed)
Calling to provide 4 week med update. CB# [phone] / Sherri S.

## 2013-11-25 ENCOUNTER — Ambulatory Visit: Payer: Commercial Managed Care - HMO | Admitting: Neurology

## 2013-11-25 DIAGNOSIS — G4486 Cervicogenic headache: Secondary | ICD-10-CM

## 2013-11-25 HISTORY — DX: Cervicogenic headache: G44.86

## 2013-12-01 ENCOUNTER — Telehealth: Payer: Self-pay | Admitting: Neurology

## 2013-12-01 NOTE — Telephone Encounter (Signed)
Pt called to cancel her 12/03/13 f/u appt due to her not having the funds to pay her copay And she feels that physical therapy is working for her. Pt wants to know if she needs to be seen for a f/u? C/B [phone]

## 2013-12-01 NOTE — Telephone Encounter (Signed)
Patient was advised to keep follow up appt with Dr Tomi Likens

## 2013-12-03 ENCOUNTER — Ambulatory Visit: Payer: Medicare HMO | Admitting: Neurology

## 2013-12-03 NOTE — Telephone Encounter (Signed)
Pt currently has no follow up scheduled with Dr. Tomi Likens, see previous documentation / Sherri S.

## 2013-12-03 NOTE — Telephone Encounter (Signed)
I have made patient a follow up

## 2013-12-17 ENCOUNTER — Ambulatory Visit (INDEPENDENT_AMBULATORY_CARE_PROVIDER_SITE_OTHER): Payer: Medicare HMO | Admitting: Neurology

## 2013-12-17 ENCOUNTER — Encounter: Payer: Self-pay | Admitting: Neurology

## 2013-12-17 VITALS — BP 130/84 | HR 62 | Ht 63.0 in | Wt 151.0 lb

## 2013-12-17 DIAGNOSIS — G4486 Cervicogenic headache: Secondary | ICD-10-CM

## 2013-12-17 DIAGNOSIS — R51 Headache: Secondary | ICD-10-CM

## 2013-12-17 MED ORDER — TOPIRAMATE 25 MG PO TABS: 75.0000 mg | ORAL_TABLET | Freq: Every day | ORAL | Status: DC

## 2013-12-17 MED ORDER — CYCLOBENZAPRINE HCL 10 MG PO TABS: 10.0000 mg | ORAL_TABLET | Freq: Three times a day (TID) | ORAL | Status: DC | PRN

## 2013-12-17 NOTE — Progress Notes (Signed)
NEUROLOGY FOLLOW UP OFFICE NOTE  Kathy [name] [id number]  HISTORY OF PRESENT ILLNESS: Kathy [name] is a 58 year old right-handed woman with history of cervical disc disease status post fusion and foraminotomy and depression who follows up for unilateral tension-type headache.  CTA personally reviewed.  UPDATE: Intensity:  3-4/10 (6-7/10 this week) Duration:  Not intense enough to remember Frequency:  3-4x/month  Current abortive therapy:  None. Current preventative therapy:  topiramate 75mg  Prescribed tramadol for generalized pain, but takes sparingly.  Also Flexeril.  CTA of the head from 09/01/13 with hypoplastic right M1 segment with no intracranial aneurysms.  Caffeine:  Diet soda Alcohol:  occasionally Smoker:  no Diet:  Poor  Exercise:  Stopped walking due to headache Depression/stress:  yes Sleep hygiene:  poor  HISTORY: Onset:  3 weeks ago Location:  Left side of head from suboccipital region up to behind the left eye Quality:  Squeezing Initial intensity:  6-7/10 Aura:  No Prodrome:  No Associated symptoms:  Maybe some nausea initially. Some photosensitivity. Initial duration:  Constant Initial frequency:  Constant Triggers/exacerbating factors:  None. It is not positional. Relieving factors:  None Activity:  Tries to stay in bed if she can. Has not been going for walks.  Past abortive therapy:  BC powder ineffective, sumatriptan 50 mg ineffective. Motrin minimally effective. Past preventative therapy:  None  Current abortive therapy:  Hydrocodone every night, Flexeril 10 mg Current preventative therapy:  Topamax 25mg  (started 08/12/13)  Family history of headache:  No Has remote history of headache as a child  08/08/13 CT HEAD WO:  normal 08/08/13 MRI BRAIN WO:  normal 06/24/13 CT CERVICAL WO:  progressive interbody fusion at C5-6 and C6-7, interval posterior fusion from C5-T1.  She has had posterior cervical fusion and foraminotomy.  PAST MEDICAL  HISTORY: Past Medical History  Diagnosis Date  . Heart murmur     in the past, they cant hear it no  . Arthritis   . Nocturia   . Shortness of breath     with exertion  . Depression   . Constipation   . PONV (postoperative nausea and vomiting)     MEDICATIONS: Current Outpatient Prescriptions on File Prior to Visit  Medication Sig Dispense Refill  . Cholecalciferol (VITAMIN D3) 2000 UNITS capsule Take 2,000 Units by mouth daily.    Marland Kitchen estradiol (ESTRACE) 0.5 MG tablet Take 0.5 mg by mouth daily.    Marland Kitchen FLUoxetine (PROZAC) 40 MG capsule Take 40 mg by mouth daily.    Marland Kitchen lovastatin (MEVACOR) 20 MG tablet Take 20 mg by mouth daily.    Marland Kitchen rOPINIRole (REQUIP) 0.5 MG tablet Take 0.5 mg by mouth at bedtime.     No current facility-administered medications on file prior to visit.    ALLERGIES: Allergies  Allergen Reactions  . Celecoxib Itching  . Naproxen Sodium Itching    FAMILY HISTORY: No family history on file.  SOCIAL HISTORY: History   Social History  . Marital Status: Divorced    Spouse Name: N/A    Number of Children: N/A  . Years of Education: N/A   Occupational History  . Not on file.   Social History Main Topics  . Smoking status: Never Smoker   . Smokeless tobacco: Not on file  . Alcohol Use: Yes     Comment: rarely - , 1 per month  . Drug Use: No  . Sexual Activity: Not on file   Other Topics Concern  . Not on  file   Social History Narrative    REVIEW OF SYSTEMS: Constitutional: No fevers, chills, or sweats, no generalized fatigue, change in appetite Eyes: No visual changes, double vision, eye pain Ear, nose and throat: No hearing loss, ear pain, nasal congestion, sore throat Cardiovascular: No chest pain, palpitations Respiratory:  No shortness of breath at rest or with exertion, wheezes GastrointestinaI: No nausea, vomiting, diarrhea, abdominal pain, fecal incontinence Genitourinary:  No dysuria, urinary retention or frequency Musculoskeletal:   No neck pain, back pain Integumentary: No rash, pruritus, skin lesions Neurological: as above Psychiatric: No depression, insomnia, anxiety Endocrine: No palpitations, fatigue, diaphoresis, mood swings, change in appetite, change in weight, increased thirst Hematologic/Lymphatic:  No anemia, purpura, petechiae. Allergic/Immunologic: no itchy/runny eyes, nasal congestion, recent allergic reactions, rashes  PHYSICAL EXAM: Filed Vitals:   12/17/13 1100  BP: 130/84  Pulse: 62   General: No acute distress Head:  Normocephalic/atraumatic Eyes:  Fundoscopic exam unremarkable without vessel changes, exudates, hemorrhages or papilledema. Neck: supple, no paraspinal tenderness, full range of motion Heart:  Regular rate and rhythm Lungs:  Clear to auscultation bilaterally Back: No paraspinal tenderness Neurological Exam: alert and oriented to person, place, and time. Attention span and concentration intact, recent and remote memory intact, fund of knowledge intact.  Speech fluent and not dysarthric, language intact.  CN II-XII intact. Fundoscopic exam unremarkable without vessel changes, exudates, hemorrhages or papilledema.  Bulk and tone normal, muscle strength 5/5 throughout.  Sensation to light touch, temperature and vibration intact.  Deep tendon reflexes 2+ throughout, toes downgoing.  Finger to nose and heel to shin testing intact.  Gait normal, Romberg negative.  IMPRESSION: Unilateral tension-type headache, cervicogenic, improved Neck pain  PLAN: 1.  Continue topamax 75mg  2.  Cyclobenzaprine as needed for headache and neck pain. 3.  Okay to take tramadol for neck and knee pain, but not more than 2 days out of the week if possible to prevent rebound headache 4.  Follow up in 3 months.  15 minutes spent with patient, over 50% spent discussing further management.  Metta Clines, DO  CC:  Gilford Rile, MD  Henderson Newcomer, PA

## 2013-12-17 NOTE — Patient Instructions (Signed)
1.  Continue topamax 75mg  at bedtime 2.  Take flexeril 10mg  up to three times daily as needed for neck pain. 3.  Use tramadol sparingly 4.  Follow up in 12months.

## 2014-01-10 ENCOUNTER — Telehealth: Payer: Self-pay | Admitting: *Deleted

## 2014-01-10 ENCOUNTER — Telehealth: Payer: Self-pay | Admitting: Neurology

## 2014-01-10 NOTE — Telephone Encounter (Signed)
Patient called stating she is having headache more frequent  with tingling in both sides of face 12 day out of a month  taking excidrine2 days a week and topamax at night  she is taking nothing else for the pain she states .Please advise

## 2014-01-10 NOTE — Telephone Encounter (Signed)
Pt called wanting to speak to a nurse regarding her headaches. She is starting to have numbness on both sides of her face.  C/B [phone]

## 2014-01-10 NOTE — Telephone Encounter (Signed)
She can increase the topamax to 100mg  at bedtime.  Also, she can use the tramadol or Excedrin, but try to only take it no more than 2 days out of the week.

## 2014-01-11 ENCOUNTER — Telehealth: Payer: Self-pay | Admitting: *Deleted

## 2014-01-11 NOTE — Telephone Encounter (Signed)
Patient will increase Topamax to 100 mg at HS  and may take tramadol no morre than 2 times a week

## 2014-03-30 ENCOUNTER — Telehealth: Payer: Self-pay | Admitting: Neurology

## 2014-03-30 NOTE — Telephone Encounter (Signed)
Pt canceled her f/u for 04/01/14. Pt stated she has a lot going on and will call later to r/s.

## 2014-04-01 ENCOUNTER — Ambulatory Visit: Payer: Medicare HMO | Admitting: Neurology

## 2014-05-05 ENCOUNTER — Ambulatory Visit (INDEPENDENT_AMBULATORY_CARE_PROVIDER_SITE_OTHER): Payer: Medicare HMO | Admitting: Neurology

## 2014-05-05 ENCOUNTER — Encounter: Payer: Self-pay | Admitting: Neurology

## 2014-05-05 ENCOUNTER — Other Ambulatory Visit: Payer: Self-pay | Admitting: *Deleted

## 2014-05-05 VITALS — BP 126/70 | HR 70 | Temp 97.7°F | Resp 16 | Ht 63.0 in | Wt 153.7 lb

## 2014-05-05 DIAGNOSIS — G2581 Restless legs syndrome: Secondary | ICD-10-CM

## 2014-05-05 DIAGNOSIS — R200 Anesthesia of skin: Secondary | ICD-10-CM

## 2014-05-05 DIAGNOSIS — G4486 Cervicogenic headache: Secondary | ICD-10-CM

## 2014-05-05 DIAGNOSIS — R202 Paresthesia of skin: Secondary | ICD-10-CM | POA: Diagnosis not present

## 2014-05-05 DIAGNOSIS — G44219 Episodic tension-type headache, not intractable: Secondary | ICD-10-CM | POA: Diagnosis not present

## 2014-05-05 DIAGNOSIS — R51 Headache: Secondary | ICD-10-CM

## 2014-05-05 MED ORDER — NORTRIPTYLINE HCL 10 MG PO CAPS: 10.0000 mg | ORAL_CAPSULE | Freq: Every day | ORAL | Status: DC

## 2014-05-05 NOTE — Progress Notes (Addendum)
NEUROLOGY FOLLOW UP OFFICE NOTE  SAMAI COREA [id number]  HISTORY OF PRESENT ILLNESS: Kathy [name] is a 59 year old right-handed woman with history of cervical disc disease status post fusion and foraminotomy and depression who follows up for unilateral cervicogenic tension-type headache.  She also presents for restless leg symptoms.    UPDATE: In December, she began having increased headaches associated with bilateral facial numbness.  Topamax was increased to 100mg  at bedtime and the headaches improved (about 2 headaches over the past month).  She takes cyclobenzaprine for the headache, which helps.  Excedrin is not as effective.  She also presents for restless leg.  She developed it following her cervical spinal surgery.  Initially, it involved the arms and legs.  She describes increased pain and numbness and tingling in the legs.  It really got worse about 6 months ago, around the time she started the topiramate.  She takes requip, which is not effective.  In the past, she tried gabapentin which caused too much drowsiness.  She tried tramadol which she stopped due to her antidepressant.  HISTORY: Onset:  3 weeks ago Location:  Left side of head from suboccipital region up to behind the left eye Quality:  Squeezing Initial intensity:  6-7/10; November 3-4/10 (6-7/10 this week) Aura:  No Prodrome:  No Associated symptoms:  Maybe some nausea initially. Some photosensitivity. Initial duration:  Constant; November Not intense enough to remember Initial frequency:  Constant; November 3-4x/month Triggers/exacerbating factors:  None. It is not positional. Relieving factors:  None Activity:  Tries to stay in bed if she can. Has not been going for walks.  Past abortive therapy:  BC powder ineffective, sumatriptan 50 mg ineffective. Motrin minimally effective. 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 08/08/13 CT HEAD WO:  normal 08/08/13 MRI BRAIN WO:  normal 06/24/13 CT CERVICAL WO:  progressive interbody fusion at C5-6 and C6-7, interval posterior fusion from C5-T1.  She has had posterior cervical fusion and foraminotomy.  PAST MEDICAL HISTORY: Past Medical History  Diagnosis Date  . Heart murmur     in the past, they cant hear it no  . Arthritis   . Nocturia   . Shortness of breath     with exertion  . Depression   . Constipation   . PONV (postoperative nausea and vomiting)     MEDICATIONS: Current Outpatient Prescriptions on File Prior to Visit  Medication Sig Dispense Refill  . Cholecalciferol (VITAMIN D3) 2000 UNITS capsule Take 2,000 Units by mouth daily.    . cyclobenzaprine (FLEXERIL) 10 MG tablet Take 1 tablet (10 mg total) by mouth 3 (three) times daily as needed for muscle spasms. 90 tablet 3  . estradiol (ESTRACE) 0.5 MG tablet Take 0.5 mg by mouth daily.    Marland Kitchen FLUoxetine (PROZAC) 40 MG capsule Take 40 mg by mouth daily.    Marland Kitchen lovastatin (MEVACOR) 20 MG tablet Take 20 mg by mouth daily.     No current facility-administered medications on file prior to visit.    ALLERGIES: Allergies  Allergen Reactions  . Celecoxib Itching  . Naproxen Sodium Itching    FAMILY HISTORY: No family history on file.  SOCIAL HISTORY: History   Social History  . Marital Status: Divorced    Spouse Name: N/A  . Number of Children: N/A  .  Years of Education: N/A   Occupational History  . Not on file.   Social History Main Topics  . Smoking status: Never Smoker   . Smokeless tobacco: Never Used  . Alcohol Use: 0.0 oz/week    0 Standard drinks or equivalent per week     Comment: rarely - , 1 per month  . Drug Use: No  . Sexual Activity: No   Other Topics Concern  . Not on file   Social History Narrative    REVIEW OF SYSTEMS: Constitutional: No fevers, chills, or sweats, no generalized  fatigue, change in appetite Eyes: No visual changes, double vision, eye pain Ear, nose and throat: No hearing loss, ear pain, nasal congestion, sore throat Cardiovascular: No chest pain, palpitations Respiratory:  No shortness of breath at rest or with exertion, wheezes GastrointestinaI: No nausea, vomiting, diarrhea, abdominal pain, fecal incontinence Genitourinary:  No dysuria, urinary retention or frequency Musculoskeletal:  No neck pain, back pain Integumentary: No rash, pruritus, skin lesions Neurological: as above Psychiatric: No depression, insomnia, anxiety Endocrine: No palpitations, fatigue, diaphoresis, mood swings, change in appetite, change in weight, increased thirst Hematologic/Lymphatic:  No anemia, purpura, petechiae. Allergic/Immunologic: no itchy/runny eyes, nasal congestion, recent allergic reactions, rashes  PHYSICAL EXAM: Filed Vitals:   05/05/14 0842  BP: 126/70  Pulse: 70  Temp: 97.7 F (36.5 C)  Resp: 16   General: No acute distress Head:  Normocephalic/atraumatic Eyes:  Fundoscopic exam unremarkable without vessel changes, exudates, hemorrhages or papilledema. Neck: supple, no paraspinal tenderness, full range of motion Heart:  Regular rate and rhythm Lungs:  Clear to auscultation bilaterally Back: No paraspinal tenderness Neurological Exam: alert and oriented to person, place, and time. Attention span and concentration intact, recent and remote memory intact, fund of knowledge intact.  Speech fluent and not dysarthric, language intact.  CN II-XII intact. Fundoscopic exam unremarkable without vessel changes, exudates, hemorrhages or papilledema.  Bulk and tone normal, muscle strength 5/5 throughout.  Sensation to light touch, temperature and vibration intact.  Deep tendon reflexes 3+ throughout, toes downgoing.  Finger to nose and heel to shin testing intact.  Gait normal.  IMPRESSION: Unilateral cervicogenic tension-type headaches improved Restless leg  symptoms, likely related to prior cervical spinal surgery rather than idiopathic restless leg syndrome Numbness and tingling, related to above neuropathy or topiramate (given that it really started after starting topiramate)  PLAN: Will see if leg symptoms improved after tapering off and discontinuing the topiramate.  Instead we will start nortriptyline 10mg  at bedtime, which would address both the headache and the nerve pain.  She will call in 4 weeks with update.  She can take the cyclobenzaprine as needed for headache.  Since I don't believe she has idiopathic restless leg syndrome, I would discontinue Requip (it is not effective, anyway).  Follow up in 3 months.   Metta Clines, DO  CC: Letha Cape, MD

## 2014-05-05 NOTE — Patient Instructions (Addendum)
1.  We will taper off topamax.  Take 50mg  at bedtime for 3 days, then 25mg  at bedtime for 3 days, then stop. 2.  We will start nortriptyline 10mg  at bedtime 3.  Use cyclobenzaprine for neck and head pain as needed 4.  Stop requip 5.  Call in 4 weeks.  Follow up in 3 months.

## 2014-05-05 NOTE — Addendum Note (Signed)
Addended byTomi Likens, ADAM R on: 05/05/2014 09:24 AM   Modules accepted: Level of Service

## 2014-05-13 ENCOUNTER — Telehealth: Payer: Self-pay | Admitting: *Deleted

## 2014-05-13 NOTE — Telephone Encounter (Signed)
Patient states she is not feeling well she has not slept in 2 days  She is having leg pain and body ache she has stopped the Requip and topamax  . She also states she has a virus  With a ruptured ear drum I  advised her that more than likely the virus is why she is having leg pain. If she continues to feel this way she should contact her PCP

## 2014-05-13 NOTE — Telephone Encounter (Signed)
Patient having trouble with medication please advise Call back [phone]

## 2014-05-20 ENCOUNTER — Telehealth: Payer: Self-pay | Admitting: Neurology

## 2014-05-20 NOTE — Telephone Encounter (Signed)
Lmovm to return my call. 

## 2014-05-20 NOTE — Telephone Encounter (Signed)
Pt called wanting to speak to a nurse regarding some questions she has regarding her script for NORTRIPTYLINE 10mg . C/b [phone]

## 2014-05-20 NOTE — Telephone Encounter (Signed)
Pt is returning your call but she will not be home until around 3:30 please call her back after 3:30 [phone]

## 2014-05-20 NOTE — Telephone Encounter (Signed)
Patient states since she's been taking the Nortriptyline and it's "hyping" her up. She states at first she was taking it around 10:00 at night and not falling asleep until 2 or 3 in the morning. She states for the past few nights she's tried taking it around 9:00 and still having the same effect. She states she would try taking it in the morning but she doesn't want it to interact with other meds she takes for depression in the morning. Please advise.

## 2014-05-23 NOTE — Telephone Encounter (Signed)
Patient is aware It should be okay to take with her other antidepressants. I would give it a try first before switching to another agent

## 2014-05-23 NOTE — Telephone Encounter (Signed)
It should be okay to take with her other antidepressants.  I would give it a try first before switching to another agent.

## 2014-05-30 ENCOUNTER — Telehealth: Payer: Self-pay | Admitting: *Deleted

## 2014-05-30 ENCOUNTER — Other Ambulatory Visit: Payer: Self-pay | Admitting: *Deleted

## 2014-05-30 MED ORDER — ATENOLOL 50 MG PO TABS: 50.0000 mg | ORAL_TABLET | Freq: Every day | ORAL | Status: DC

## 2014-05-30 NOTE — Telephone Encounter (Signed)
I spoke with patient  Kathy [name] 50 mg #30 with 2 refill 1 po qd  Sent to pharmacy

## 2014-05-30 NOTE — Telephone Encounter (Signed)
Please advise on below note

## 2014-05-30 NOTE — Telephone Encounter (Signed)
The medication change is making her very tired and she has gained 8 pounds she would like to do something else (nortriptyline) Call back [phone]

## 2014-05-30 NOTE — Telephone Encounter (Signed)
She can stop the nortriptyline and we can start atenolol 50mg  daily.  I would give this dose 4 weeks before deciding on increasing it.

## 2014-06-20 ENCOUNTER — Encounter (HOSPITAL_COMMUNITY): Payer: Self-pay | Admitting: Emergency Medicine

## 2014-06-20 ENCOUNTER — Emergency Department (HOSPITAL_COMMUNITY)
Admission: EM | Admit: 2014-06-20 | Discharge: 2014-06-21 | Disposition: A | Discharge status: Home / Self Care | Payer: Medicare HMO | Attending: Emergency Medicine | Admitting: Emergency Medicine

## 2014-06-20 DIAGNOSIS — M1990 Unspecified osteoarthritis, unspecified site: Secondary | ICD-10-CM | POA: Diagnosis not present

## 2014-06-20 DIAGNOSIS — R109 Unspecified abdominal pain: Secondary | ICD-10-CM | POA: Insufficient documentation

## 2014-06-20 DIAGNOSIS — Z8719 Personal history of other diseases of the digestive system: Secondary | ICD-10-CM | POA: Diagnosis not present

## 2014-06-20 DIAGNOSIS — F329 Major depressive disorder, single episode, unspecified: Secondary | ICD-10-CM | POA: Diagnosis not present

## 2014-06-20 DIAGNOSIS — Z79899 Other long term (current) drug therapy: Secondary | ICD-10-CM | POA: Diagnosis not present

## 2014-06-20 DIAGNOSIS — M545 Low back pain, unspecified: Secondary | ICD-10-CM

## 2014-06-20 DIAGNOSIS — R110 Nausea: Secondary | ICD-10-CM | POA: Diagnosis not present

## 2014-06-20 DIAGNOSIS — R011 Cardiac murmur, unspecified: Secondary | ICD-10-CM | POA: Diagnosis not present

## 2014-06-20 DIAGNOSIS — R531 Weakness: Secondary | ICD-10-CM | POA: Insufficient documentation

## 2014-06-20 LAB — I-STAT CHEM 8, ED
BUN: 13 mg/dL (ref 6–20)
CHLORIDE: 106 mmol/L (ref 101–111)
Calcium, Ion: 1.18 mmol/L (ref 1.12–1.23)
Creatinine, Ser: 1 mg/dL (ref 0.44–1.00)
Glucose, Bld: 101 mg/dL — ABNORMAL HIGH (ref 65–99)
HCT: 45 % (ref 36.0–46.0)
Hemoglobin: 15.3 g/dL — ABNORMAL HIGH (ref 12.0–15.0)
Potassium: 4.2 mmol/L (ref 3.5–5.1)
SODIUM: 142 mmol/L (ref 135–145)
TCO2: 21 mmol/L (ref 0–100)

## 2014-06-20 LAB — URINALYSIS, ROUTINE W REFLEX MICROSCOPIC
BILIRUBIN URINE: NEGATIVE
Glucose, UA: NEGATIVE mg/dL
HGB URINE DIPSTICK: NEGATIVE
KETONES UR: NEGATIVE mg/dL
Leukocytes, UA: NEGATIVE
Nitrite: NEGATIVE
PROTEIN: NEGATIVE mg/dL
Specific Gravity, Urine: 1.02 (ref 1.005–1.030)
UROBILINOGEN UA: 0.2 mg/dL (ref 0.0–1.0)
pH: 6.5 (ref 5.0–8.0)

## 2014-06-20 LAB — CBC WITH DIFFERENTIAL/PLATELET
BASOS ABS: 0 10*3/uL (ref 0.0–0.1)
Basophils Relative: 1 % (ref 0–1)
Eosinophils Absolute: 0.4 10*3/uL (ref 0.0–0.7)
Eosinophils Relative: 5 % (ref 0–5)
HCT: 43.9 % (ref 36.0–46.0)
Hemoglobin: 14.7 g/dL (ref 12.0–15.0)
Lymphocytes Relative: 36 % (ref 12–46)
Lymphs Abs: 2.5 10*3/uL (ref 0.7–4.0)
MCH: 29 pg (ref 26.0–34.0)
MCHC: 33.5 g/dL (ref 30.0–36.0)
MCV: 86.6 fL (ref 78.0–100.0)
MONO ABS: 0.4 10*3/uL (ref 0.1–1.0)
Monocytes Relative: 5 % (ref 3–12)
NEUTROS ABS: 3.7 10*3/uL (ref 1.7–7.7)
Neutrophils Relative %: 53 % (ref 43–77)
Platelets: 246 10*3/uL (ref 150–400)
RBC: 5.07 MIL/uL (ref 3.87–5.11)
RDW: 12.4 % (ref 11.5–15.5)
WBC: 7 10*3/uL (ref 4.0–10.5)

## 2014-06-20 NOTE — ED Notes (Signed)
Pt c/o lower back pain and lower abd pain onset 4 days ago.  Pt st's pain started after turning the wrong way.  Nausea without vomiting.  Denies urinary symptoms

## 2014-06-20 NOTE — ED Provider Notes (Signed)
CSN: [id number]     Arrival date & time 06/20/14  2118 History  This chart was scribed for Kathy Fraise, MD by Rayfield Citizen, ED Scribe. This patient was seen in room D36C/D36C and the patient's care was started at 11:59 PM.    Chief Complaint  Patient presents with  . Abdominal Pain   Patient is a 59 y.o. female presenting with back pain. The history is provided by the patient and medical records. No language interpreter was used.  Back Pain Location:  Lumbar spine Quality:  Aching Radiates to: Lower abdomen  Pain severity:  Moderate Pain is:  Same all the time Onset quality:  Gradual Duration:  4 days Timing:  Constant Progression:  Unchanged Chronicity:  Recurrent Context comment:  Bending down from standing upright Relieved by:  Nothing Worsened by:  Ambulation, bending, sitting, palpation and twisting Ineffective treatments:  Muscle relaxants and narcotics Associated symptoms: abdominal pain, bladder incontinence and weakness   Associated symptoms: no chest pain, no dysuria, no fever, no headaches, no numbness and no perianal numbness   Risk factors: not pregnant and no recent surgery      HPI Comments: Kathy [name] is a 59 y.o. female who presents to the Emergency Department complaining of lower back pain beginning 4 days ago after bending down while in the shower. Her pain is exacerbated with movement, seated positions, walking. She reports that her back "regularly goes out" and describes her current pain as similar to her prior experiences with this. Patient reports that she exited her car two days PTA and had an episode of urinary incontinence; no other instances of urinary incontinence since but her bladder feels "abnormal." She explains that she occasionally experiences urinary difficulty and incontinence at baseline. No fecal incontinence.  Patient reports nausea and lower abdominal pain. She also notes slight weakness in her lower extremities. Patient has taken muscle  relaxants and hydrocodone without relief. She denies fevers, headaches, perianal numbness, weakness in her upper extremities, neck pain, chest pain, vomiting, dysuria, hematuria. She denies anticoagulant use.   Prior surgical history includes posterior cervical fusion/foraminotomy in January and February 2014 under Dr. Sherwood Gambler, as well as shoulder debridement, cholecystectomy, abdominal hysterectomy, tubal ligation No back surgery reported  Past Medical History  Diagnosis Date  . Heart murmur     in the past, they cant hear it no  . Arthritis   . Nocturia   . Shortness of breath     with exertion  . Depression   . Constipation   . PONV (postoperative nausea and vomiting)    Past Surgical History  Procedure Laterality Date  . Cholecystectomy    . Abdominal hysterectomy    . Tubal ligation    . Cervical disc surgery      x 3  . Shoulder debridement      5- Right, 3 left  . Knee arthroscopy      right  . Posterior cervical fusion/foraminotomy  02/17/2012    Procedure: POSTERIOR CERVICAL FUSION/FORAMINOTOMY LEVEL 2;  Surgeon: Hosie Spangle, MD;  Location: [facility];  Service: Neurosurgery;  Laterality: N/A;  Cervical five to seven posterior arthrodesis  . Posterior cervical fusion/foraminotomy N/A 03/25/2012    Procedure: POSTERIOR CERVICAL FUSION/FORAMINOTOMY LEVEL 1;  Surgeon: Hosie Spangle, MD;  Location: [place];  Service: Neurosurgery;  Laterality: N/A;  Cervical five-six posterior cervical arthrodesis and instrumentation    No family history on file. History  Substance Use Topics  . Smoking status:  Never Smoker   . Smokeless tobacco: Never Used  . Alcohol Use: 0.0 oz/week    0 Standard drinks or equivalent per week     Comment: rarely - , 1 per month   OB History    No data available     Review of Systems  Constitutional: Negative for fever.  Respiratory: Negative for shortness of breath.   Cardiovascular: Negative for chest pain.  Gastrointestinal:  Positive for nausea and abdominal pain. Negative for vomiting.  Genitourinary: Positive for bladder incontinence. Negative for dysuria and hematuria.  Musculoskeletal: Positive for back pain. Negative for neck pain.  Neurological: Positive for weakness. Negative for numbness and headaches.  All other systems reviewed and are negative.   Allergies  Celecoxib and Naproxen sodium  Home Medications   Prior to Admission medications   Medication Sig Start Date End Date Taking? Authorizing Provider  Cholecalciferol (VITAMIN D3) 2000 UNITS capsule Take 2,000 Units by mouth daily.   Yes Historical Provider, MD  cyclobenzaprine (FLEXERIL) 10 MG tablet Take 1 tablet (10 mg total) by mouth 3 (three) times daily as needed for muscle spasms. 12/17/13  Yes Adam Telford Nab, DO  estradiol (ESTRACE) 0.5 MG tablet Take 0.5 mg by mouth daily.   Yes Historical Provider, MD  FLUoxetine (PROZAC) 40 MG capsule Take 40 mg by mouth daily.   Yes Historical Provider, MD  lovastatin (MEVACOR) 20 MG tablet Take 20 mg by mouth daily.   Yes Historical Provider, MD  topiramate (TOPAMAX) 25 MG tablet Take 50 mg by mouth at bedtime.   Yes Historical Provider, MD  FLUoxetine (PROZAC) 20 MG capsule  04/28/14   Historical Provider, MD  oxyCODONE-acetaminophen (PERCOCET/ROXICET) 5-325 MG per tablet Take 1 tablet by mouth every 4 (four) hours as needed for severe pain. 06/21/14   Kathy Fraise, MD   BP 148/72 mmHg  Pulse 85  Temp(Src) 97.5 F (36.4 C) (Oral)  Resp 14  SpO2 100% Physical Exam  Nursing note and vitals reviewed.    CONSTITUTIONAL: Well developed/well nourished HEAD: Normocephalic/atraumatic EYES: EOMI/PERRL ENMT: Mucous membranes moist NECK: supple no meningeal signs SPINE/BACK: tenderness to low thoracic and upper lumbar, no bruising/crepitance/stepoffs noted to spine CV: S1/S2 noted, no murmurs/rubs/gallops noted LUNGS: Lungs are clear to auscultation bilaterally, no apparent distress ABDOMEN: soft,  nontender, no rebound or guarding GU:no cva tenderness NEURO: Awake/alert, no saddle anesthesia, rectal tone present (chaperone present), equal motor 5/5 strength noted with the following: hip flexion/knee flexion/extension, foot dorsi/plantar flexion, great toe extension intact bilaterally, no clonus bilaterally, plantar reflex appropriate (toes downgoing), no sensory deficit in any dermatome.  Equal patellar/achilles reflex noted (2+) in bilateral lower extremities. EXTREMITIES: pulses normal, full ROM SKIN: warm, color normal PSYCH: no abnormalities of mood noted, alert and oriented to situation  ED Course  Procedures   DIAGNOSTIC STUDIES: Oxygen Saturation is 100% on RA, normal by my interpretation.    COORDINATION OF CARE: 12:16 AM Discussed treatment plan with pt at bedside and pt agreed to plan. 1:09 AM Pt without any focal weakness in her lower extremities.  She is currently neurologically intact She has no signs of significant urinary retention Pt admits having episodes of incontinence even without back pain.  I don't feel this represents acute cauda equina.  Will treat pain and reassess 2:25 AM Pt improved She is ambulatory She feels comfortable for d/c home We discussed strict return precautions   Labs Review Labs Reviewed  URINALYSIS, ROUTINE W REFLEX MICROSCOPIC - Abnormal; Notable for the following:  APPearance CLOUDY (*)    All other components within normal limits  I-STAT CHEM 8, ED - Abnormal; Notable for the following:    Glucose, Bld 101 (*)    Hemoglobin 15.3 (*)    All other components within normal limits  CBC WITH DIFFERENTIAL/PLATELET    Medications  HYDROmorphone (DILAUDID) injection 1 mg (1 mg Intravenous Given 06/21/14 0031)  ondansetron (ZOFRAN) injection 4 mg (4 mg Intravenous Given 06/21/14 0031)  diazepam (VALIUM) injection 2.5 mg (2.5 mg Intravenous Given 06/21/14 0104)     MDM   Final diagnoses:  Acute low back pain    Nursing notes  including past medical history and social history reviewed and considered in documentation Labs/vital reviewed myself and considered during evaluation .dwwper   I personally performed the services described in this documentation, which was scribed in my presence. The recorded information has been reviewed and is accurate.      Kathy Fraise, MD 06/21/14 [phone]

## 2014-06-21 MED ORDER — ONDANSETRON HCL 4 MG/2ML IJ SOLN
4.0000 mg | Freq: Once | INTRAMUSCULAR | Status: AC
  Administered 2014-06-21: 4 mg via INTRAVENOUS
  Filled 2014-06-21: qty 2

## 2014-06-21 MED ORDER — HYDROMORPHONE HCL 1 MG/ML IJ SOLN
1.0000 mg | Freq: Once | INTRAMUSCULAR | Status: AC
  Administered 2014-06-21: 1 mg via INTRAVENOUS
  Filled 2014-06-21: qty 1

## 2014-06-21 MED ORDER — OXYCODONE-ACETAMINOPHEN 5-325 MG PO TABS: 1.0000 | ORAL_TABLET | ORAL | Status: DC | PRN

## 2014-06-21 MED ORDER — DIAZEPAM 5 MG/ML IJ SOLN
2.5000 mg | Freq: Once | INTRAMUSCULAR | Status: AC
  Administered 2014-06-21: 2.5 mg via INTRAVENOUS
  Filled 2014-06-21: qty 2

## 2014-06-21 NOTE — Discharge Instructions (Signed)

## 2014-06-21 NOTE — ED Notes (Signed)
Pt ambulated easier after pain medication. she reports much improvement- pain 4/10 in lower back. Denies pain at rest.

## 2014-06-21 NOTE — ED Notes (Signed)
Performed bladder scanner on patient and shows max of 73ml, patient then went to the bathroom and was able to urinate, performed scan after voiding and still found 31ml of urine

## 2014-06-24 ENCOUNTER — Other Ambulatory Visit: Payer: Self-pay | Admitting: Specialist

## 2014-06-24 ENCOUNTER — Ambulatory Visit
Admission: RE | Admit: 2014-06-24 | Discharge: 2014-06-24 | Disposition: A | Discharge status: Home / Self Care | Payer: Medicare HMO | Source: Ambulatory Visit | Attending: Specialist | Admitting: Specialist

## 2014-06-24 DIAGNOSIS — IMO0002 Reserved for concepts with insufficient information to code with codable children: Secondary | ICD-10-CM

## 2014-07-18 ENCOUNTER — Other Ambulatory Visit: Payer: Self-pay | Admitting: *Deleted

## 2014-07-18 ENCOUNTER — Telehealth: Payer: Self-pay | Admitting: Neurology

## 2014-07-18 MED ORDER — TOPIRAMATE 25 MG PO TABS: 25.0000 mg | ORAL_TABLET | Freq: Two times a day (BID) | ORAL | Status: DC

## 2014-07-18 NOTE — Telephone Encounter (Signed)
Topamax ordered for patient

## 2014-07-18 NOTE — Telephone Encounter (Signed)
Pt wants a month supply of the topmax called in pt phone number is [phone]

## 2014-08-10 ENCOUNTER — Ambulatory Visit (INDEPENDENT_AMBULATORY_CARE_PROVIDER_SITE_OTHER): Payer: Medicare HMO | Admitting: Neurology

## 2014-08-10 ENCOUNTER — Encounter: Payer: Self-pay | Admitting: Neurology

## 2014-08-10 VITALS — BP 118/74 | HR 68 | Resp 16 | Ht 63.0 in | Wt 155.7 lb

## 2014-08-10 DIAGNOSIS — G2581 Restless legs syndrome: Secondary | ICD-10-CM | POA: Diagnosis not present

## 2014-08-10 DIAGNOSIS — R51 Headache: Secondary | ICD-10-CM | POA: Diagnosis not present

## 2014-08-10 DIAGNOSIS — G4486 Cervicogenic headache: Secondary | ICD-10-CM

## 2014-08-10 MED ORDER — TOPIRAMATE 50 MG PO TABS: 50.0000 mg | ORAL_TABLET | Freq: Every day | ORAL | Status: DC

## 2014-08-10 NOTE — Patient Instructions (Signed)
1.  Continue topiramate 50mg  at bedtime (50mg  tablets ordered with 90 day supply) 2.  To reduce restless leg symptoms, consider switching to a non-SSRI antidepressant (such as Effexor) 3.  Start routine exercise (daily or at least most days per week) and diet.  Increase water intake 4.  Follow up in 4 months.

## 2014-08-10 NOTE — Progress Notes (Signed)
NEUROLOGY FOLLOW UP OFFICE NOTE  Kathy [name] [id number]  HISTORY OF PRESENT ILLNESS: Kathy [name] is a 59 year old right-handed woman with history of cervical disc disease status post fusion and foraminotomy and depression who follows up for unilateral cervicogenic tension-type headache and restless leg symptoms.  She is accompanied by her sister and brother-in-law who provide some history.  UPDATE: She was tapered off topiramate to see if numbness and tingling improved.  Instead, she was started on nortriptyline,which caused side effects and she had increased headaches.  She was switched back to topiramate, currently taking 50mg  at bedtime.  Headaches are much improved.  She gets a headache twice a month and is treated with Excedrin.  Sometimes, she takes cyclobenzaprine.  She resumed ropinirole because of increased restless leg symptoms.  For the past 1 to 2 months, she feels fatigued and naps often.  She no longer has numbness.  HISTORY: Onset:  3 weeks ago Location:  Left side of head from suboccipital region up to behind the left eye Quality:  Squeezing Initial intensity:  6-7/10; November 3-4/10 (6-7/10 this week) Aura:  No Prodrome:  No Associated symptoms:  Maybe some nausea initially. Some photosensitivity. Initial duration:  Constant; November Not intense enough to remember Initial frequency:  Constant; November 3-4x/month Triggers/exacerbating factors:  None. It is not positional. Relieving factors:  None Activity:  Tries to stay in bed if she can. Has not been going for walks.  Past abortive therapy:  BC powder ineffective, sumatriptan 50 mg ineffective. Motrin minimally effective. Past preventative therapy:  None  Family history of headache:  No Has remote history of headache as a child  09/01/13 CTA of the head showed hypoplastic right M1 segment with no intracranial aneurysms. 08/08/13 CT HEAD WO:  normal 08/08/13 MRI BRAIN WO:  normal 06/24/13 CT CERVICAL WO:   progressive interbody fusion at C5-6 and C6-7, interval posterior fusion from C5-T1.  She has had posterior cervical fusion and foraminotomy.  She also has restless leg symptoms.  She developed it following her cervical spinal surgery.  Initially, it involved the arms and legs.  She describes increased pain and numbness and tingling in the legs.  It really got worse about 9 months ago, around the time she started the topiramate.  She takes requip, which is not effective.  In the past, she tried gabapentin which caused too much drowsiness.  She tried tramadol which she stopped due to her antidepressant.  PAST MEDICAL HISTORY: Past Medical History  Diagnosis Date  . Heart murmur     in the past, they cant hear it no  . Arthritis   . Nocturia   . Shortness of breath     with exertion  . Depression   . Constipation   . PONV (postoperative nausea and vomiting)     MEDICATIONS: Current Outpatient Prescriptions on File Prior to Visit  Medication Sig Dispense Refill  . Cholecalciferol (VITAMIN D3) 2000 UNITS capsule Take 2,000 Units by mouth daily.    . cyclobenzaprine (FLEXERIL) 10 MG tablet Take 1 tablet (10 mg total) by mouth 3 (three) times daily as needed for muscle spasms. 90 tablet 3  . estradiol (ESTRACE) 0.5 MG tablet Take 0.5 mg by mouth daily.    Marland Kitchen FLUoxetine (PROZAC) 20 MG capsule     . FLUoxetine (PROZAC) 40 MG capsule Take 40 mg by mouth daily.    Marland Kitchen lovastatin (MEVACOR) 20 MG tablet Take 20 mg by mouth daily.    Marland Kitchen  oxyCODONE-acetaminophen (PERCOCET/ROXICET) 5-325 MG per tablet Take 1 tablet by mouth every 4 (four) hours as needed for severe pain. 15 tablet 0  . [DISCONTINUED] atenolol (TENORMIN) 50 MG tablet Take 1 tablet (50 mg total) by mouth daily. (Patient not taking: Reported on 06/21/2014) 30 tablet 2  . [DISCONTINUED] nortriptyline (PAMELOR) 10 MG capsule Take 1 capsule (10 mg total) by mouth at bedtime. (Patient not taking: Reported on 06/21/2014) 30 capsule 0   No current  facility-administered medications on file prior to visit.    ALLERGIES: Allergies  Allergen Reactions  . Celecoxib Itching  . Mobic [Meloxicam] Itching  . Naproxen Sodium Itching    FAMILY HISTORY: No family history on file.  SOCIAL HISTORY: History   Social History  . Marital Status: Divorced    Spouse Name: N/A  . Number of Children: N/A  . Years of Education: N/A   Occupational History  . Not on file.   Social History Main Topics  . Smoking status: Never Smoker   . Smokeless tobacco: Never Used  . Alcohol Use: 0.0 oz/week    0 Standard drinks or equivalent per week     Comment: rarely - , 1 per month  . Drug Use: No  . Sexual Activity: No   Other Topics Concern  . Not on file   Social History Narrative    REVIEW OF SYSTEMS: Constitutional: No fevers, chills, or sweats, no generalized fatigue, change in appetite Eyes: No visual changes, double vision, eye pain Ear, nose and throat: No hearing loss, ear pain, nasal congestion, sore throat Cardiovascular: No chest pain, palpitations Respiratory:  No shortness of breath at rest or with exertion, wheezes GastrointestinaI: No nausea, vomiting, diarrhea, abdominal pain, fecal incontinence Genitourinary:  No dysuria, urinary retention or frequency Musculoskeletal:  No neck pain, back pain Integumentary: No rash, pruritus, skin lesions Neurological: as above Psychiatric: No depression, insomnia, anxiety Endocrine: No palpitations, fatigue, diaphoresis, mood swings, change in appetite, change in weight, increased thirst Hematologic/Lymphatic:  No anemia, purpura, petechiae. Allergic/Immunologic: no itchy/runny eyes, nasal congestion, recent allergic reactions, rashes  PHYSICAL EXAM: Filed Vitals:   08/10/14 1245  BP: 118/74  Pulse: 68  Resp: 16   General: No acute distress Head:  Normocephalic/atraumatic Eyes:  Fundoscopic exam unremarkable without vessel changes, exudates, hemorrhages or papilledema. Neck:  supple, no paraspinal tenderness, full range of motion Heart:  Regular rate and rhythm Lungs:  Clear to auscultation bilaterally Back: No paraspinal tenderness Neurological Exam: alert and oriented to person, place, and time. Attention span and concentration intact, recent and remote memory intact, fund of knowledge intact.  Speech fluent and not dysarthric, language intact.  CN II-XII intact. Fundoscopic exam unremarkable without vessel changes, exudates, hemorrhages or papilledema.  Bulk and tone normal, muscle strength 5/5 throughout.  Sensation to light touch, temperature and vibration intact.  Deep tendon reflexes 3+ throughout, toes downgoing.  Finger to nose and heel to shin testing intact.  Gait normal.  IMPRESSION: Unilateral cervicogenic tension-type headaches improved Restless leg symptoms, likely related to prior cervical spinal surgery rather than idiopathic restless leg syndrome  PLAN: 1.  Fatigue may very well be due to medication.  However, we have to weigh the pros and cons.  She has finally done well with topiramate (and she didn't do well when we discontinued it).  I would continue topiramate 50mg  at bedtime. 2.  Excedrin or cyclobenzaprine for abortive therapy. 3.  Regarding restless leg symptoms, may continue ropinirole.  May take twice daily since she  has symptoms during the day.  SSRIs may exacerbate symptoms, so consider switching Prozac to a non-SSRI antidepressant, such as venlafaxine. 4.  To address fatigue, recommend routine exercise and healthy diet and hydration. 5.  Follow up in 4 months.  15 minutes spent face to face with patient, over 50% spent discussing management.  Metta Clines, DO  CC:  Gilford Rile, MD

## 2014-08-12 ENCOUNTER — Ambulatory Visit: Payer: Medicare HMO | Admitting: Neurology

## 2014-12-12 ENCOUNTER — Ambulatory Visit: Payer: Medicare HMO | Admitting: Neurology

## 2015-04-04 DIAGNOSIS — M8580 Other specified disorders of bone density and structure, unspecified site: Secondary | ICD-10-CM

## 2015-04-04 DIAGNOSIS — R5381 Other malaise: Secondary | ICD-10-CM

## 2015-04-04 DIAGNOSIS — M159 Polyosteoarthritis, unspecified: Secondary | ICD-10-CM

## 2015-04-04 DIAGNOSIS — J329 Chronic sinusitis, unspecified: Secondary | ICD-10-CM

## 2015-04-04 DIAGNOSIS — E782 Mixed hyperlipidemia: Secondary | ICD-10-CM | POA: Insufficient documentation

## 2015-04-04 DIAGNOSIS — R5383 Other fatigue: Secondary | ICD-10-CM

## 2015-04-04 HISTORY — DX: Other malaise: R53.81

## 2015-04-04 HISTORY — DX: Polyosteoarthritis, unspecified: M15.9

## 2015-04-04 HISTORY — DX: Other specified disorders of bone density and structure, unspecified site: M85.80

## 2015-04-04 HISTORY — DX: Mixed hyperlipidemia: E78.2

## 2015-04-04 HISTORY — DX: Chronic sinusitis, unspecified: J32.9

## 2015-04-05 DIAGNOSIS — E538 Deficiency of other specified B group vitamins: Secondary | ICD-10-CM

## 2015-04-05 DIAGNOSIS — G2581 Restless legs syndrome: Secondary | ICD-10-CM

## 2015-04-05 DIAGNOSIS — M51369 Other intervertebral disc degeneration, lumbar region without mention of lumbar back pain or lower extremity pain: Secondary | ICD-10-CM

## 2015-04-05 DIAGNOSIS — F3340 Major depressive disorder, recurrent, in remission, unspecified: Secondary | ICD-10-CM

## 2015-04-05 DIAGNOSIS — E559 Vitamin D deficiency, unspecified: Secondary | ICD-10-CM

## 2015-04-05 DIAGNOSIS — K219 Gastro-esophageal reflux disease without esophagitis: Secondary | ICD-10-CM | POA: Insufficient documentation

## 2015-04-05 DIAGNOSIS — M5136 Other intervertebral disc degeneration, lumbar region: Secondary | ICD-10-CM | POA: Insufficient documentation

## 2015-04-05 HISTORY — DX: Other intervertebral disc degeneration, lumbar region without mention of lumbar back pain or lower extremity pain: M51.369

## 2015-04-05 HISTORY — DX: Major depressive disorder, recurrent, in remission, unspecified: F33.40

## 2015-04-05 HISTORY — DX: Restless legs syndrome: G25.81

## 2015-04-05 HISTORY — DX: Vitamin D deficiency, unspecified: E55.9

## 2015-04-05 HISTORY — DX: Deficiency of other specified B group vitamins: E53.8

## 2015-08-14 DIAGNOSIS — F419 Anxiety disorder, unspecified: Secondary | ICD-10-CM | POA: Insufficient documentation

## 2015-10-03 ENCOUNTER — Other Ambulatory Visit: Payer: Self-pay | Admitting: Orthopedic Surgery

## 2015-10-03 DIAGNOSIS — M25562 Pain in left knee: Principal | ICD-10-CM

## 2015-10-03 DIAGNOSIS — M25561 Pain in right knee: Secondary | ICD-10-CM

## 2015-10-13 ENCOUNTER — Ambulatory Visit
Admission: RE | Admit: 2015-10-13 | Discharge: 2015-10-13 | Disposition: A | Discharge status: Home / Self Care | Payer: Medicare HMO | Source: Ambulatory Visit | Attending: Orthopedic Surgery | Admitting: Orthopedic Surgery

## 2015-10-13 DIAGNOSIS — M25561 Pain in right knee: Secondary | ICD-10-CM

## 2015-10-13 DIAGNOSIS — M25562 Pain in left knee: Principal | ICD-10-CM

## 2015-11-22 DIAGNOSIS — R55 Syncope and collapse: Secondary | ICD-10-CM

## 2015-11-22 DIAGNOSIS — R0609 Other forms of dyspnea: Secondary | ICD-10-CM

## 2015-11-22 DIAGNOSIS — R0602 Shortness of breath: Secondary | ICD-10-CM | POA: Insufficient documentation

## 2015-11-22 HISTORY — DX: Syncope and collapse: R55

## 2015-11-22 HISTORY — DX: Other forms of dyspnea: R06.09

## 2016-03-28 ENCOUNTER — Ambulatory Visit: Payer: Self-pay | Admitting: Allergy and Immunology

## 2016-03-29 ENCOUNTER — Encounter: Payer: Self-pay | Admitting: Allergy and Immunology

## 2016-03-29 ENCOUNTER — Ambulatory Visit (INDEPENDENT_AMBULATORY_CARE_PROVIDER_SITE_OTHER): Payer: Medicare HMO | Admitting: Allergy and Immunology

## 2016-03-29 VITALS — BP 130/84 | HR 80 | Temp 97.9°F | Resp 16 | Ht 62.52 in | Wt 164.8 lb

## 2016-03-29 DIAGNOSIS — L308 Other specified dermatitis: Secondary | ICD-10-CM | POA: Diagnosis not present

## 2016-03-29 DIAGNOSIS — K219 Gastro-esophageal reflux disease without esophagitis: Secondary | ICD-10-CM | POA: Diagnosis not present

## 2016-03-29 DIAGNOSIS — J3089 Other allergic rhinitis: Secondary | ICD-10-CM

## 2016-03-29 DIAGNOSIS — J4531 Mild persistent asthma with (acute) exacerbation: Secondary | ICD-10-CM | POA: Diagnosis not present

## 2016-03-29 DIAGNOSIS — L989 Disorder of the skin and subcutaneous tissue, unspecified: Secondary | ICD-10-CM

## 2016-03-29 MED ORDER — DEXLANSOPRAZOLE 60 MG PO CPDR: 60.0000 mg | DELAYED_RELEASE_CAPSULE | Freq: Every day | ORAL | 5 refills | Status: DC

## 2016-03-29 MED ORDER — RANITIDINE HCL 300 MG PO TABS
300.0000 mg | ORAL_TABLET | Freq: Every day | ORAL | 5 refills | Status: DC
Start: 2016-03-29 — End: 2018-06-12

## 2016-03-29 MED ORDER — BECLOMETHASONE DIPROP HFA 80 MCG/ACT IN AERB: 2.0000 | INHALATION_SPRAY | Freq: Two times a day (BID) | RESPIRATORY_TRACT | 3 refills | Status: DC

## 2016-03-29 MED ORDER — MOMETASONE FUROATE 0.1 % EX OINT: TOPICAL_OINTMENT | Freq: Every day | CUTANEOUS | 3 refills | Status: DC

## 2016-03-29 NOTE — Patient Instructions (Addendum)
  1. Allergen avoidance measures  2. Treat reflux:   A. slowly taper off caffeine and chocolate consumption  B. Dexilant 60mg  one tablet in AM  C. ranitidine 300 mg one tablet in PM  3. Treat inflammation:   A. Qvar 80 redihaler - 2 inhalations twice a day  B. OTC Nasacort - one spray each nostril one time per day  C. continue montelukast 10 mg tablet 1 time per day  D. stop Breo  4. If needed:   A. ProAir HFA 2 puffs every 4-6 hours  B. nasal saline  C. mometasone 0.1% ointment applied to skin lesions one time per day  5. Blood - IgA/G/M, IgE, anti-pneumococcal antibody, antitetanus antibody  6. Return to clinic in 3 weeks or earlier if problem

## 2016-03-29 NOTE — Progress Notes (Signed)
Dear Dr. Bea Graff,  Thank you for referring Kathy [name] to the [facility] on 03/29/2016.   Below is a summation of this patient's evaluation and recommendations.  Thank you for your referral. I will keep you informed about this patient's response to treatment.   If you have any questions please do not hesitate to contact me.   Sincerely,  Jiles Prows, MD Allergy / Immunology [facility]   ______________________________________________________________________    NEW PATIENT NOTE  Referring Provider: Raina Mina., MD Primary Provider: Gilford Rile, MD Date of office visit: 03/29/2016    Subjective:   Chief Complaint:  Kathy [name] (DOB: 1955/08/17) is a 61 y.o. female who presents to the clinic on 03/29/2016 with a chief complaint of Asthma ( coughing, wheezing, shortness of breath); headaches; and Skin Problem (random spots, itchy) .     HPI: Kathy [name] presents to this clinic in evaluation of persistent respiratory tract symptoms.  Apparently over the course of the past year she has noticed that she may have developed some wheezing while exercising. She also had a history of developing "bronchitis" about 3 times per year for many years although last year she did not have any episodes. Her "bronchitis" was manifested as unrelenting cough.  On 02/11/2016 she developed what she calls a head cold with some nasal congestion and sneezing and coughing. Although her upper airway symptoms have resolved except for some slight stuffiness she has continued to have unrelenting cough ever since that event. She has coughing associated with gagging and retching but no frank emesis. She has constant throat clearing and she feels as though something is hung in her throat. She may have some shortness of breath when she is developing her coughing episodes. She has "strangling" with her coughing  episodes. She does not have any difficulty swallowing. She does have a tickle in her throat. None of this appears to respond to a short acting bronchodilator.  Therapy for this recent episode has included multiple courses of systemic steroids and antibiotics and bronchodilator. She was evaluated by a local pulmonologist who placed her on Breo which has not helped her at all. Her short acting bronchodilator does not help her. She had a chest CT scan and a chest x-ray which apparently was normal. She's had no significant environmental change that could explain why her lungs and airway are inflamed.  It should be noted that she has GERD and has required esophageal dilation several times in the past by Dr. Melina Copa. It should also be noted that she stopped all her reflux medicines around the point in time in which she developed her episode in January. She's been without any reflux medicines for 2 months. She drinks one soda per day and has chocolate daily and rarely has alcohol.  She has also had sinus surgery in December 2016 by Dr. Gaylyn Cheers. Apparently she had obstruction on the left side and sinus surgery alleviated that issue. At the same time that she had sinus surgery she also had placement of ear ventilation tubes. Presently she has some stuffiness but that's the only respiratory tract symptoms she has involving her upper airway at this point in time. She can smell without any difficulty and does not have a history of ugly nasal discharge.  Kathy [name] has also noticed that her skin has been changing. She's been having some itchy spots on her skin that she excoriates and she  has developed nodules. There is no obvious reason why she is itchy. There is no significant environmental factor that is given rise to this itchiness. She's apparently had some blood tests in the past year which have been normal.  Past Medical History:  Diagnosis Date  . Arthritis   . Asthma   . Constipation   . Depression   . Heart murmur     in the past, they cant hear it no  . Nocturia   . PONV (postoperative nausea and vomiting)   . Shortness of breath    with exertion    Past Surgical History:  Procedure Laterality Date  . ABDOMINAL HYSTERECTOMY    . CERVICAL DISC SURGERY     x 3  . CHOLECYSTECTOMY    . KNEE ARTHROSCOPY     right  . POSTERIOR CERVICAL FUSION/FORAMINOTOMY  02/17/2012   Procedure: POSTERIOR CERVICAL FUSION/FORAMINOTOMY LEVEL 2;  Surgeon: Hosie Spangle, MD;  Location: [facility];  Service: Neurosurgery;  Laterality: N/A;  Cervical five to seven posterior arthrodesis  . POSTERIOR CERVICAL FUSION/FORAMINOTOMY N/A 03/25/2012   Procedure: POSTERIOR CERVICAL FUSION/FORAMINOTOMY LEVEL 1;  Surgeon: Hosie Spangle, MD;  Location: [facility];  Service: Neurosurgery;  Laterality: N/A;  Cervical five-six posterior cervical arthrodesis and instrumentation   . SHOULDER DEBRIDEMENT     5- Right, 3 left  . SINOSCOPY    . TUBAL LIGATION      Allergies as of 03/29/2016      Reactions   Celecoxib Itching   Mobic [meloxicam] Itching   Naproxen Sodium Itching      Medication List      BREO ELLIPTA 200-25 MCG/INH Aepb Generic drug:  fluticasone furoate-vilanterol Inhale 1 puff into the lungs daily.   cyclobenzaprine 10 MG tablet Commonly known as:  FLEXERIL Take 1 tablet (10 mg total) by mouth 3 (three) times daily as needed for muscle spasms.   estradiol 0.5 MG tablet Commonly known as:  ESTRACE Take 0.5 mg by mouth daily.   FLUoxetine 40 MG capsule Commonly known as:  PROZAC Take 40 mg by mouth daily.   furosemide 20 MG tablet Commonly known as:  LASIX Take 20 mg by mouth daily.   lovastatin 20 MG tablet Commonly known as:  MEVACOR Take 20 mg by mouth daily.   montelukast 10 MG tablet Commonly known as:  SINGULAIR Take 10 mg by mouth at bedtime.   PROAIR HFA 108 (90 Base) MCG/ACT inhaler Generic drug:  albuterol Inhale 2 puffs into the lungs every 4 (four) hours as needed for  wheezing or shortness of breath.   rOPINIRole 0.5 MG tablet Commonly known as:  REQUIP Take 10 mg by mouth 2 (two) times daily.   Vitamin D3 2000 units capsule Take 2,000 Units by mouth daily.       Review of systems negative except as noted in HPI / PMHx or noted below:  Review of Systems  Constitutional: Negative.   HENT: Negative.   Eyes: Negative.   Respiratory: Negative.   Cardiovascular: Negative.   Gastrointestinal: Negative.   Genitourinary: Negative.   Musculoskeletal: Negative.   Skin: Negative.   Neurological: Negative.   Endo/Heme/Allergies: Negative.   Psychiatric/Behavioral: Negative.     Family History  Problem Relation Age of Onset  . Heart disease Mother   . Diabetes Mother   . Heart disease Father   . Diabetes Sister   . High blood pressure Brother   . Colon cancer Maternal Aunt   .  Heart disease Paternal Uncle   . Heart disease Maternal Grandfather   . Heart disease Paternal Grandmother   . Heart disease Paternal Grandfather   . High blood pressure Brother   . Heart disease Brother   . Stroke Brother   . Diabetes Brother     Social History   Social History  . Marital status: Divorced    Spouse name: N/A  . Number of children: N/A  . Years of education: N/A   Occupational History  . Not on file.   Social History Main Topics  . Smoking status: Never Smoker  . Smokeless tobacco: Never Used  . Alcohol use 0.0 oz/week     Comment: rarely - , 1 per month  . Drug use: No  . Sexual activity: No   Other Topics Concern  . Not on file   Social History Narrative  . No narrative on file    Environmental and Social history  Lives in a house with a dry environment, cats and dogs located inside the household, no carpeting in the bedroom, no plastic on the bed or pillow, and no smoking ongoing with inside the household. She is on disability for a cervical neck issue.  Objective:   Vitals:   03/29/16 0859  BP: 130/84  Pulse: 80    Resp: 16  Temp: 97.9 F (36.6 C)   Height: 5' 2.52" (158.8 cm) Weight: 164 lb 12.8 oz (74.8 kg)  Physical Exam  Constitutional: She is well-developed, well-nourished, and in no distress.  Raspy voice, throat clearing.  HENT:  Head: Normocephalic.  Right Ear: Tympanic membrane, external ear and ear canal normal.  Left Ear: Tympanic membrane, external ear and ear canal normal.  Nose: Nose normal. No mucosal edema or rhinorrhea.  Mouth/Throat: Uvula is midline, oropharynx is clear and moist and mucous membranes are normal. No oropharyngeal exudate.  Eyes: Conjunctivae are normal.  Neck: Trachea normal. No tracheal tenderness present. No tracheal deviation present. No thyromegaly present.  Cardiovascular: Normal rate, regular rhythm, S1 normal, S2 normal and normal heart sounds.   No murmur heard. Pulmonary/Chest: Breath sounds normal. No stridor. No respiratory distress. She has no wheezes. She has no rales.  Musculoskeletal: She exhibits no edema.  Lymphadenopathy:       Head (right side): No tonsillar adenopathy present.       Head (left side): No tonsillar adenopathy present.    She has no cervical adenopathy.  Neurological: She is alert. Gait normal.  Skin: Rash (Multiple excoriated nodules with slight erythema involving trunk and upper extremity.) noted. She is not diaphoretic. No erythema. Nails show no clubbing.  Psychiatric: Mood and affect normal.    Diagnostics: Allergy skin tests were performed. She did not demonstrate any significant hypersensitivity against a screening panel of aeroallergens or foods.  Spirometry was performed and demonstrated an FEV1 of 2.03 @ 85 % of predicted. Following the administration of nebulized albuterol her FEV1 rose to 2.18 which was 85% of predicted.  Results of a chest CT scan refers to subsegmental atelectasis towards the bases of the lower lobes, right middle lobe, and lingula. There is also mention of mild bronchiectasis towards the  lung bases.  Results of blood tests obtained on 20 to January 2018 identified normal hepatic and renal function, white blood cell count 6.7 with a absolute eosinophil count of 100, hemoglobin 14.6, platelet 263  Assessment and Plan:    1. Asthma, not well controlled, mild persistent, with acute exacerbation   2. Other  allergic rhinitis   3. LPRD (laryngopharyngeal reflux disease)   4. Inflammatory dermatosis     1. Allergen avoidance measures?  2. Treat reflux:   A. slowly taper off caffeine and chocolate consumption  B. Dexilant 60mg  one tablet in AM  C. ranitidine 300 mg one tablet in PM  3. Treat inflammation:   A. Qvar 80 redihaler - 2 inhalations twice a day  B. OTC Nasacort - one spray each nostril one time per day  C. continue montelukast 10 mg tablet 1 time per day  D. stop Breo  4. If needed:   A. ProAir HFA 2 puffs every 4-6 hours  B. nasal saline  C. mometasone 0.1% ointment applied to skin lesions one time per day  5. Blood - IgA/G/M, IgE, anti-pneumococcal antibody, antitetanus antibody  6. Return to clinic in 3 weeks or earlier if problem  I suspect that Kathy [name] has significant inflammation and irritation of her respiratory tract although the exact etiologic agent responsible for this issue is unknown. She does have a history very consistent with reflux-induced respiratory disease and I'm going to treat her with a combination therapy noted above to address that issue while she continues to use anti-inflammatory medications for her respiratory tract. As well, she appears to have some form of inflammatory dermatosis and we'll treat her with a topical steroid at this point. I will contact her with the results of her blood tests which are being performed to investigate for an adequate immune system regarding control of respiratory pathogens. I'll see her back in this clinic in 3 weeks or earlier if there is a problem.  Jiles Prows, MD [facility]

## 2016-04-01 ENCOUNTER — Telehealth: Payer: Self-pay | Admitting: *Deleted

## 2016-04-01 MED ORDER — PANTOPRAZOLE SODIUM 40 MG PO TBEC: 40.0000 mg | DELAYED_RELEASE_TABLET | Freq: Every day | ORAL | 5 refills | Status: DC

## 2016-04-01 MED ORDER — FLUTICASONE PROPIONATE HFA 110 MCG/ACT IN AERO: 2.0000 | INHALATION_SPRAY | Freq: Two times a day (BID) | RESPIRATORY_TRACT | 5 refills | Status: DC

## 2016-04-01 NOTE — Addendum Note (Signed)
Addended by: Carin Hock on: 04/01/2016 01:55 PM   Modules accepted: Orders

## 2016-04-01 NOTE — Telephone Encounter (Signed)
Patient advised that you gave her samples of Qvar and Dexilant and sent RX. She advised she cannot afford meds Qvar $200 and Dexilant $100. What do you advise? The formulary shows Pantoprazole tier 1, Omeprazole 2 and nexium and prevacid 3. Qvar noncovered per formulary show Arnuity and Flovent covered at tier 3

## 2016-04-01 NOTE — Telephone Encounter (Signed)
Flovent 110, pantoprazole 40 mg

## 2016-04-01 NOTE — Telephone Encounter (Signed)
Patient advised of changes RX sent

## 2016-04-06 LAB — PNEUMOCOCCAL IM (14 SEROTYPE)
PNEUMO AB TYPE 19 (19F): 7.1 ug/mL (ref >1.3)
PNEUMO AB TYPE 26 (6B): 1.7 ug/mL (ref >1.3)
Pneumo Ab Type 1*: 0.8 ug/mL — ABNORMAL LOW (ref >1.3)
Pneumo Ab Type 12 (12F)*: 0.3 ug/mL — ABNORMAL LOW (ref >1.3)
Pneumo Ab Type 14*: >46.7 ug/mL (ref >1.3)
Pneumo Ab Type 23 (23F)*: 1.1 ug/mL — ABNORMAL LOW (ref >1.3)
Pneumo Ab Type 3*: 1.8 ug/mL (ref >1.3)
Pneumo Ab Type 4*: 0.6 ug/mL — ABNORMAL LOW (ref >1.3)
Pneumo Ab Type 51 (7F)*: 0.2 ug/mL — ABNORMAL LOW (ref >1.3)
Pneumo Ab Type 56 (18C)*: 6.7 ug/mL (ref >1.3)
Pneumo Ab Type 57 (19A)*: 1.9 ug/mL (ref >1.3)
Pneumo Ab Type 68 (9V)*: 1.4 ug/mL (ref >1.3)
Pneumo Ab Type 8*: 1.1 ug/mL — ABNORMAL LOW (ref >1.3)
Pneumo Ab Type 9 (9N)*: 0.4 ug/mL — ABNORMAL LOW (ref >1.3)

## 2016-04-06 LAB — TETANUS ANTIBODY, IGG: Tetanus Ab, IgG: 1.31 IU/mL (ref <0.10)

## 2016-04-06 LAB — IGG, IGA, IGM
IgA/Immunoglobulin A, Serum: 221 mg/dL (ref 87–352)
IgG (Immunoglobin G), Serum: 844 mg/dL (ref 700–1600)
IgM (Immunoglobulin M), Srm: 80 mg/dL (ref 26–217)

## 2016-04-06 LAB — IGE: IgE (Immunoglobulin E), Serum: 26 [IU]/mL (ref 0–100)

## 2016-04-19 ENCOUNTER — Ambulatory Visit (INDEPENDENT_AMBULATORY_CARE_PROVIDER_SITE_OTHER): Payer: Medicare HMO | Admitting: Allergy and Immunology

## 2016-04-19 ENCOUNTER — Telehealth: Payer: Self-pay | Admitting: *Deleted

## 2016-04-19 ENCOUNTER — Encounter: Payer: Self-pay | Admitting: Allergy and Immunology

## 2016-04-19 VITALS — BP 138/84 | HR 80 | Resp 22

## 2016-04-19 DIAGNOSIS — K219 Gastro-esophageal reflux disease without esophagitis: Secondary | ICD-10-CM | POA: Diagnosis not present

## 2016-04-19 DIAGNOSIS — J3089 Other allergic rhinitis: Secondary | ICD-10-CM | POA: Diagnosis not present

## 2016-04-19 DIAGNOSIS — L308 Other specified dermatitis: Secondary | ICD-10-CM

## 2016-04-19 DIAGNOSIS — L989 Disorder of the skin and subcutaneous tissue, unspecified: Secondary | ICD-10-CM

## 2016-04-19 DIAGNOSIS — J4530 Mild persistent asthma, uncomplicated: Secondary | ICD-10-CM

## 2016-04-19 NOTE — Patient Instructions (Addendum)
  1. Visit with [facility] at [facility]  2. Continue to Treat reflux:   A. slowly taper off caffeine and chocolate consumption  B. Pantoprazole 40 mg one tablet in AM  C. ranitidine 300 mg one tablet in PM  3. Continue to Treat inflammation:   A. Qvar 80 redihaler - 2 inhalations twice a day  B. OTC Nasacort - one spray each nostril one time per day  C. continue montelukast 10 mg tablet 1 time per day  4. If needed:   A. ProAir HFA 2 puffs every 4-6 hours  B. nasal saline  C. mometasone 0.1% ointment applied to skin lesions one time per day  5. Return to clinic in 8 weeks or earlier if problem

## 2016-04-19 NOTE — Telephone Encounter (Signed)
Patient gave verbal consent for me to leave her appointment information with [facility] on her voicemail. Left message informing her that her appointment is on March 28th @ 9:20 am with Dr. Joya Gaskins.  Their number is [phone] if she needs to reschedule.

## 2016-04-19 NOTE — Progress Notes (Signed)
Follow-up Note  Referring Provider: Raina Mina., MD Primary Provider: Gilford Rile, MD Date of Office Visit: 04/19/2016  Subjective:   Kathy [name] (DOB: 1955-03-27) is a 61 y.o. female who returns to the [facility] [place] on 04/19/2016 in re-evaluation of the following:  HPI: Kathy [name] returns to this clinic in reevaluation of her asthma and allergic rhinitis and reflux-induced respiratory disease. I last saw her in his clinic during her initial evaluation of 03/29/2016 at which point in time we made a plan to address each of these issues.  She is really not that much better. She still is coughing and throat clearing and has drainage and intermittent choking and raspy voice. She has been very good about using medications which includes a combination of anti-inflammatory medications for her respiratory tract and treatment directed against reflux. She has tapered off most of her caffeine and only drinks caffeine about twice a week. She has tapered off most of her chocolate and uses chocolate about twice a week.  As well, when she was last seen in this clinic she had some type of inflammatory dermatosis for which we gave her topical steroids to use. This has resulted in dramatic improvement regarding her skin lesions.  Allergies as of 04/19/2016      Reactions   Celecoxib Itching   Mobic [meloxicam] Itching   Naproxen Sodium Itching      Medication List      cyclobenzaprine 10 MG tablet Commonly known as:  FLEXERIL Take 1 tablet (10 mg total) by mouth 3 (three) times daily as needed for muscle spasms.   estradiol 0.5 MG tablet Commonly known as:  ESTRACE Take 0.5 mg by mouth daily.   FLUoxetine 40 MG capsule Commonly known as:  PROZAC Take 40 mg by mouth daily.   fluticasone 110 MCG/ACT inhaler Commonly known as:  FLOVENT HFA Inhale 2 puffs into the lungs 2 (two) times daily.   furosemide 20 MG tablet Commonly known as:  LASIX Take 20 mg by mouth daily.     lovastatin 20 MG tablet Commonly known as:  MEVACOR Take 20 mg by mouth daily.   mometasone 0.1 % ointment Commonly known as:  ELOCON Apply topically daily.   montelukast 10 MG tablet Commonly known as:  SINGULAIR Take 10 mg by mouth at bedtime.   pantoprazole 40 MG tablet Commonly known as:  PROTONIX Take 1 tablet (40 mg total) by mouth daily.   PROAIR HFA 108 (90 Base) MCG/ACT inhaler Generic drug:  albuterol Inhale 2 puffs into the lungs every 4 (four) hours as needed for wheezing or shortness of breath.   QVAR REDIHALER 80 MCG/ACT Aerb Generic drug:  Beclomethasone Diprop HFA Inhale 2 puffs into the lungs 2 (two) times daily.   ranitidine 300 MG tablet Commonly known as:  ZANTAC Take 1 tablet (300 mg total) by mouth at bedtime.   rOPINIRole 0.5 MG tablet Commonly known as:  REQUIP Take 10 mg by mouth 2 (two) times daily.   Vitamin D3 2000 units capsule Take 2,000 Units by mouth daily.       Past Medical History:  Diagnosis Date  . Arthritis   . Asthma   . Constipation   . Depression   . Heart murmur    in the past, they cant hear it no  . Nocturia   . PONV (postoperative nausea and vomiting)   . Shortness of breath    with exertion    Past Surgical History:  Procedure Laterality Date  . ABDOMINAL HYSTERECTOMY    . CERVICAL DISC SURGERY     x 3  . CHOLECYSTECTOMY    . KNEE ARTHROSCOPY     right  . POSTERIOR CERVICAL FUSION/FORAMINOTOMY  02/17/2012   Procedure: POSTERIOR CERVICAL FUSION/FORAMINOTOMY LEVEL 2;  Surgeon: Hosie Spangle, MD;  Location: [place] [facility];  Service: Neurosurgery;  Laterality: N/A;  Cervical five to seven posterior arthrodesis  . POSTERIOR CERVICAL FUSION/FORAMINOTOMY N/A 03/25/2012   Procedure: POSTERIOR CERVICAL FUSION/FORAMINOTOMY LEVEL 1;  Surgeon: Hosie Spangle, MD;  Location: [place];  Service: Neurosurgery;  Laterality: N/A;  Cervical five-six posterior cervical arthrodesis and instrumentation   . SHOULDER  DEBRIDEMENT     5- Right, 3 left  . SINOSCOPY    . TUBAL LIGATION      Review of systems negative except as noted in HPI / PMHx or noted below:  Review of Systems  Constitutional: Negative.   HENT: Negative.   Eyes: Negative.   Respiratory: Negative.   Cardiovascular: Negative.   Gastrointestinal: Negative.   Genitourinary: Negative.   Musculoskeletal: Negative.   Skin: Negative.   Neurological: Negative.   Endo/Heme/Allergies: Negative.   Psychiatric/Behavioral: Negative.      Objective:   Vitals:   04/19/16 1016  BP: 138/84  Pulse: 80  Resp: (!) 22          Physical Exam  Constitutional: She is well-developed, well-nourished, and in no distress.  Coughing, throat clearing, intermittent choking  HENT:  Head: Normocephalic.  Right Ear: External ear and ear canal normal. Tympanic membrane is not scarred (Wax).  Left Ear: External ear and ear canal normal. Tympanic membrane is scarred.  Nose: Nose normal. No mucosal edema or rhinorrhea.  Mouth/Throat: Uvula is midline, oropharynx is clear and moist and mucous membranes are normal. No oropharyngeal exudate.  Eyes: Conjunctivae are normal.  Neck: Trachea normal. No tracheal tenderness present. No tracheal deviation present. No thyromegaly present.  Cardiovascular: Normal rate, regular rhythm, S1 normal, S2 normal and normal heart sounds.   No murmur heard. Pulmonary/Chest: Breath sounds normal. No stridor. No respiratory distress. She has no wheezes. She has no rales.  Musculoskeletal: She exhibits no edema.  Lymphadenopathy:       Head (right side): No tonsillar adenopathy present.       Head (left side): No tonsillar adenopathy present.    She has no cervical adenopathy.  Neurological: She is alert. Gait normal.  Skin: Rash (At least 50% improvement regarding size and induration and erythema affecting nodules located on trunk and upper extremity.) noted. She is not diaphoretic. No erythema. Nails show no clubbing.   Psychiatric: Mood and affect normal.    Diagnostics: Results of blood tests obtained obtained 03/29/2016 identified a IgG 844, IgM 80, IgA 221 MG/DL and a IgE 26 international units/mL. She had adequate levels of antibodies directed against tetanus and several serotypes of pneumococcus.   Spirometry was performed and demonstrated an FEV1 of 1.86 at 74 % of predicted. She had difficult time performing this maneuver secondary to cough.  Assessment and Plan:   1. Asthma, well controlled, mild persistent   2. Other allergic rhinitis   3. LPRD (laryngopharyngeal reflux disease)   4. Inflammatory dermatosis     1. Visit with [facility] at [facility]  2. Continue to Treat reflux:   A. slowly taper off caffeine and chocolate consumption  B. Pantoprazole 40 mg one tablet in AM  C. ranitidine 300 mg one tablet in PM  3. Continue to Treat inflammation:   A. Qvar 80 redihaler - 2 inhalations twice a day  B. OTC Nasacort - one spray each nostril one time per day  C. continue montelukast 10 mg tablet 1 time per day  4. If needed:   A. ProAir HFA 2 puffs every 4-6 hours  B. nasal saline  C. mometasone 0.1% ointment applied to skin lesions one time per day  5. Return to clinic in 8 weeks or earlier if problem  I think that Sheletha should visit with the voice disorder Center at [facility] for her cough associated with significant laryngeal issues. We will get that arranged as soon as possible. She will continue to use anti-inflammatory agents for her respiratory tract and also continued to use aggressive therapy directed against reflux at this point. I will see her back in this clinic in 8 weeks.  Allena Katz, MD Allergy / Immunology [place]

## 2016-05-08 ENCOUNTER — Telehealth: Payer: Self-pay | Admitting: Allergy and Immunology

## 2016-05-08 ENCOUNTER — Other Ambulatory Visit: Payer: Self-pay | Admitting: *Deleted

## 2016-05-08 MED ORDER — MONTELUKAST SODIUM 10 MG PO TABS: ORAL_TABLET | ORAL | 1 refills | Status: DC

## 2016-05-08 NOTE — Telephone Encounter (Signed)
Kathy [name] would like her SINGULAIR sent to the [facility] for a 3 month supply.

## 2016-05-08 NOTE — Telephone Encounter (Signed)
RX sent to [facility].  

## 2016-05-20 DIAGNOSIS — R600 Localized edema: Secondary | ICD-10-CM

## 2016-05-20 DIAGNOSIS — I10 Essential (primary) hypertension: Secondary | ICD-10-CM | POA: Insufficient documentation

## 2016-05-20 HISTORY — DX: Localized edema: R60.0

## 2016-05-20 HISTORY — DX: Essential (primary) hypertension: I10

## 2016-06-17 ENCOUNTER — Ambulatory Visit: Payer: Medicare HMO | Admitting: Allergy and Immunology

## 2016-07-02 DIAGNOSIS — E039 Hypothyroidism, unspecified: Secondary | ICD-10-CM

## 2016-07-02 HISTORY — DX: Hypothyroidism, unspecified: E03.9

## 2016-10-04 ENCOUNTER — Other Ambulatory Visit: Payer: Self-pay | Admitting: *Deleted

## 2016-10-04 MED ORDER — BECLOMETHASONE DIPROP HFA 80 MCG/ACT IN AERB: INHALATION_SPRAY | RESPIRATORY_TRACT | 0 refills | Status: DC

## 2016-10-08 ENCOUNTER — Other Ambulatory Visit: Payer: Self-pay | Admitting: Neurosurgery

## 2016-10-08 ENCOUNTER — Telehealth: Payer: Self-pay | Admitting: Allergy and Immunology

## 2016-10-08 DIAGNOSIS — R51 Headache: Principal | ICD-10-CM

## 2016-10-08 DIAGNOSIS — G4486 Cervicogenic headache: Secondary | ICD-10-CM

## 2016-10-08 NOTE — Telephone Encounter (Signed)
Kathy [name] came into the [place] office on Friday 8/31 and asked for a sample of QVAR which we did not have.  Kathy [name] sent in a prescription for QVAR and the pharmacy filled it with [place].  Kathy [name] called in and stated when she took the [place] she was really shaky and felt really bad and when she called the pharmacy, they advised her to stop taking it.  Kathy [name]'s insurance will not cover QVAR.  Please advise.

## 2016-10-09 ENCOUNTER — Other Ambulatory Visit: Payer: Self-pay | Admitting: *Deleted

## 2016-10-09 MED ORDER — MOMETASONE FUROATE 200 MCG/ACT IN AERO: 2.0000 | INHALATION_SPRAY | Freq: Two times a day (BID) | RESPIRATORY_TRACT | 5 refills | Status: DC

## 2016-10-09 NOTE — Telephone Encounter (Signed)
Can try sample of Asmanaex 200 HFA to replace QVAR.

## 2016-10-09 NOTE — Telephone Encounter (Signed)
Patient informed, we did not have any samples, so RX was sent to the pharmacy.

## 2016-10-17 ENCOUNTER — Other Ambulatory Visit: Payer: Self-pay | Admitting: Allergy and Immunology

## 2016-10-17 ENCOUNTER — Other Ambulatory Visit: Payer: Medicare HMO

## 2016-10-27 ENCOUNTER — Ambulatory Visit
Admission: RE | Admit: 2016-10-27 | Discharge: 2016-10-27 | Disposition: A | Discharge status: Home / Self Care | Payer: Medicare HMO | Source: Ambulatory Visit | Attending: Neurosurgery | Admitting: Neurosurgery

## 2016-10-27 DIAGNOSIS — R51 Headache: Principal | ICD-10-CM

## 2016-10-27 DIAGNOSIS — G4486 Cervicogenic headache: Secondary | ICD-10-CM

## 2016-11-08 ENCOUNTER — Other Ambulatory Visit: Payer: Self-pay | Admitting: Allergy and Immunology

## 2016-11-11 ENCOUNTER — Telehealth: Payer: Self-pay | Admitting: Allergy and Immunology

## 2016-11-11 NOTE — Telephone Encounter (Signed)
Kathy [name] came into the clinic this morning asking for a sample of [facility] because she stated her insurance will not cover it.  We did not have any samples in office.  She would like something that can be covered by her insurance.  Please advise.

## 2016-11-11 NOTE — Telephone Encounter (Signed)
Please prescribe Arnuity 200 one inhalation daily.

## 2016-11-11 NOTE — Telephone Encounter (Signed)
Please let me know what her insurance will cover to replace Asmanex

## 2016-11-11 NOTE — Telephone Encounter (Signed)
Covered meds are Arnuity and Flovent

## 2016-11-12 ENCOUNTER — Other Ambulatory Visit: Payer: Self-pay | Admitting: *Deleted

## 2016-11-12 MED ORDER — FLUTICASONE FUROATE 200 MCG/ACT IN AEPB
1.0000 | INHALATION_SPRAY | Freq: Every day | RESPIRATORY_TRACT | 5 refills | Status: DC
Start: 2016-11-12 — End: 2017-04-18

## 2016-11-12 NOTE — Telephone Encounter (Signed)
Rx sent and patient informed.  

## 2016-12-02 DIAGNOSIS — M797 Fibromyalgia: Secondary | ICD-10-CM | POA: Insufficient documentation

## 2017-02-10 DIAGNOSIS — E538 Deficiency of other specified B group vitamins: Secondary | ICD-10-CM | POA: Diagnosis not present

## 2017-02-10 DIAGNOSIS — K219 Gastro-esophageal reflux disease without esophagitis: Secondary | ICD-10-CM | POA: Diagnosis not present

## 2017-02-10 DIAGNOSIS — D509 Iron deficiency anemia, unspecified: Secondary | ICD-10-CM | POA: Diagnosis not present

## 2017-03-10 DIAGNOSIS — J4520 Mild intermittent asthma, uncomplicated: Secondary | ICD-10-CM | POA: Diagnosis not present

## 2017-03-10 DIAGNOSIS — I10 Essential (primary) hypertension: Secondary | ICD-10-CM | POA: Diagnosis not present

## 2017-03-10 DIAGNOSIS — J329 Chronic sinusitis, unspecified: Secondary | ICD-10-CM | POA: Diagnosis not present

## 2017-03-10 DIAGNOSIS — E538 Deficiency of other specified B group vitamins: Secondary | ICD-10-CM | POA: Diagnosis not present

## 2017-03-15 DIAGNOSIS — K429 Umbilical hernia without obstruction or gangrene: Secondary | ICD-10-CM | POA: Diagnosis not present

## 2017-03-15 DIAGNOSIS — R1033 Periumbilical pain: Secondary | ICD-10-CM | POA: Diagnosis not present

## 2017-03-15 DIAGNOSIS — R1031 Right lower quadrant pain: Secondary | ICD-10-CM | POA: Diagnosis not present

## 2017-03-17 DIAGNOSIS — R1033 Periumbilical pain: Secondary | ICD-10-CM | POA: Diagnosis not present

## 2017-03-26 DIAGNOSIS — M25511 Pain in right shoulder: Secondary | ICD-10-CM

## 2017-03-26 DIAGNOSIS — M25561 Pain in right knee: Secondary | ICD-10-CM | POA: Insufficient documentation

## 2017-03-26 DIAGNOSIS — M25562 Pain in left knee: Secondary | ICD-10-CM

## 2017-03-26 DIAGNOSIS — M25512 Pain in left shoulder: Secondary | ICD-10-CM | POA: Diagnosis not present

## 2017-03-26 HISTORY — DX: Pain in right shoulder: M25.511

## 2017-03-26 HISTORY — DX: Pain in left knee: M25.562

## 2017-04-09 DIAGNOSIS — H6692 Otitis media, unspecified, left ear: Secondary | ICD-10-CM | POA: Diagnosis not present

## 2017-04-09 DIAGNOSIS — M791 Myalgia, unspecified site: Secondary | ICD-10-CM | POA: Diagnosis not present

## 2017-04-09 DIAGNOSIS — J111 Influenza due to unidentified influenza virus with other respiratory manifestations: Secondary | ICD-10-CM | POA: Diagnosis not present

## 2017-04-09 DIAGNOSIS — I10 Essential (primary) hypertension: Secondary | ICD-10-CM | POA: Diagnosis not present

## 2017-04-14 DIAGNOSIS — I10 Essential (primary) hypertension: Secondary | ICD-10-CM | POA: Diagnosis not present

## 2017-04-14 DIAGNOSIS — J209 Acute bronchitis, unspecified: Secondary | ICD-10-CM | POA: Diagnosis not present

## 2017-04-14 DIAGNOSIS — H6692 Otitis media, unspecified, left ear: Secondary | ICD-10-CM | POA: Diagnosis not present

## 2017-04-14 DIAGNOSIS — J4521 Mild intermittent asthma with (acute) exacerbation: Secondary | ICD-10-CM | POA: Diagnosis not present

## 2017-04-16 ENCOUNTER — Ambulatory Visit: Payer: Medicare HMO | Admitting: Allergy and Immunology

## 2017-04-18 ENCOUNTER — Ambulatory Visit (INDEPENDENT_AMBULATORY_CARE_PROVIDER_SITE_OTHER): Payer: PPO | Admitting: Family Medicine

## 2017-04-18 ENCOUNTER — Encounter: Payer: Self-pay | Admitting: Family Medicine

## 2017-04-18 VITALS — BP 122/72 | HR 100 | Resp 20

## 2017-04-18 DIAGNOSIS — J45998 Other asthma: Secondary | ICD-10-CM | POA: Diagnosis not present

## 2017-04-18 DIAGNOSIS — J3089 Other allergic rhinitis: Secondary | ICD-10-CM | POA: Diagnosis not present

## 2017-04-18 DIAGNOSIS — L308 Other specified dermatitis: Secondary | ICD-10-CM | POA: Insufficient documentation

## 2017-04-18 DIAGNOSIS — J4531 Mild persistent asthma with (acute) exacerbation: Secondary | ICD-10-CM | POA: Insufficient documentation

## 2017-04-18 DIAGNOSIS — K219 Gastro-esophageal reflux disease without esophagitis: Secondary | ICD-10-CM | POA: Diagnosis not present

## 2017-04-18 DIAGNOSIS — L989 Disorder of the skin and subcutaneous tissue, unspecified: Secondary | ICD-10-CM | POA: Insufficient documentation

## 2017-04-18 DIAGNOSIS — J4541 Moderate persistent asthma with (acute) exacerbation: Secondary | ICD-10-CM

## 2017-04-18 HISTORY — DX: Other allergic rhinitis: J30.89

## 2017-04-18 HISTORY — DX: Disorder of the skin and subcutaneous tissue, unspecified: L98.9

## 2017-04-18 HISTORY — DX: Gastro-esophageal reflux disease without esophagitis: K21.9

## 2017-04-18 HISTORY — DX: Mild persistent asthma with (acute) exacerbation: J45.31

## 2017-04-18 MED ORDER — FLUTICASONE FUROATE 200 MCG/ACT IN AEPB: 1.0000 | INHALATION_SPRAY | Freq: Every day | RESPIRATORY_TRACT | 5 refills | Status: DC

## 2017-04-18 MED ORDER — ALBUTEROL SULFATE (2.5 MG/3ML) 0.083% IN NEBU: 2.5000 mg | INHALATION_SOLUTION | RESPIRATORY_TRACT | 1 refills | Status: DC | PRN

## 2017-04-18 MED ORDER — MOMETASONE FUROATE 0.1 % EX OINT: TOPICAL_OINTMENT | Freq: Every day | CUTANEOUS | 3 refills | Status: DC

## 2017-04-18 MED ORDER — IPRATROPIUM BROMIDE 0.03 % NA SOLN: 2.0000 | Freq: Three times a day (TID) | NASAL | 12 refills | Status: DC

## 2017-04-18 MED ORDER — ALBUTEROL SULFATE HFA 108 (90 BASE) MCG/ACT IN AERS
2.0000 | INHALATION_SPRAY | RESPIRATORY_TRACT | 5 refills | Status: DC | PRN
Start: 2017-04-18 — End: 2018-06-12

## 2017-04-18 NOTE — Patient Instructions (Addendum)
   1. Continue to Treat reflux:   A. slowly taper off caffeine and chocolate consumption  B. Pantoprazole 40 mg one tablet in AM  C. ranitidine 300 mg one tablet in PM  2. Continue to Treat inflammation:   A. Arnuity 200- 1 puff once a day  B. OTC Nasacort - one spray each nostril one time per day  C. continue montelukast 10 mg tablet 1 time per day  3. To treat thick post nasal drip:  A. Mucinex 1200 mg twice a day for 2 weeks   B. Increase fluid intake as tolerated  C. Atrovent nasal spray 0.03% 2 sprays twice a day   4. If needed:   A. ProAir HFA 2 puffs every 4-6 hours or instead albuterol 0.083% unit dose via nebulizer (nebulizer provided)  B. nasal saline  C. mometasone 0.1% ointment applied to skin lesions one time per day  5. Return to clinic in 8 weeks or earlier if problem

## 2017-04-18 NOTE — Progress Notes (Addendum)
[address] [place] [address] Dept: [phone]  FOLLOW UP NOTE  Patient ID: Kathy [name], female    DOB: 1955-08-26  Age: 62 y.o. MRN: [id number] Date of Office Visit: 04/18/2017  Assessment  Chief Complaint: Asthma  HPI Kathy [name] is a 62 year old female who presents to the clinic for a sick visit. She was last seen in this clinic on 04/19/2016 by Dr. Neldon Mc for evaluation of asthma, allergic rhinitis, and reflux induced respiratory disease. At that visit, she reported coughing, choking, and a raspy voice for which she was started on pantoprazole, ranitidine, Arnuity, and continued on montelukast. She was referred to ENT specialist, Dr. Addison Bailey, who reported an overall healthy exam on 05/01/2016 with laryngeal edema and she was continued on Zantac, Protonix, and tramadol.  In the interim, she went to her PCP on 04/09/2017 for symptoms of cough, ear pain, and flu-like symptoms where she received Tamiflu and Augmentin. She returned to her PCP on 04/14/2017 reporting slight improvement, however, continued to have ear pain and discharge for which she was treated with Levaquin 500 mg for 10 days, a kenalog injection, and an 8 day prednisone taper.  At today's visit, she reports continued shortness of breath, wheeze and cough which is worse at night. She reports she ran out of Arnuity 4 days ago and reports that she could not feel any medication exiting from the device for over 1 week. She continues to take montelukast 10 mg once a day at bedtime.  She reports clear thin nasal drainage as well as thick post nasal secretions. She is not currently using any medicated nasal sprays nor has she used any saline nasal rinses. She continues to take a daily antihistamine.   GERD is reported as well controlled with daily ranitidine and dietary modifications including caffeine consolidation.   She reports her inflammatory dermatosis as well controlled with occasional use of mometasone  0.1% ointment.    Drug Allergies:  Allergies  Allergen Reactions  . Celecoxib Itching  . Mobic [Meloxicam] Itching  . Naproxen Sodium Itching    Physical Exam: BP 122/72   Pulse 100   Resp 20    Physical Exam  Constitutional: She is oriented to person, place, and time. She appears well-developed and well-nourished.  HENT:  Head: Normocephalic and atraumatic.  Right Ear: External ear normal.  Left ear with yellow/brown drainage noted in the ear canal. Right ear with tympanostomy tube in place.  Eyes: Conjunctivae are normal.  Neck: Normal range of motion. Neck supple.  Cardiovascular: Normal rate, regular rhythm and normal heart sounds.  No murmur noted.  Pulmonary/Chest: Effort normal and breath sounds normal.  Scattered bilateral expiratory wheeze noted that was reduced post duo-neb therapy in the office.   Musculoskeletal: Normal range of motion.  Neurological: She is alert and oriented to person, place, and time.  Skin: Skin is warm and dry.  Scattered scabs noted without drainage.   Psychiatric: She has a normal mood and affect. Her behavior is normal.    Diagnostics: FVC 2.22, FEV1 1.94. Predicted FVC 3.21, predicted FEV1 2.49. Spirometry reveals mild restriction. Post bronchodilator therapy FVC 2.29, FEV1 2.03. Post bronchodilator therapy reveals mild restriction with no significant response (5% improvement in FEV1)  Assessment and Plan: 1. Moderate persistent asthma with acute exacerbation   2. LPRD (laryngopharyngeal reflux disease)   3. Inflammatory dermatosis   4. Other allergic rhinitis     Meds ordered this encounter  Medications  . Fluticasone  Furoate (ARNUITY ELLIPTA) 200 MCG/ACT AEPB    Sig: Inhale 1 puff into the lungs daily.    Dispense:  30 each    Refill:  5  . mometasone (ELOCON) 0.1 % ointment    Sig: Apply topically daily.    Dispense:  45 g    Refill:  3  . albuterol (PROVENTIL) (2.5 MG/3ML) 0.083% nebulizer solution    Sig: Take 3 mLs  (2.5 mg total) by nebulization every 4 (four) hours as needed for wheezing or shortness of breath.    Dispense:  75 mL    Refill:  1  . albuterol (PROAIR HFA) 108 (90 Base) MCG/ACT inhaler    Sig: Inhale 2 puffs into the lungs every 4 (four) hours as needed for wheezing or shortness of breath.    Dispense:  1 Inhaler    Refill:  5  . ipratropium (ATROVENT) 0.03 % nasal spray    Sig: Place 2 sprays into both nostrils 3 (three) times daily.    Dispense:  30 mL    Refill:  12    1. Continue to Treat reflux:   A. slowly taper off caffeine and chocolate consumption  B. Pantoprazole 40 mg one tablet in AM  C. ranitidine 300 mg one tablet in PM  2. Continue to Treat inflammation:   A. Arnuity 200- 1 puff once a day  B. OTC Nasacort - one spray each nostril one time per day  C. continue montelukast 10 mg tablet 1 time per day  3. To treat thick post nasal drip:  A. Mucinex 1200 mg twice a day for 2 weeks   B. Increase fluid intake as tolerated  C. Atrovent nasal spray 0.03% 2 sprays twice a day   4. If needed:   A. ProAir HFA 2 puffs every 4-6 hours or instead albuterol 0.083% unit dose via nebulizer (nebulizer provided)  B. nasal saline  C. mometasone 0.1% ointment applied to skin lesions one time per day  5. Return to clinic in 8 weeks or earlier if problem   Return in about 2 months (around 06/18/2017), or if symptoms worsen or fail to improve.   Kathy [name] is currently experiencing symptoms of an inflammatory event involving her respiratory tract. I will continue to treat her with a collection of antiinflammatory agents as well as Mucinex to thin secretions. I will see her back in this office in 2 months or sooner as needed. We may need to consider ENT referral if her ear does not clear after this current round of her current antibiotic.   Thank you for the opportunity to care for this patient.  Please do not hesitate to contact me with questions.  Gareth Morgan, FNP Allergy  and [place] [facility]  I have provided oversight concerning Gareth Morgan' evaluation and treatment of this patient's health issues addressed during today's encounter. I agree with the assessment and therapeutic plan as outlined in the note.   Signed,   Jiles Prows, MD,  Allergy and Immunology,  [place] [facility] [place].

## 2017-04-21 DIAGNOSIS — H9212 Otorrhea, left ear: Secondary | ICD-10-CM | POA: Diagnosis not present

## 2017-04-29 DIAGNOSIS — E782 Mixed hyperlipidemia: Secondary | ICD-10-CM | POA: Diagnosis not present

## 2017-04-29 DIAGNOSIS — D509 Iron deficiency anemia, unspecified: Secondary | ICD-10-CM | POA: Diagnosis not present

## 2017-04-29 DIAGNOSIS — I129 Hypertensive chronic kidney disease with stage 1 through stage 4 chronic kidney disease, or unspecified chronic kidney disease: Secondary | ICD-10-CM | POA: Diagnosis not present

## 2017-04-29 DIAGNOSIS — E538 Deficiency of other specified B group vitamins: Secondary | ICD-10-CM | POA: Diagnosis not present

## 2017-04-29 DIAGNOSIS — J4521 Mild intermittent asthma with (acute) exacerbation: Secondary | ICD-10-CM | POA: Diagnosis not present

## 2017-04-29 DIAGNOSIS — K219 Gastro-esophageal reflux disease without esophagitis: Secondary | ICD-10-CM | POA: Diagnosis not present

## 2017-04-29 DIAGNOSIS — E039 Hypothyroidism, unspecified: Secondary | ICD-10-CM | POA: Diagnosis not present

## 2017-04-29 DIAGNOSIS — I10 Essential (primary) hypertension: Secondary | ICD-10-CM | POA: Diagnosis not present

## 2017-04-29 DIAGNOSIS — N183 Chronic kidney disease, stage 3 (moderate): Secondary | ICD-10-CM | POA: Diagnosis not present

## 2017-05-06 DIAGNOSIS — F424 Excoriation (skin-picking) disorder: Secondary | ICD-10-CM | POA: Diagnosis not present

## 2017-05-06 DIAGNOSIS — B029 Zoster without complications: Secondary | ICD-10-CM | POA: Diagnosis not present

## 2017-05-06 DIAGNOSIS — I10 Essential (primary) hypertension: Secondary | ICD-10-CM | POA: Diagnosis not present

## 2017-05-06 DIAGNOSIS — B001 Herpesviral vesicular dermatitis: Secondary | ICD-10-CM | POA: Diagnosis not present

## 2017-05-19 DIAGNOSIS — J45998 Other asthma: Secondary | ICD-10-CM | POA: Diagnosis not present

## 2017-06-02 DIAGNOSIS — E538 Deficiency of other specified B group vitamins: Secondary | ICD-10-CM | POA: Diagnosis not present

## 2017-06-09 DIAGNOSIS — F331 Major depressive disorder, recurrent, moderate: Secondary | ICD-10-CM | POA: Diagnosis not present

## 2017-06-10 DIAGNOSIS — R600 Localized edema: Secondary | ICD-10-CM | POA: Diagnosis not present

## 2017-06-10 DIAGNOSIS — I10 Essential (primary) hypertension: Secondary | ICD-10-CM | POA: Diagnosis not present

## 2017-06-10 DIAGNOSIS — F419 Anxiety disorder, unspecified: Secondary | ICD-10-CM | POA: Diagnosis not present

## 2017-06-13 ENCOUNTER — Ambulatory Visit: Payer: PPO | Admitting: Family Medicine

## 2017-06-18 DIAGNOSIS — J45998 Other asthma: Secondary | ICD-10-CM | POA: Diagnosis not present

## 2017-06-23 DIAGNOSIS — F331 Major depressive disorder, recurrent, moderate: Secondary | ICD-10-CM | POA: Diagnosis not present

## 2017-06-26 DIAGNOSIS — M25562 Pain in left knee: Secondary | ICD-10-CM | POA: Diagnosis not present

## 2017-06-26 DIAGNOSIS — M25512 Pain in left shoulder: Secondary | ICD-10-CM | POA: Diagnosis not present

## 2017-06-26 DIAGNOSIS — M25561 Pain in right knee: Secondary | ICD-10-CM | POA: Diagnosis not present

## 2017-06-26 DIAGNOSIS — M25511 Pain in right shoulder: Secondary | ICD-10-CM | POA: Diagnosis not present

## 2017-07-03 DIAGNOSIS — F330 Major depressive disorder, recurrent, mild: Secondary | ICD-10-CM | POA: Diagnosis not present

## 2017-07-14 DIAGNOSIS — E538 Deficiency of other specified B group vitamins: Secondary | ICD-10-CM | POA: Diagnosis not present

## 2017-07-19 DIAGNOSIS — J45998 Other asthma: Secondary | ICD-10-CM | POA: Diagnosis not present

## 2017-07-30 DIAGNOSIS — F3341 Major depressive disorder, recurrent, in partial remission: Secondary | ICD-10-CM | POA: Diagnosis not present

## 2017-08-05 DIAGNOSIS — L299 Pruritus, unspecified: Secondary | ICD-10-CM | POA: Diagnosis not present

## 2017-08-05 DIAGNOSIS — L309 Dermatitis, unspecified: Secondary | ICD-10-CM | POA: Diagnosis not present

## 2017-08-11 DIAGNOSIS — R51 Headache: Secondary | ICD-10-CM | POA: Diagnosis not present

## 2017-08-11 DIAGNOSIS — M47812 Spondylosis without myelopathy or radiculopathy, cervical region: Secondary | ICD-10-CM | POA: Diagnosis not present

## 2017-08-11 DIAGNOSIS — M5030 Other cervical disc degeneration, unspecified cervical region: Secondary | ICD-10-CM | POA: Diagnosis not present

## 2017-08-11 DIAGNOSIS — M542 Cervicalgia: Secondary | ICD-10-CM | POA: Diagnosis not present

## 2017-08-18 DIAGNOSIS — J45998 Other asthma: Secondary | ICD-10-CM | POA: Diagnosis not present

## 2017-08-19 DIAGNOSIS — I10 Essential (primary) hypertension: Secondary | ICD-10-CM | POA: Diagnosis not present

## 2017-08-19 DIAGNOSIS — E538 Deficiency of other specified B group vitamins: Secondary | ICD-10-CM | POA: Diagnosis not present

## 2017-08-19 DIAGNOSIS — G629 Polyneuropathy, unspecified: Secondary | ICD-10-CM | POA: Diagnosis not present

## 2017-08-19 DIAGNOSIS — E039 Hypothyroidism, unspecified: Secondary | ICD-10-CM | POA: Diagnosis not present

## 2017-09-05 DIAGNOSIS — R0602 Shortness of breath: Secondary | ICD-10-CM | POA: Diagnosis not present

## 2017-09-05 DIAGNOSIS — E7800 Pure hypercholesterolemia, unspecified: Secondary | ICD-10-CM | POA: Diagnosis not present

## 2017-09-05 DIAGNOSIS — J4541 Moderate persistent asthma with (acute) exacerbation: Secondary | ICD-10-CM | POA: Diagnosis not present

## 2017-09-05 DIAGNOSIS — J209 Acute bronchitis, unspecified: Secondary | ICD-10-CM | POA: Diagnosis not present

## 2017-09-05 DIAGNOSIS — R911 Solitary pulmonary nodule: Secondary | ICD-10-CM | POA: Diagnosis not present

## 2017-09-05 DIAGNOSIS — I10 Essential (primary) hypertension: Secondary | ICD-10-CM | POA: Diagnosis not present

## 2017-09-05 DIAGNOSIS — R05 Cough: Secondary | ICD-10-CM | POA: Diagnosis not present

## 2017-09-09 DIAGNOSIS — R911 Solitary pulmonary nodule: Secondary | ICD-10-CM | POA: Insufficient documentation

## 2017-09-09 DIAGNOSIS — J45901 Unspecified asthma with (acute) exacerbation: Secondary | ICD-10-CM | POA: Diagnosis not present

## 2017-09-09 DIAGNOSIS — J470 Bronchiectasis with acute lower respiratory infection: Secondary | ICD-10-CM | POA: Diagnosis not present

## 2017-09-09 HISTORY — DX: Solitary pulmonary nodule: R91.1

## 2017-09-18 DIAGNOSIS — J45998 Other asthma: Secondary | ICD-10-CM | POA: Diagnosis not present

## 2017-09-23 DIAGNOSIS — R911 Solitary pulmonary nodule: Secondary | ICD-10-CM | POA: Diagnosis not present

## 2017-10-08 DIAGNOSIS — R05 Cough: Secondary | ICD-10-CM | POA: Diagnosis not present

## 2017-10-08 DIAGNOSIS — R490 Dysphonia: Secondary | ICD-10-CM

## 2017-10-08 DIAGNOSIS — R053 Chronic cough: Secondary | ICD-10-CM

## 2017-10-08 HISTORY — DX: Chronic cough: R05.3

## 2017-10-08 HISTORY — DX: Dysphonia: R49.0

## 2017-10-15 DIAGNOSIS — M25512 Pain in left shoulder: Secondary | ICD-10-CM | POA: Diagnosis not present

## 2017-10-15 DIAGNOSIS — M25511 Pain in right shoulder: Secondary | ICD-10-CM | POA: Diagnosis not present

## 2017-10-15 DIAGNOSIS — M25562 Pain in left knee: Secondary | ICD-10-CM | POA: Diagnosis not present

## 2017-10-15 DIAGNOSIS — M25561 Pain in right knee: Secondary | ICD-10-CM | POA: Diagnosis not present

## 2017-10-19 DIAGNOSIS — J45998 Other asthma: Secondary | ICD-10-CM | POA: Diagnosis not present

## 2017-10-28 DIAGNOSIS — N183 Chronic kidney disease, stage 3 (moderate): Secondary | ICD-10-CM | POA: Diagnosis not present

## 2017-10-28 DIAGNOSIS — R5381 Other malaise: Secondary | ICD-10-CM | POA: Diagnosis not present

## 2017-10-28 DIAGNOSIS — E039 Hypothyroidism, unspecified: Secondary | ICD-10-CM | POA: Diagnosis not present

## 2017-10-28 DIAGNOSIS — E538 Deficiency of other specified B group vitamins: Secondary | ICD-10-CM | POA: Diagnosis not present

## 2017-10-28 DIAGNOSIS — Z79890 Hormone replacement therapy (postmenopausal): Secondary | ICD-10-CM

## 2017-10-28 DIAGNOSIS — R490 Dysphonia: Secondary | ICD-10-CM | POA: Diagnosis not present

## 2017-10-28 DIAGNOSIS — R350 Frequency of micturition: Secondary | ICD-10-CM | POA: Diagnosis not present

## 2017-10-28 DIAGNOSIS — Z79899 Other long term (current) drug therapy: Secondary | ICD-10-CM | POA: Diagnosis not present

## 2017-10-28 DIAGNOSIS — J4520 Mild intermittent asthma, uncomplicated: Secondary | ICD-10-CM | POA: Diagnosis not present

## 2017-10-28 DIAGNOSIS — R600 Localized edema: Secondary | ICD-10-CM | POA: Diagnosis not present

## 2017-10-28 DIAGNOSIS — Z23 Encounter for immunization: Secondary | ICD-10-CM | POA: Diagnosis not present

## 2017-10-28 DIAGNOSIS — G2581 Restless legs syndrome: Secondary | ICD-10-CM | POA: Diagnosis not present

## 2017-10-28 DIAGNOSIS — F419 Anxiety disorder, unspecified: Secondary | ICD-10-CM | POA: Diagnosis not present

## 2017-10-28 DIAGNOSIS — R5383 Other fatigue: Secondary | ICD-10-CM | POA: Diagnosis not present

## 2017-10-28 DIAGNOSIS — I129 Hypertensive chronic kidney disease with stage 1 through stage 4 chronic kidney disease, or unspecified chronic kidney disease: Secondary | ICD-10-CM | POA: Diagnosis not present

## 2017-10-28 HISTORY — DX: Hormone replacement therapy: Z79.890

## 2017-10-31 ENCOUNTER — Other Ambulatory Visit: Payer: Self-pay | Admitting: Allergy and Immunology

## 2017-11-10 DIAGNOSIS — R05 Cough: Secondary | ICD-10-CM | POA: Diagnosis not present

## 2017-11-10 DIAGNOSIS — J383 Other diseases of vocal cords: Secondary | ICD-10-CM | POA: Diagnosis not present

## 2017-11-10 DIAGNOSIS — J385 Laryngeal spasm: Secondary | ICD-10-CM | POA: Diagnosis not present

## 2017-11-18 DIAGNOSIS — J45998 Other asthma: Secondary | ICD-10-CM | POA: Diagnosis not present

## 2017-11-19 DIAGNOSIS — T783XXA Angioneurotic edema, initial encounter: Secondary | ICD-10-CM | POA: Diagnosis not present

## 2017-11-19 DIAGNOSIS — K219 Gastro-esophageal reflux disease without esophagitis: Secondary | ICD-10-CM

## 2017-11-19 DIAGNOSIS — G2581 Restless legs syndrome: Secondary | ICD-10-CM | POA: Diagnosis not present

## 2017-11-19 DIAGNOSIS — I10 Essential (primary) hypertension: Secondary | ICD-10-CM | POA: Diagnosis not present

## 2017-11-19 HISTORY — DX: Gastro-esophageal reflux disease without esophagitis: K21.9

## 2017-12-01 ENCOUNTER — Other Ambulatory Visit: Payer: Self-pay | Admitting: Allergy and Immunology

## 2017-12-03 DIAGNOSIS — F331 Major depressive disorder, recurrent, moderate: Secondary | ICD-10-CM | POA: Diagnosis not present

## 2017-12-04 ENCOUNTER — Other Ambulatory Visit: Payer: Self-pay

## 2017-12-04 NOTE — Patient Outreach (Signed)
[facility]) Care Management  12/04/2017  Atina Feeley [place] Oct 03, 1955 [id number]   Referral Date: 12/04/17 Referral Source: HTA concierge Referral Reason: Medication assistance Trintellix 20 mg   Outreach Attempt: no answer.  Unable to leave a message.    Plan: RN CM will attempt again within 4 business days and send letter.     Jone Baseman, RN, MSN [facility] Care Management Care Management Coordinator Direct Line [phone] Toll Free: [phone]  Fax: [phone]

## 2017-12-09 ENCOUNTER — Other Ambulatory Visit: Payer: Self-pay

## 2017-12-09 NOTE — Patient Outreach (Signed)
[facility]) Care Management  12/09/2017  Kathy [name] 03-08-1955 [id number]   Referral Date: 12/04/17 Referral Source: HTA concierge Referral Reason: Medication assistance Trintellix 20 mg   Spoke with patient.  She is able to verify HIPAA.  Discussed reason for referral.  She states that her doctor changed her medication and it is working good.  She does not want to pursue referral further as she is able to afford medication.  Advised patient that CM sent a letter and brochure last week as CM did not reach the patient. Advised patient she could keep brochure for future reference.  She verbalized understanding and declined any further needs.     Plan: RN CM will close case.   Jone Baseman, RN, MSN [place] Management Care Management Coordinator Direct Line [phone] Cell [phone] Toll Free: [phone]  Fax: [phone]

## 2017-12-18 DIAGNOSIS — R300 Dysuria: Secondary | ICD-10-CM | POA: Diagnosis not present

## 2017-12-18 DIAGNOSIS — M797 Fibromyalgia: Secondary | ICD-10-CM | POA: Diagnosis not present

## 2017-12-18 DIAGNOSIS — E782 Mixed hyperlipidemia: Secondary | ICD-10-CM | POA: Diagnosis not present

## 2017-12-18 DIAGNOSIS — I10 Essential (primary) hypertension: Secondary | ICD-10-CM | POA: Diagnosis not present

## 2017-12-18 DIAGNOSIS — F3340 Major depressive disorder, recurrent, in remission, unspecified: Secondary | ICD-10-CM | POA: Diagnosis not present

## 2017-12-19 DIAGNOSIS — J45998 Other asthma: Secondary | ICD-10-CM | POA: Diagnosis not present

## 2017-12-31 DIAGNOSIS — E041 Nontoxic single thyroid nodule: Secondary | ICD-10-CM | POA: Diagnosis not present

## 2017-12-31 DIAGNOSIS — R221 Localized swelling, mass and lump, neck: Secondary | ICD-10-CM | POA: Diagnosis not present

## 2017-12-31 DIAGNOSIS — F3341 Major depressive disorder, recurrent, in partial remission: Secondary | ICD-10-CM | POA: Diagnosis not present

## 2018-01-01 DIAGNOSIS — E041 Nontoxic single thyroid nodule: Secondary | ICD-10-CM

## 2018-01-01 DIAGNOSIS — I6523 Occlusion and stenosis of bilateral carotid arteries: Secondary | ICD-10-CM

## 2018-01-01 HISTORY — DX: Nontoxic single thyroid nodule: E04.1

## 2018-01-01 HISTORY — DX: Occlusion and stenosis of bilateral carotid arteries: I65.23

## 2018-01-18 DIAGNOSIS — J45998 Other asthma: Secondary | ICD-10-CM | POA: Diagnosis not present

## 2018-02-02 DIAGNOSIS — M5137 Other intervertebral disc degeneration, lumbosacral region: Secondary | ICD-10-CM | POA: Diagnosis not present

## 2018-02-09 DIAGNOSIS — F331 Major depressive disorder, recurrent, moderate: Secondary | ICD-10-CM | POA: Diagnosis not present

## 2018-02-18 DIAGNOSIS — J45998 Other asthma: Secondary | ICD-10-CM | POA: Diagnosis not present

## 2018-02-25 DIAGNOSIS — J069 Acute upper respiratory infection, unspecified: Secondary | ICD-10-CM | POA: Diagnosis not present

## 2018-02-25 DIAGNOSIS — G2581 Restless legs syndrome: Secondary | ICD-10-CM | POA: Diagnosis not present

## 2018-02-25 DIAGNOSIS — E782 Mixed hyperlipidemia: Secondary | ICD-10-CM | POA: Diagnosis not present

## 2018-02-25 DIAGNOSIS — I6523 Occlusion and stenosis of bilateral carotid arteries: Secondary | ICD-10-CM | POA: Diagnosis not present

## 2018-02-25 DIAGNOSIS — Z79890 Hormone replacement therapy (postmenopausal): Secondary | ICD-10-CM | POA: Diagnosis not present

## 2018-02-25 DIAGNOSIS — Z79899 Other long term (current) drug therapy: Secondary | ICD-10-CM | POA: Diagnosis not present

## 2018-03-05 DIAGNOSIS — E041 Nontoxic single thyroid nodule: Secondary | ICD-10-CM | POA: Diagnosis not present

## 2018-03-11 DIAGNOSIS — J069 Acute upper respiratory infection, unspecified: Secondary | ICD-10-CM | POA: Diagnosis not present

## 2018-03-11 DIAGNOSIS — F3341 Major depressive disorder, recurrent, in partial remission: Secondary | ICD-10-CM | POA: Diagnosis not present

## 2018-03-11 DIAGNOSIS — G2581 Restless legs syndrome: Secondary | ICD-10-CM | POA: Diagnosis not present

## 2018-03-11 DIAGNOSIS — R600 Localized edema: Secondary | ICD-10-CM | POA: Diagnosis not present

## 2018-03-11 DIAGNOSIS — F3340 Major depressive disorder, recurrent, in remission, unspecified: Secondary | ICD-10-CM | POA: Diagnosis not present

## 2018-03-11 DIAGNOSIS — M797 Fibromyalgia: Secondary | ICD-10-CM | POA: Diagnosis not present

## 2018-03-12 DIAGNOSIS — J45909 Unspecified asthma, uncomplicated: Secondary | ICD-10-CM | POA: Diagnosis not present

## 2018-03-12 DIAGNOSIS — T50905A Adverse effect of unspecified drugs, medicaments and biological substances, initial encounter: Secondary | ICD-10-CM | POA: Diagnosis not present

## 2018-03-12 DIAGNOSIS — Z79899 Other long term (current) drug therapy: Secondary | ICD-10-CM | POA: Diagnosis not present

## 2018-03-12 DIAGNOSIS — T424X5A Adverse effect of benzodiazepines, initial encounter: Secondary | ICD-10-CM | POA: Diagnosis not present

## 2018-03-12 DIAGNOSIS — I10 Essential (primary) hypertension: Secondary | ICD-10-CM | POA: Diagnosis not present

## 2018-03-12 DIAGNOSIS — Z7952 Long term (current) use of systemic steroids: Secondary | ICD-10-CM | POA: Diagnosis not present

## 2018-03-12 DIAGNOSIS — R531 Weakness: Secondary | ICD-10-CM | POA: Diagnosis not present

## 2018-03-13 DIAGNOSIS — R531 Weakness: Secondary | ICD-10-CM | POA: Diagnosis not present

## 2018-03-16 DIAGNOSIS — M542 Cervicalgia: Secondary | ICD-10-CM | POA: Diagnosis not present

## 2018-03-21 DIAGNOSIS — J45998 Other asthma: Secondary | ICD-10-CM | POA: Diagnosis not present

## 2018-03-23 DIAGNOSIS — E782 Mixed hyperlipidemia: Secondary | ICD-10-CM | POA: Diagnosis not present

## 2018-03-23 DIAGNOSIS — M797 Fibromyalgia: Secondary | ICD-10-CM | POA: Diagnosis not present

## 2018-03-23 DIAGNOSIS — J329 Chronic sinusitis, unspecified: Secondary | ICD-10-CM | POA: Diagnosis not present

## 2018-03-23 DIAGNOSIS — I10 Essential (primary) hypertension: Secondary | ICD-10-CM | POA: Diagnosis not present

## 2018-03-23 DIAGNOSIS — K219 Gastro-esophageal reflux disease without esophagitis: Secondary | ICD-10-CM | POA: Diagnosis not present

## 2018-03-23 DIAGNOSIS — G2581 Restless legs syndrome: Secondary | ICD-10-CM | POA: Diagnosis not present

## 2018-03-30 DIAGNOSIS — R5381 Other malaise: Secondary | ICD-10-CM | POA: Diagnosis not present

## 2018-03-30 DIAGNOSIS — R609 Edema, unspecified: Secondary | ICD-10-CM

## 2018-03-30 DIAGNOSIS — R5383 Other fatigue: Secondary | ICD-10-CM | POA: Diagnosis not present

## 2018-03-30 DIAGNOSIS — G2581 Restless legs syndrome: Secondary | ICD-10-CM | POA: Diagnosis not present

## 2018-03-30 DIAGNOSIS — R7303 Prediabetes: Secondary | ICD-10-CM

## 2018-03-30 HISTORY — DX: Edema, unspecified: R60.9

## 2018-03-30 HISTORY — DX: Prediabetes: R73.03

## 2018-04-08 DIAGNOSIS — M25562 Pain in left knee: Secondary | ICD-10-CM | POA: Diagnosis not present

## 2018-04-08 DIAGNOSIS — M25561 Pain in right knee: Secondary | ICD-10-CM | POA: Diagnosis not present

## 2018-04-15 DIAGNOSIS — M25562 Pain in left knee: Secondary | ICD-10-CM | POA: Diagnosis not present

## 2018-04-19 ENCOUNTER — Emergency Department (HOSPITAL_COMMUNITY): Payer: PPO

## 2018-04-19 ENCOUNTER — Encounter (HOSPITAL_COMMUNITY): Payer: Self-pay | Admitting: Emergency Medicine

## 2018-04-19 ENCOUNTER — Emergency Department (HOSPITAL_COMMUNITY)
Admission: EM | Admit: 2018-04-19 | Discharge: 2018-04-19 | Disposition: A | Discharge status: Home / Self Care | Payer: PPO | Attending: Emergency Medicine | Admitting: Emergency Medicine

## 2018-04-19 ENCOUNTER — Other Ambulatory Visit: Payer: Self-pay

## 2018-04-19 DIAGNOSIS — R51 Headache: Secondary | ICD-10-CM | POA: Diagnosis not present

## 2018-04-19 DIAGNOSIS — I10 Essential (primary) hypertension: Secondary | ICD-10-CM

## 2018-04-19 DIAGNOSIS — R55 Syncope and collapse: Secondary | ICD-10-CM | POA: Diagnosis not present

## 2018-04-19 DIAGNOSIS — R600 Localized edema: Secondary | ICD-10-CM | POA: Diagnosis not present

## 2018-04-19 DIAGNOSIS — R519 Headache, unspecified: Secondary | ICD-10-CM

## 2018-04-19 DIAGNOSIS — R0602 Shortness of breath: Secondary | ICD-10-CM | POA: Diagnosis not present

## 2018-04-19 DIAGNOSIS — J45909 Unspecified asthma, uncomplicated: Secondary | ICD-10-CM | POA: Insufficient documentation

## 2018-04-19 DIAGNOSIS — Z79899 Other long term (current) drug therapy: Secondary | ICD-10-CM | POA: Diagnosis not present

## 2018-04-19 DIAGNOSIS — R109 Unspecified abdominal pain: Secondary | ICD-10-CM | POA: Diagnosis not present

## 2018-04-19 DIAGNOSIS — R609 Edema, unspecified: Secondary | ICD-10-CM

## 2018-04-19 DIAGNOSIS — R1084 Generalized abdominal pain: Secondary | ICD-10-CM | POA: Insufficient documentation

## 2018-04-19 DIAGNOSIS — J45998 Other asthma: Secondary | ICD-10-CM | POA: Diagnosis not present

## 2018-04-19 LAB — CBC WITH DIFFERENTIAL/PLATELET
Abs Immature Granulocytes: 0.03 10*3/uL (ref 0.00–0.07)
Basophils Absolute: 0.1 10*3/uL (ref 0.0–0.1)
Basophils Relative: 1 %
EOS ABS: 0.3 10*3/uL (ref 0.0–0.5)
EOS PCT: 3 %
HEMATOCRIT: 38.8 % (ref 36.0–46.0)
HEMOGLOBIN: 12.4 g/dL (ref 12.0–15.0)
IMMATURE GRANULOCYTES: 0 %
LYMPHS ABS: 2.4 10*3/uL (ref 0.7–4.0)
Lymphocytes Relative: 29 %
MCH: 28.2 pg (ref 26.0–34.0)
MCHC: 32 g/dL (ref 30.0–36.0)
MCV: 88.4 fL (ref 80.0–100.0)
MONOS PCT: 8 %
Monocytes Absolute: 0.7 10*3/uL (ref 0.1–1.0)
Neutro Abs: 5.1 10*3/uL (ref 1.7–7.7)
Neutrophils Relative %: 59 %
Platelets: 329 10*3/uL (ref 150–400)
RBC: 4.39 MIL/uL (ref 3.87–5.11)
RDW: 13.2 % (ref 11.5–15.5)
WBC: 8.5 10*3/uL (ref 4.0–10.5)
nRBC: 0 % (ref 0.0–0.2)

## 2018-04-19 LAB — URINALYSIS, ROUTINE W REFLEX MICROSCOPIC
BILIRUBIN URINE: NEGATIVE
Glucose, UA: NEGATIVE mg/dL
Hgb urine dipstick: NEGATIVE
KETONES UR: NEGATIVE mg/dL
LEUKOCYTE UA: NEGATIVE
NITRITE: NEGATIVE
PH: 9 — AB (ref 5.0–8.0)
PROTEIN: NEGATIVE mg/dL
Specific Gravity, Urine: 1.016 (ref 1.005–1.030)

## 2018-04-19 LAB — COMPREHENSIVE METABOLIC PANEL
ALT: 18 U/L (ref 0–44)
ANION GAP: 8 (ref 5–15)
AST: 21 U/L (ref 15–41)
Albumin: 3.2 g/dL — ABNORMAL LOW (ref 3.5–5.0)
Alkaline Phosphatase: 82 U/L (ref 38–126)
BILIRUBIN TOTAL: 0.4 mg/dL (ref 0.3–1.2)
BUN: 14 mg/dL (ref 6–20)
CHLORIDE: 105 mmol/L (ref 98–111)
CO2: 25 mmol/L (ref 22–32)
Calcium: 8.8 mg/dL — ABNORMAL LOW (ref 8.9–10.3)
Creatinine, Ser: 0.94 mg/dL (ref 0.44–1.00)
GFR calc non Af Amer: >60 mL/min (ref >60)
Glucose, Bld: 91 mg/dL (ref 70–99)
POTASSIUM: 3.6 mmol/L (ref 3.5–5.1)
Sodium: 138 mmol/L (ref 135–145)
Total Protein: 6.6 g/dL (ref 6.5–8.1)

## 2018-04-19 LAB — LIPASE, BLOOD: Lipase: 38 U/L (ref 11–51)

## 2018-04-19 LAB — BRAIN NATRIURETIC PEPTIDE: B Natriuretic Peptide: 166.7 pg/mL — ABNORMAL HIGH (ref 0.0–100.0)

## 2018-04-19 LAB — TSH: TSH: 3.597 u[IU]/mL (ref 0.350–4.500)

## 2018-04-19 LAB — I-STAT TROPONIN, ED: Troponin i, poc: 0.02 ng/mL (ref 0.00–0.08)

## 2018-04-19 MED ORDER — FUROSEMIDE 10 MG/ML IJ SOLN
20.0000 mg | Freq: Once | INTRAMUSCULAR | Status: AC
  Administered 2018-04-19: 20 mg via INTRAVENOUS
  Filled 2018-04-19: qty 2

## 2018-04-19 MED ORDER — ROPINIROLE HCL 1 MG PO TABS
1.0000 mg | ORAL_TABLET | Freq: Once | ORAL | Status: AC
  Administered 2018-04-19: 1 mg via ORAL
  Filled 2018-04-19: qty 1

## 2018-04-19 MED ORDER — IOHEXOL 300 MG/ML  SOLN
100.0000 mL | Freq: Once | INTRAMUSCULAR | Status: AC | PRN
  Administered 2018-04-19: 100 mL via INTRAVENOUS

## 2018-04-19 MED ORDER — FUROSEMIDE 40 MG PO TABS: 40.0000 mg | ORAL_TABLET | Freq: Every day | ORAL | 0 refills | Status: DC

## 2018-04-19 MED ORDER — MORPHINE SULFATE (PF) 4 MG/ML IV SOLN
4.0000 mg | Freq: Once | INTRAVENOUS | Status: AC
  Administered 2018-04-19: 4 mg via INTRAVENOUS
  Filled 2018-04-19: qty 1

## 2018-04-19 MED ORDER — TRAMADOL HCL 50 MG PO TABS: 50.0000 mg | ORAL_TABLET | Freq: Four times a day (QID) | ORAL | 0 refills | Status: DC | PRN

## 2018-04-19 NOTE — Discharge Instructions (Signed)
Take tylenol, motrin for pain   Take tramadol for severe pain   Increase your lasix to 40 mg daily.   See your doctor for further evaluation   Return to ER if you have worse leg swelling, flank pain, vomiting, fever

## 2018-04-19 NOTE — ED Provider Notes (Signed)
[facility] DEPART[facility] Provider Note   CSN: [id number] Arrival date & time: 04/19/18  1825    History   Chief Complaint No chief complaint on file.   HPI PETE MERTEN is a 63 y.o. female hx of asthma, restless leg syndrome, here presenting with peripheral edema, right-sided abdominal pain, headaches.  Patient has constellation of symptoms.  Patient has been having arm and leg edema for the last month or so and was thought to have medication side effects.  Patient also states that she hit her head about a month ago has persistent headaches.  She also states that she has restless leg syndromes and felt " hot all over" but has no fevers.  Patient told me that she has acute onset of right flank pain that began this morning.  She states that the pain is sharp and nonradiating and not associated with any blood in her urine or dysuria.  However, patient does follow-up with [facility] and was seen about a week ago at the primary care doctor's office and was complaining of abdominal pain and there was a consideration for CT abdomen pelvis at that time.  She states that she is not aware of that discussion.  Patient denies any vomiting or diarrhea.      The history is provided by the patient.    Past Medical History:  Diagnosis Date  . Arthritis   . Asthma   . Constipation   . Depression   . Heart murmur    in the past, they cant hear it no  . Nocturia   . PONV (postoperative nausea and vomiting)   . Shortness of breath    with exertion    Patient Active Problem List   Diagnosis Date Noted  . LPRD (laryngopharyngeal reflux disease) 04/18/2017  . Asthma, not well controlled, mild persistent, with acute exacerbation 04/18/2017  . Inflammatory dermatosis 04/18/2017  . Other allergic rhinitis 04/18/2017  . OTH&UNSPEC SUP INJURY SHLDR&UPPER ARM INF 08/23/2008  . CONSTIPATION, HX OF 08/23/2008  . ARTHROSCOPY, HX OF 08/23/2008  . CHOLECYSTECTOMY, HX OF  08/23/2008  . OTHER POSTSURGICAL STATUS OTHER 08/23/2008  . ACQUIRED ABSENCE OF BOTH CERVIX AND UTERUS 08/23/2008  . UNSPEC LOCAL INFECTION SKIN&SUBCUTANEOUS TISSUE 08/06/2008    Past Surgical History:  Procedure Laterality Date  . ABDOMINAL HYSTERECTOMY    . CERVICAL DISC SURGERY     x 3  . CHOLECYSTECTOMY    . KNEE ARTHROSCOPY     right  . POSTERIOR CERVICAL FUSION/FORAMINOTOMY  02/17/2012   Procedure: POSTERIOR CERVICAL FUSION/FORAMINOTOMY LEVEL 2;  Surgeon: Hosie Spangle, MD;  Location: [facility];  Service: Neurosurgery;  Laterality: N/A;  Cervical five to seven posterior arthrodesis  . POSTERIOR CERVICAL FUSION/FORAMINOTOMY N/A 03/25/2012   Procedure: POSTERIOR CERVICAL FUSION/FORAMINOTOMY LEVEL 1;  Surgeon: Hosie Spangle, MD;  Location: [facility];  Service: Neurosurgery;  Laterality: N/A;  Cervical five-six posterior cervical arthrodesis and instrumentation   . SHOULDER DEBRIDEMENT     5- Right, 3 left  . SINOSCOPY    . TUBAL LIGATION       OB History   No obstetric history on file.      Home Medications    Prior to Admission medications   Medication Sig Start Date End Date Taking? Authorizing Provider  albuterol (PROAIR HFA) 108 (90 Base) MCG/ACT inhaler Inhale 2 puffs into the lungs every 4 (four) hours as needed for wheezing or shortness of breath. 04/18/17   Gareth Morgan  M, FNP  albuterol (PROVENTIL) (2.5 MG/3ML) 0.083% nebulizer solution Take 3 mLs (2.5 mg total) by nebulization every 4 (four) hours as needed for wheezing or shortness of breath. 04/18/17   Dara Hoyer, FNP  amoxicillin-clavulanate (AUGMENTIN) 875-125 MG tablet  04/09/17   [provider]  Cholecalciferol (VITAMIN D3) 2000 UNITS capsule Take 2,000 Units by mouth daily.    [provider]  cyclobenzaprine (FLEXERIL) 10 MG tablet Take 1 tablet (10 mg total) by mouth 3 (three) times daily as needed for muscle spasms. 12/17/13   Pieter Partridge, DO  estradiol (ESTRACE) 0.5 MG  tablet Take 0.5 mg by mouth daily.    [provider]  FLUoxetine (PROZAC) 40 MG capsule Take 40 mg by mouth daily.    [provider]  Fluticasone Furoate (ARNUITY ELLIPTA) 200 MCG/ACT AEPB Inhale 1 puff into the lungs daily. 04/18/17   Dara Hoyer, FNP  furosemide (LASIX) 20 MG tablet Take 20 mg by mouth daily.    [provider]  ipratropium (ATROVENT) 0.03 % nasal spray Place 2 sprays into both nostrils 3 (three) times daily. 04/18/17   Dara Hoyer, FNP  lovastatin (MEVACOR) 20 MG tablet Take 20 mg by mouth daily.    [provider]  mometasone (ELOCON) 0.1 % ointment Apply topically daily. 04/18/17   Dara Hoyer, FNP  montelukast (SINGULAIR) 10 MG tablet TAKE ONE TABLET ONE TIME DAILY AS DIRECTED 11/08/16   Kozlow, Donnamarie Poag, MD  pantoprazole (PROTONIX) 40 MG tablet TAKE 1 TABLET BY MOUTH ONCE DAILY 10/17/16   Kozlow, Donnamarie Poag, MD  pantoprazole (PROTONIX) 40 MG tablet TAKE 1 TABLET BY MOUTH ONCE DAILY 10/31/17   Kozlow, Donnamarie Poag, MD  predniSONE (DELTASONE) 10 MG tablet  04/14/17   [provider]  ranitidine (ZANTAC) 300 MG tablet Take 1 tablet (300 mg total) by mouth at bedtime. 03/29/16   Kozlow, Donnamarie Poag, MD  rOPINIRole (REQUIP) 0.5 MG tablet Take 10 mg by mouth 2 (two) times daily.    [provider]  atenolol (TENORMIN) 50 MG tablet Take 1 tablet (50 mg total) by mouth daily. Patient not taking: Reported on 06/21/2014 05/30/14 06/21/14  Pieter Partridge, DO  nortriptyline (PAMELOR) 10 MG capsule Take 1 capsule (10 mg total) by mouth at bedtime. Patient not taking: Reported on 06/21/2014 05/05/14 06/21/14  Pieter Partridge, DO    Family History Family History  Problem Relation Age of Onset  . Heart disease Mother   . Diabetes Mother   . Heart disease Father   . Diabetes Sister   . High blood pressure Brother   . Colon cancer Maternal Aunt   . Heart disease Paternal Uncle   . Heart disease Maternal Grandfather   . Heart disease Paternal Grandmother    . Heart disease Paternal Grandfather   . High blood pressure Brother   . Heart disease Brother   . Stroke Brother   . Diabetes Brother     Social History Social History   Tobacco Use  . Smoking status: Never Smoker  . Smokeless tobacco: Never Used  Substance Use Topics  . Alcohol use: Yes    Alcohol/week: 0.0 standard drinks    Comment: rarely - , 1 per month  . Drug use: No     Allergies   Celecoxib; Mobic [meloxicam]; and Naproxen sodium   Review of Systems Review of Systems  Gastrointestinal: Positive for abdominal pain.  Neurological: Positive for headaches.  All other systems  reviewed and are negative.    Physical Exam Updated Vital Signs BP (!) 174/85 (BP Location: Right Arm)   Pulse 91   Temp 97.6 F (36.4 C) (Oral)   Resp 13   Ht 5\' 3"  (1.6 m)   Wt 80.3 kg   SpO2 99%   BMI 31.35 kg/m   Physical Exam Vitals signs and nursing note reviewed.  Constitutional:      Comments: Anxious   HENT:     Head: Normocephalic.     Nose: Nose normal.     Mouth/Throat:     Mouth: Mucous membranes are moist.  Eyes:     Extraocular Movements: Extraocular movements intact.     Pupils: Pupils are equal, round, and reactive to light.  Neck:     Musculoskeletal: Normal range of motion.  Cardiovascular:     Rate and Rhythm: Normal rate.     Pulses: Normal pulses.     Heart sounds: Normal heart sounds.  Pulmonary:     Effort: Pulmonary effort is normal.     Breath sounds: Normal breath sounds.  Abdominal:     General: Abdomen is flat.     Palpations: Abdomen is soft.     Comments: Mild R CVAT   Musculoskeletal: Normal range of motion.     Comments: Trace edema bilateral legs, no calf tenderness   Skin:    General: Skin is warm.     Capillary Refill: Capillary refill takes less than 2 seconds.  Neurological:     General: No focal deficit present.     Mental Status: She is alert and oriented to person, place, and time.     Cranial Nerves: No cranial nerve  deficit.     Sensory: No sensory deficit.     Motor: No weakness.     Gait: Gait normal.  Psychiatric:        Mood and Affect: Mood normal.        Behavior: Behavior normal.      ED Treatments / Results  Labs (all labs ordered are listed, but only abnormal results are displayed) Labs Reviewed  CBC WITH DIFFERENTIAL/PLATELET  COMPREHENSIVE METABOLIC PANEL  LIPASE, BLOOD  URINALYSIS, ROUTINE W REFLEX MICROSCOPIC  BRAIN NATRIURETIC PEPTIDE  TSH  I-STAT TROPONIN, ED    EKG EKG Interpretation  Date/Time:  Sunday April 19 2018 18:35:39 EDT Ventricular Rate:  92 PR Interval:    QRS Duration: 87 QT Interval:  391 QTC Calculation: 484 R Axis:   35 Text Interpretation:  Sinus rhythm Low voltage, precordial leads No significant change since last tracing Confirmed by Wandra Arthurs [phone]) on 04/19/2018 6:48:42 PM   Radiology No results found.  Procedures Procedures (including critical care time)  Medications Ordered in ED Medications  morphine 4 MG/ML injection 4 mg (has no administration in time range)  rOPINIRole (REQUIP) tablet 1 mg (has no administration in time range)     Initial Impression / Assessment and Plan / ED Course  I have reviewed the triage vital signs and the nursing notes.  Pertinent labs & imaging results that were available during my care of the patient were reviewed by me and considered in my medical decision making (see chart for details).       Raylynne Cubbage Glasper is a 63 y.o. female here with headaches, R flank pain, abdominal pain, edema. She stopped taking her lasix so that may be contributing to her edema. Will get labs, CT head, CT ab/pel, TSH, UA.  9:03 PM Labs unremarkable. BNP 166. TSH nl. UA nl. CT ab/pel and CT head unremarkable.  She is only on Lasix 20 mg daily and does not take it every day so we will increase it to 40 mg daily since her kidney function is normal.  I am not sure why she is been having headaches and back and flank pain.   I wonder if she has some fibromyalgia that is underlying this.  We will give some tramadol for pain and increase her Lasix to 40 mg daily. Will have her follow up with her PCP again.    Final Clinical Impressions(s) / ED Diagnoses   Final diagnoses:  None    ED Discharge Orders    None       Drenda Freeze, MD 04/19/18 2104

## 2018-04-19 NOTE — ED Triage Notes (Signed)
Pt here with right sided flank pain that began this morning.  Pt also states she has felt "hot" but no fevers.  Pt denies pain with urination.

## 2018-04-19 NOTE — ED Notes (Signed)
To x-ray

## 2018-04-19 NOTE — ED Notes (Signed)
Bedside commode placed at the bedside  Lasix given

## 2018-04-22 DIAGNOSIS — G2581 Restless legs syndrome: Secondary | ICD-10-CM | POA: Diagnosis not present

## 2018-04-22 DIAGNOSIS — I6523 Occlusion and stenosis of bilateral carotid arteries: Secondary | ICD-10-CM | POA: Diagnosis not present

## 2018-04-22 DIAGNOSIS — I10 Essential (primary) hypertension: Secondary | ICD-10-CM | POA: Diagnosis not present

## 2018-04-22 DIAGNOSIS — M797 Fibromyalgia: Secondary | ICD-10-CM | POA: Diagnosis not present

## 2018-04-22 DIAGNOSIS — I700 Atherosclerosis of aorta: Secondary | ICD-10-CM | POA: Insufficient documentation

## 2018-04-22 DIAGNOSIS — K7581 Nonalcoholic steatohepatitis (NASH): Secondary | ICD-10-CM

## 2018-04-22 DIAGNOSIS — R609 Edema, unspecified: Secondary | ICD-10-CM | POA: Diagnosis not present

## 2018-04-22 HISTORY — DX: Nonalcoholic steatohepatitis (NASH): K75.81

## 2018-05-25 DIAGNOSIS — M1711 Unilateral primary osteoarthritis, right knee: Secondary | ICD-10-CM | POA: Diagnosis not present

## 2018-06-11 ENCOUNTER — Other Ambulatory Visit: Payer: Self-pay

## 2018-06-12 ENCOUNTER — Encounter: Payer: Self-pay | Admitting: Sports Medicine

## 2018-06-12 ENCOUNTER — Other Ambulatory Visit: Payer: Self-pay

## 2018-06-12 ENCOUNTER — Encounter

## 2018-06-12 ENCOUNTER — Telehealth: Payer: Self-pay | Admitting: *Deleted

## 2018-06-12 ENCOUNTER — Ambulatory Visit: Payer: PPO | Admitting: Sports Medicine

## 2018-06-12 ENCOUNTER — Ambulatory Visit (INDEPENDENT_AMBULATORY_CARE_PROVIDER_SITE_OTHER): Payer: PPO

## 2018-06-12 VITALS — BP 125/66 | HR 91 | Temp 96.9°F | Resp 16 | Ht 63.0 in | Wt 171.0 lb

## 2018-06-12 DIAGNOSIS — M722 Plantar fascial fibromatosis: Secondary | ICD-10-CM

## 2018-06-12 DIAGNOSIS — I739 Peripheral vascular disease, unspecified: Secondary | ICD-10-CM

## 2018-06-12 DIAGNOSIS — M79672 Pain in left foot: Secondary | ICD-10-CM | POA: Diagnosis not present

## 2018-06-12 DIAGNOSIS — M79671 Pain in right foot: Secondary | ICD-10-CM

## 2018-06-12 MED ORDER — TRIAMCINOLONE ACETONIDE 10 MG/ML IJ SUSP
10.0000 mg | Freq: Once | INTRAMUSCULAR | Status: AC
  Administered 2018-06-12: 10:00:00 10 mg

## 2018-06-12 NOTE — Patient Instructions (Signed)
STRAPPING INSTRUCTIONS  Strapping's need to be worn for 5 days for maximum benefit.  If you next appointment is before 5 days, please remove prior to coming in.  Try to avoid putting lotion on feet during the taping process.  The tape will not stick as well and will not be as effective.  If you were given stretching exercises, start these after removal of the tape.  These exercises will actually loosen the tape and you may not receive full benefit of the strapping.  It is important that you wear sturdy shoes at all times when walking.  Bedroom shoes, slippers, flip flops, etc. are not acceptable.  **If at any time while wearing the strapping you should notice any irritation such as a rash, redness, or itching, remove the tape and wash your foot/feet thoroughly.  BATHING INSTRUCTIONS  Take a washcloth, fold it a few times and tape it around your ankle.   Then take a garbage bag, place it over your foot and angle and tape it above and below the washcloth.  TAKE A QUICK SHOWER.  The washcloth should absorb any water that runs down into the garbage bag.  If your foot gets wet, take a towel and absorb as much of the water as possible. You can also take a blow dryer, put it on low heat, and dry your bandage.  For tennis shoes recommend:  Kandy Garrison Ascis New balance Saucony Can be purchased at [employer] sports or [employer]  Vionic  SAS Can be purchased at [employer] or [employer]   For work shoes recommend: [employer] Work [employer]  Can be purchased at a variety of places or Engineer, maintenance ([place])   For casual shoes recommend: Vionic  Can be purchased at [employer] or [employer]   Plantar Fasciitis Rehab Ask your health care provider which exercises are safe for you. Do exercises exactly as told by your health care provider and adjust them as directed. It is normal to feel mild stretching, pulling, tightness, or discomfort as you do these exercises, but you should stop right away if you feel sudden pain or  your pain gets worse. Do not begin these exercises until told by your health care provider. Stretching and range of motion exercises These exercises warm up your muscles and joints and improve the movement and flexibility of your foot. These exercises also help to relieve pain. Exercise A: Plantar fascia stretch  1. Sit with your left / right leg crossed over your opposite knee. 2. Hold your heel with one hand with that thumb near your arch. With your other hand, hold your toes and gently pull them back toward the top of your foot. You should feel a stretch on the bottom of your toes or your foot or both. 3. Hold this stretch for__________ seconds. 4. Slowly release your toes and return to the starting position. Repeat __________ times. Complete this exercise __________ times a day. Exercise B: Gastroc, standing  1. Stand with your hands against a wall. 2. Extend your left / right leg behind you, and bend your front knee slightly. 3. Keeping your heels on the floor and keeping your back knee straight, shift your weight toward the wall without arching your back. You should feel a gentle stretch in your left / right calf. 4. Hold this position for __________ seconds. Repeat __________ times. Complete this exercise __________ times a day. Exercise C: Soleus, standing 1. Stand with your hands against a wall. 2. Extend your left / right leg behind  you, and bend your front knee slightly. 3. Keeping your heels on the floor, bend your back knee and slightly shift your weight over the back leg. You should feel a gentle stretch deep in your calf. 4. Hold this position for __________ seconds. Repeat __________ times. Complete this exercise __________ times a day. Exercise D: Gastrocsoleus, standing 1. Stand with the ball of your left / right foot on a step. The ball of your foot is on the walking surface, right under your toes. 2. Keep your other foot firmly on the same step. 3. Hold onto the wall or  a railing for balance. 4. Slowly lift your other foot, allowing your body weight to press your heel down over the edge of the step. You should feel a stretch in your left / right calf. 5. Hold this position for __________ seconds. 6. Return both feet to the step. 7. Repeat this exercise with a slight bend in your left / right knee. Repeat __________ times with your left / right knee straight and __________ times with your left / right knee bent. Complete this exercise __________ times a day. Balance exercise This exercise builds your balance and strength control of your arch to help take pressure off your plantar fascia. Exercise E: Single leg stand 1. Without shoes, stand near a railing or in a doorway. You may hold onto the railing or door frame as needed. 2. Stand on your left / right foot. Keep your big toe down on the floor and try to keep your arch lifted. Do not let your foot roll inward. 3. Hold this position for __________ seconds. 4. If this exercise is too easy, you can try it with your eyes closed or while standing on a pillow. Repeat __________ times. Complete this exercise __________ times a day. This information is not intended to replace advice given to you by your health care provider. Make sure you discuss any questions you have with your health care provider. Document Released: 01/21/2005 Document Revised: 09/26/2015 Document Reviewed: 12/05/2014 Elsevier Interactive Patient Education  2019 [employer].

## 2018-06-12 NOTE — Progress Notes (Signed)
Subjective: Kathy [name] is a 63 y.o. female patient presents to office with complaint of moderate arch and heel pain on the left and right with swelling of both feet legs and ankles. Patient admits to post static dyskinesia for since August after walking on the beach states since then she has had issues with her feet hurting and slowly the swelling has started states that the pain is sharp burning aching to both her feet and legs states that pain is 7 out of 10 has tried soaking in cold water elevation also went to the emergency room where she was told on March 15 to increase her Lasix to 40 mg however she has not taking her Lasix for the swelling on a regular basis because she feels like it makes her dehydrated.  Patient also suffers with chronic pain issues and was also told that she has restless leg and fibromyalgia currently on Cymbalta diclofenac Requip and tramadol.  Patient denies any other symptoms like nausea vomiting fever chills or any other constitutional symptoms at this time.  Denies any other pedal complaints.   Review of Systems  Musculoskeletal: Positive for joint pain and myalgias.  All other systems reviewed and are negative.    Patient Active Problem List   Diagnosis Date Noted  . Edema 03/30/2018  . Prediabetes 03/30/2018  . Atherosclerosis of both carotid arteries 01/01/2018  . Thyroid nodule 01/01/2018  . Laryngopharyngeal reflux (LPR) 11/19/2017  . Hormone replacement therapy 10/28/2017  . Chronic cough 10/08/2017  . Dysphonia 10/08/2017  . Pulmonary nodule 09/09/2017  . LPRD (laryngopharyngeal reflux disease) 04/18/2017  . Asthma, not well controlled, mild persistent, with acute exacerbation 04/18/2017  . Inflammatory dermatosis 04/18/2017  . Other allergic rhinitis 04/18/2017  . Bilateral knee pain 03/26/2017  . Bilateral shoulder pain 03/26/2017  . Fibromyalgia 12/02/2016  . Hypothyroidism (acquired) 07/02/2016  . Essential hypertension 05/20/2016  .  Localized edema 05/20/2016  . Exertional shortness of breath 11/22/2015  . Near syncope 11/22/2015  . Anxiety 08/14/2015  . Degeneration of lumbar intervertebral disc 04/05/2015  . GERD (gastroesophageal reflux disease) 04/05/2015  . Recurrent major depressive disorder, in remission ([place]) 04/05/2015  . Restless legs syndrome 04/05/2015  . Vitamin B12 deficiency 04/05/2015  . Vitamin D deficiency disease 04/05/2015  . Chronic sinusitis 04/04/2015  . Malaise and fatigue 04/04/2015  . Mixed hyperlipidemia 04/04/2015  . Osteoarthritis of multiple joints 04/04/2015  . Osteopenia 04/04/2015  . OTH&UNSPEC SUP INJURY SHLDR&UPPER ARM INF 08/23/2008  . CONSTIPATION, HX OF 08/23/2008  . ARTHROSCOPY, HX OF 08/23/2008  . CHOLECYSTECTOMY, HX OF 08/23/2008  . OTHER POSTSURGICAL STATUS OTHER 08/23/2008  . ACQUIRED ABSENCE OF BOTH CERVIX AND UTERUS 08/23/2008  . History of arthrodesis 08/23/2008  . UNSPEC LOCAL INFECTION SKIN&SUBCUTANEOUS TISSUE 08/06/2008    Current Outpatient Medications on File Prior to Visit  Medication Sig Dispense Refill  . cyclobenzaprine (FLEXERIL) 10 MG tablet Take 1 tablet (10 mg total) by mouth 3 (three) times daily as needed for muscle spasms. 90 tablet 3  . diclofenac (VOLTAREN) 75 MG EC tablet diclofenac sodium 75 mg tablet,delayed release    . DULoxetine (CYMBALTA) 60 MG capsule Take by mouth.    . estradiol (ESTRACE) 0.5 MG tablet Take 0.5 mg by mouth daily.    . Fluticasone Furoate (ARNUITY ELLIPTA) 200 MCG/ACT AEPB Inhale 1 puff into the lungs daily. 30 each 5  . furosemide (LASIX) 40 MG tablet Take 1 tablet (40 mg total) by mouth daily. 30 tablet 0  .  levothyroxine (SYNTHROID) 50 MCG tablet Take 50 mcg by mouth daily before breakfast.    . pantoprazole (PROTONIX) 40 MG tablet TAKE 1 TABLET BY MOUTH ONCE DAILY 30 tablet 0  . rOPINIRole (REQUIP) 0.5 MG tablet Take 10 mg by mouth 2 (two) times daily.     No current facility-administered medications on file prior  to visit.     Allergies  Allergen Reactions  . Gabapentin Swelling    From 06/21/2013 OV: Pt thinks it affects her speech some "more thick tongued".  . Celecoxib Itching  . Clonazepam Other (See Comments)    syncope  . Losartan Other (See Comments)  . Mobic [Meloxicam] Itching  . Naproxen Sodium Itching    Objective: Physical Exam General: The patient is alert and oriented x3 in no acute distress.  Dermatology: Skin is warm, dry and supple bilateral lower extremities. Nails 1-10 are normal. There is no erythema, edema, no eccymosis, no open lesions present. Integument is otherwise unremarkable.  Vascular: Dorsalis Pedis pulse and Posterior Tibial pulse are 1/4 bilateral. Capillary fill time is immediate to all digits.  1+ pitting edema bilateral with early venous stasis skin changes and significant varicosities.  Neurological: Grossly intact to light touch with an achilles reflex of +2/5 and a  negative Tinel's sign bilateral however there is mild hypersensitivity over the posterior tibial nerve course bilateral.  Musculoskeletal: Tenderness to palpation at the medial calcaneal tubercale and through the insertion of the plantar fascia on the left greater than right foot with pain extending out into the arch. No pain with compression of calcaneus bilateral. No pain with tuning fork to calcaneus bilateral. No pain with calf compression bilateral. There is decreased Ankle joint range of motion bilateral. All other joints range of motion within normal limits bilateral. Strength 5/5 in all groups bilateral.   Gait: Unassisted, Antalgi  Xray, Right/Left foot:  Normal osseous mineralization. Joint spaces preserved. No fracture/dislocation/boney destruction.  Minimal calcaneal spur present with mild thickening of plantar fascia. No other soft tissue abnormalities or radiopaque foreign bodies.   Assessment and Plan: Problem List Items Addressed This Visit    None    Visit Diagnoses     Plantar fasciitis, bilateral    -  Primary   Relevant Medications   triamcinolone acetonide (KENALOG) 10 MG/ML injection 10 mg (Completed)   Bilateral foot pain       Relevant Orders   DG Foot Complete Right   DG Foot Complete Left   PVD (peripheral vascular disease) ([place])          -Complete examination performed.  -Xrays reviewed -Discussed with patient in detail the condition of plantar fasciitis untreated and likely secondary condition of PVD/edema, how this occurs and general treatment options. Explained both conservative and surgical treatments.  -After oral consent and aseptic prep, injected a mixture containing 1 ml of 2%  plain lidocaine, 1 ml 0.5% plain marcaine, 0.5 ml of kenalog 10 and 0.5 ml of dexamethasone phosphate into leftand right heel. Post-injection care discussed with patient.  -Continue with taking diclofenac -Recommended good supportive shoes and advised use of OTC insert. Explained to patient that if these orthoses work well, we will continue with these. If these do not improve her condition and  pain, we will consider custom molded orthoses however her insurance may not cover them and if not will benefit from power steps or super feet orthotics if taping helps. -Applied plantar fascial strapping bilateral this visit and advised patient to keep clean dry  and intact for 5-7 days -Explained and dispensed to patient daily stretching exercises. -Recommend patient to ice affected area 1-2x daily. -For PVD ordered venous reflux testing for further evaluation of swelling advised patient that since her kidney function is normal and she has no cardiac issues likely this could be in relation to leaking valves and she may require follow-up with a vein and vascular specialist and compression garments -Patient to return to office after venous reflux testing for follow up or sooner if problems or questions arise.  Landis Martins, DPM

## 2018-06-12 NOTE — Progress Notes (Signed)
   Subjective:    Patient ID: Kathy [name], female    DOB: 1955/03/26, 63 y.o.   MRN: [id number]  HPI    Review of Systems  Musculoskeletal: Positive for arthralgias, joint swelling and myalgias.  All other systems reviewed and are negative.      Objective:   Physical Exam        Assessment & Plan:

## 2018-06-15 ENCOUNTER — Other Ambulatory Visit: Payer: Self-pay | Admitting: Sports Medicine

## 2018-06-15 DIAGNOSIS — I739 Peripheral vascular disease, unspecified: Secondary | ICD-10-CM

## 2018-06-15 DIAGNOSIS — M722 Plantar fascial fibromatosis: Secondary | ICD-10-CM

## 2018-06-15 DIAGNOSIS — M79671 Pain in right foot: Secondary | ICD-10-CM

## 2018-06-16 DIAGNOSIS — M5137 Other intervertebral disc degeneration, lumbosacral region: Secondary | ICD-10-CM | POA: Diagnosis not present

## 2018-06-16 DIAGNOSIS — M1711 Unilateral primary osteoarthritis, right knee: Secondary | ICD-10-CM | POA: Diagnosis not present

## 2018-06-16 NOTE — Telephone Encounter (Signed)
Ok please inform patient of delay of testing Thanks Dr Chauncey Cruel

## 2018-06-16 NOTE — Telephone Encounter (Signed)
[facility] states they are not currently scheduling for venous reflux because they do not have a doctor for the consultation required after procedure.

## 2018-06-16 NOTE — Telephone Encounter (Signed)
-----   Message from Landis Martins, [place] sent at 06/12/2018 10:29 AM EDT ----- Regarding: Venous Reflux Testing Swelling in both extremities Eval veins for reflux

## 2018-06-17 NOTE — Telephone Encounter (Signed)
Awesome!

## 2018-06-17 NOTE — Telephone Encounter (Signed)
I called pt and informed [name] states they are not scheduling Venous reflux studies and to call back after 06/19/2018. Pt states understanding. Pt states she would like [name] to know the taping applied to her feet helped she is not having any pain, swelling or burning.

## 2018-06-22 DIAGNOSIS — I700 Atherosclerosis of aorta: Secondary | ICD-10-CM | POA: Diagnosis not present

## 2018-06-22 DIAGNOSIS — M797 Fibromyalgia: Secondary | ICD-10-CM | POA: Diagnosis not present

## 2018-06-22 DIAGNOSIS — M8949 Other hypertrophic osteoarthropathy, multiple sites: Secondary | ICD-10-CM | POA: Diagnosis not present

## 2018-06-22 DIAGNOSIS — Z6830 Body mass index (BMI) 30.0-30.9, adult: Secondary | ICD-10-CM | POA: Diagnosis not present

## 2018-06-22 DIAGNOSIS — E6609 Other obesity due to excess calories: Secondary | ICD-10-CM | POA: Diagnosis not present

## 2018-06-22 DIAGNOSIS — I10 Essential (primary) hypertension: Secondary | ICD-10-CM | POA: Diagnosis not present

## 2018-06-22 DIAGNOSIS — E039 Hypothyroidism, unspecified: Secondary | ICD-10-CM | POA: Diagnosis not present

## 2018-06-22 DIAGNOSIS — R600 Localized edema: Secondary | ICD-10-CM | POA: Diagnosis not present

## 2018-06-22 DIAGNOSIS — M1711 Unilateral primary osteoarthritis, right knee: Secondary | ICD-10-CM | POA: Diagnosis not present

## 2018-06-22 DIAGNOSIS — I6523 Occlusion and stenosis of bilateral carotid arteries: Secondary | ICD-10-CM | POA: Diagnosis not present

## 2018-06-22 DIAGNOSIS — K7581 Nonalcoholic steatohepatitis (NASH): Secondary | ICD-10-CM | POA: Diagnosis not present

## 2018-06-22 DIAGNOSIS — R7303 Prediabetes: Secondary | ICD-10-CM | POA: Diagnosis not present

## 2018-06-22 DIAGNOSIS — E782 Mixed hyperlipidemia: Secondary | ICD-10-CM | POA: Diagnosis not present

## 2018-06-22 DIAGNOSIS — Z0000 Encounter for general adult medical examination without abnormal findings: Secondary | ICD-10-CM | POA: Diagnosis not present

## 2018-06-23 ENCOUNTER — Telehealth: Payer: Self-pay | Admitting: Sports Medicine

## 2018-06-23 DIAGNOSIS — R609 Edema, unspecified: Secondary | ICD-10-CM

## 2018-06-23 DIAGNOSIS — I739 Peripheral vascular disease, unspecified: Secondary | ICD-10-CM

## 2018-06-23 NOTE — Addendum Note (Signed)
Addended by: Harriett Sine D on: 06/23/2018 03:18 PM   Modules accepted: Orders

## 2018-06-23 NOTE — Telephone Encounter (Signed)
I informed pt, [name] was still not performing the venous insufficiency testing, and offered to order in [place]. Pt states she will be fine with an appt in [place]. I told pt I would Schedule with [facility] [phone] or [phone].

## 2018-06-23 NOTE — Telephone Encounter (Signed)
Ok thanks 

## 2018-06-23 NOTE — Telephone Encounter (Signed)
[facility] states they are not scheduling venous insufficiency testing at this time.

## 2018-06-23 NOTE — Telephone Encounter (Signed)
Faxed orders to [facility]. 

## 2018-06-23 NOTE — Telephone Encounter (Signed)
Patient called [place] this afternoon stating that she has left you messages about scheduling her venous reflux test but hasn't heard anything back yet. She said she can be reached at [phone]

## 2018-06-24 ENCOUNTER — Telehealth (HOSPITAL_COMMUNITY): Payer: Self-pay | Admitting: Rehabilitation

## 2018-06-24 NOTE — Telephone Encounter (Signed)
The above patient or their representative was contacted and gave the following answers to these questions:         Do you have any of the following symptoms? No  Fever                    Cough                   Shortness of breath  Do  you have any of the following other symptoms? No   muscle pain         vomiting,        diarrhea        rash         weakness        red eye        abdominal pain         bruising          bruising or bleeding              joint pain           severe headache    Have you been in contact with someone who was or has been sick in the past 2 weeks? No  Yes                 Unsure                         Unable to assess   Does the person that you were in contact with have any of the following symptoms?   Cough         shortness of breath           muscle pain         vomiting,            diarrhea            rash            weakness           fever            red eye           abdominal pain           bruising  or  bleeding                joint pain                severe headache               Have you  or someone you have been in contact with traveled internationally in th last month? No        If yes, which countries?   Have you  or someone you have been in contact with traveled outside  in th last month? No         If yes, which state and city?   COMMENTS OR ACTION PLAN FOR THIS PATIENT:          

## 2018-06-25 ENCOUNTER — Other Ambulatory Visit: Payer: Self-pay

## 2018-06-25 ENCOUNTER — Ambulatory Visit (HOSPITAL_COMMUNITY)
Admission: RE | Admit: 2018-06-25 | Discharge: 2018-06-25 | Disposition: A | Discharge status: Home / Self Care | Payer: PPO | Source: Ambulatory Visit | Attending: Sports Medicine | Admitting: Sports Medicine

## 2018-06-25 DIAGNOSIS — I739 Peripheral vascular disease, unspecified: Secondary | ICD-10-CM | POA: Diagnosis not present

## 2018-06-25 DIAGNOSIS — R609 Edema, unspecified: Secondary | ICD-10-CM | POA: Diagnosis not present

## 2018-06-30 ENCOUNTER — Telehealth: Payer: Self-pay | Admitting: *Deleted

## 2018-06-30 DIAGNOSIS — R609 Edema, unspecified: Secondary | ICD-10-CM

## 2018-06-30 DIAGNOSIS — I739 Peripheral vascular disease, unspecified: Secondary | ICD-10-CM

## 2018-06-30 NOTE — Telephone Encounter (Signed)
Left message for pt to call for results and recommendations.

## 2018-06-30 NOTE — Telephone Encounter (Signed)
Pt called and I informed of Dr. Leeanne Rio review of results and recommendation for referral to Vein and Vascular doctors. I offered to refer pt to [facility] where she was tested and she accepted. Referral, clinical and demographics faxed to [facility].

## 2018-06-30 NOTE — Telephone Encounter (Signed)
-----   Message from Earl, [place] sent at 06/25/2018  1:08 PM EDT ----- There is abnormal reflux support of valve issues that could be adding to issues with her swelling. Patient should see and vein and vascular doctor for further recommendation on how to manage her edema; we can refer her if she is will to see a [facility] doc for this Thanks Dr. Chauncey Cruel

## 2018-07-07 DIAGNOSIS — Z1231 Encounter for screening mammogram for malignant neoplasm of breast: Secondary | ICD-10-CM | POA: Diagnosis not present

## 2018-07-09 ENCOUNTER — Telehealth (HOSPITAL_COMMUNITY): Payer: Self-pay | Admitting: Rehabilitation

## 2018-07-09 DIAGNOSIS — M797 Fibromyalgia: Secondary | ICD-10-CM | POA: Diagnosis not present

## 2018-07-09 DIAGNOSIS — I10 Essential (primary) hypertension: Secondary | ICD-10-CM | POA: Diagnosis not present

## 2018-07-09 DIAGNOSIS — M8949 Other hypertrophic osteoarthropathy, multiple sites: Secondary | ICD-10-CM | POA: Diagnosis not present

## 2018-07-09 DIAGNOSIS — I6523 Occlusion and stenosis of bilateral carotid arteries: Secondary | ICD-10-CM | POA: Diagnosis not present

## 2018-07-09 DIAGNOSIS — R609 Edema, unspecified: Secondary | ICD-10-CM | POA: Diagnosis not present

## 2018-07-09 DIAGNOSIS — F3340 Major depressive disorder, recurrent, in remission, unspecified: Secondary | ICD-10-CM | POA: Diagnosis not present

## 2018-07-09 DIAGNOSIS — E039 Hypothyroidism, unspecified: Secondary | ICD-10-CM | POA: Diagnosis not present

## 2018-07-09 DIAGNOSIS — I700 Atherosclerosis of aorta: Secondary | ICD-10-CM | POA: Diagnosis not present

## 2018-07-09 DIAGNOSIS — Z01818 Encounter for other preprocedural examination: Secondary | ICD-10-CM | POA: Diagnosis not present

## 2018-07-09 DIAGNOSIS — K219 Gastro-esophageal reflux disease without esophagitis: Secondary | ICD-10-CM | POA: Diagnosis not present

## 2018-07-09 NOTE — Telephone Encounter (Signed)

## 2018-07-10 ENCOUNTER — Ambulatory Visit (INDEPENDENT_AMBULATORY_CARE_PROVIDER_SITE_OTHER): Payer: PPO | Admitting: Vascular Surgery

## 2018-07-10 ENCOUNTER — Encounter: Payer: Self-pay | Admitting: Vascular Surgery

## 2018-07-10 ENCOUNTER — Other Ambulatory Visit: Payer: Self-pay

## 2018-07-10 VITALS — BP 131/73 | HR 95 | Resp 20 | Ht 63.0 in | Wt 170.0 lb

## 2018-07-10 DIAGNOSIS — M7989 Other specified soft tissue disorders: Secondary | ICD-10-CM | POA: Diagnosis not present

## 2018-07-10 DIAGNOSIS — M48062 Spinal stenosis, lumbar region with neurogenic claudication: Secondary | ICD-10-CM | POA: Diagnosis not present

## 2018-07-10 DIAGNOSIS — M47816 Spondylosis without myelopathy or radiculopathy, lumbar region: Secondary | ICD-10-CM | POA: Diagnosis not present

## 2018-07-10 DIAGNOSIS — M5136 Other intervertebral disc degeneration, lumbar region: Secondary | ICD-10-CM | POA: Diagnosis not present

## 2018-07-10 DIAGNOSIS — M5442 Lumbago with sciatica, left side: Secondary | ICD-10-CM | POA: Diagnosis not present

## 2018-07-10 NOTE — Progress Notes (Signed)
Patient ID: [name], female   DOB: 04-14-1955, 63 y.o.   MRN: [id number]  Reason for Consult: New Patient (Initial Visit)   Referred by Raina Mina., MD  Subjective:     HPI:  [name] is a 63 y.o. female presents evaluation for bilateral lower extremity swelling.  States that a few months ago she had significant swelling bilateral legs right greater than left.  She did have an injection of her right foot this has improved the swelling significant.  She does wear knee-high compression stockings but only in the wintertime.  She has had multiple neck and shoulder surgeries.  Chief complaints are bilateral arm and leg restless syndromes.  She does take ropinirole for this states that it is not helping much.  She is not having tissue loss or ulceration.  Does have some spider veins but no overt varicosities.  Has never had any venous or arterial intervention of her lower extremities.  Past Medical History:  Diagnosis Date  . Arthritis   . Asthma   . Constipation   . Depression   . Heart murmur    in the past, they cant hear it no  . Nocturia   . PONV (postoperative nausea and vomiting)   . Shortness of breath    with exertion   Family History  Problem Relation Age of Onset  . Heart disease Mother   . Diabetes Mother   . Heart disease Father   . Diabetes Sister   . High blood pressure Brother   . Colon cancer Maternal Aunt   . Heart disease Paternal Uncle   . Heart disease Maternal Grandfather   . Heart disease Paternal Grandmother   . Heart disease Paternal Grandfather   . High blood pressure Brother   . Heart disease Brother   . Stroke Brother   . Diabetes Brother    Past Surgical History:  Procedure Laterality Date  . ABDOMINAL HYSTERECTOMY    . CERVICAL DISC SURGERY     x 3  . CHOLECYSTECTOMY    . KNEE ARTHROSCOPY     right  . POSTERIOR CERVICAL FUSION/FORAMINOTOMY  02/17/2012   Procedure: POSTERIOR CERVICAL FUSION/FORAMINOTOMY LEVEL 2;   Surgeon: Hosie Spangle, MD;  Location: [facility];  Service: Neurosurgery;  Laterality: N/A;  Cervical five to seven posterior arthrodesis  . POSTERIOR CERVICAL FUSION/FORAMINOTOMY N/A 03/25/2012   Procedure: POSTERIOR CERVICAL FUSION/FORAMINOTOMY LEVEL 1;  Surgeon: Hosie Spangle, MD;  Location: [facility];  Service: Neurosurgery;  Laterality: N/A;  Cervical five-six posterior cervical arthrodesis and instrumentation   . SHOULDER DEBRIDEMENT     5- Right, 3 left  . SINOSCOPY    . TUBAL LIGATION      Short Social History:  Social History   Tobacco Use  . Smoking status: Never Smoker  . Smokeless tobacco: Never Used  Substance Use Topics  . Alcohol use: Yes    Alcohol/week: 0.0 standard drinks    Comment: rarely - , 1 per month    Allergies  Allergen Reactions  . Gabapentin Swelling    From 06/21/2013 OV: Pt thinks it affects her speech some "more thick tongued".  . Celecoxib Itching  . Clonazepam Other (See Comments)    syncope  . Losartan Other (See Comments)  . Mobic [Meloxicam] Itching  . Naproxen Sodium Itching    Current Outpatient Medications  Medication Sig Dispense Refill  . cyclobenzaprine (FLEXERIL) 10 MG tablet Take 1 tablet (10 mg total) by  mouth 3 (three) times daily as needed for muscle spasms. 90 tablet 3  . diclofenac (VOLTAREN) 75 MG EC tablet diclofenac sodium 75 mg tablet,delayed release    . DULoxetine (CYMBALTA) 60 MG capsule Take by mouth.    . estradiol (ESTRACE) 0.5 MG tablet Take 0.5 mg by mouth daily.    . Fluticasone Furoate (ARNUITY ELLIPTA) 200 MCG/ACT AEPB Inhale 1 puff into the lungs daily. 30 each 5  . furosemide (LASIX) 40 MG tablet Take 1 tablet (40 mg total) by mouth daily. 30 tablet 0  . levothyroxine (SYNTHROID) 50 MCG tablet Take 50 mcg by mouth daily before breakfast.    . pantoprazole (PROTONIX) 40 MG tablet TAKE 1 TABLET BY MOUTH ONCE DAILY 30 tablet 0  . phentermine 37.5 MG capsule TAKE 1 CAPSULE BY MOUTH IN THE MORNING     . rOPINIRole (REQUIP) 0.5 MG tablet Take 10 mg by mouth 2 (two) times daily.     No current facility-administered medications for this visit.     Review of Systems  Constitutional:  Constitutional negative. HENT: HENT negative.  Eyes: Eyes negative.  Respiratory: Respiratory negative.  Cardiovascular: Positive for leg swelling.  GI: Gastrointestinal negative.  Musculoskeletal: Musculoskeletal negative.  Skin: Skin negative.  Neurological:       Restless legs and restless arms Hematologic: Hematologic/lymphatic negative.  Psychiatric: Psychiatric negative.        Objective:  Objective   Vitals:   07/10/18 0944  BP: 131/73  Pulse: 95  Resp: 20  SpO2: 95%  Weight: 170 lb (77.1 kg)  Height: 5\' 3"  (1.6 m)   Body mass index is 30.11 kg/m.  Physical Exam Constitutional:      Appearance: Normal appearance.  HENT:     Mouth/Throat:     Mouth: Mucous membranes are moist.  Eyes:     Pupils: Pupils are equal, round, and reactive to light.  Neck:     Musculoskeletal: Neck supple.  Cardiovascular:     Rate and Rhythm: Normal rate.     Pulses:          Radial pulses are 2+ on the right side and 2+ on the left side.       Dorsalis pedis pulses are 2+ on the right side and 2+ on the left side.       Posterior tibial pulses are 2+ on the right side and 2+ on the left side.  Pulmonary:     Effort: Pulmonary effort is normal.  Abdominal:     General: Abdomen is flat.     Palpations: Abdomen is soft. There is no mass.  Skin:    General: Skin is dry.  Neurological:     General: No focal deficit present.     Mental Status: She is alert.  Psychiatric:        Mood and Affect: Mood normal.        Behavior: Behavior normal.        Thought Content: Thought content normal.        Judgment: Judgment normal.     Data:  I reviewed her previously performed lower extremity reflux studies which demonstrate diameters at the saphenofemoral junction 0.770.86 right and left  respectively.  She has reflux of the saphenofemoral junction on the left 2582 ms and on the right in the proximal thigh 1071 ms and left proximal thigh 1621 ms     Assessment/Plan:     63 year old female presents for evaluation bilateral lower extremity edema which has  recently significantly improved and she does wear knee-high compression stockings in the wintertime which she states also helps.  I discussed with her continuing this as well as elevating her legs when recumbent.  Her main complaint really is restless leg syndrome which I do not think has anything to do with her vasculature and I have discussed this with her.  At this time we will not plan for any intervention and she can follow-up on an as-needed basis peer     Waynetta Sandy MD [facility]

## 2018-07-16 DIAGNOSIS — I493 Ventricular premature depolarization: Secondary | ICD-10-CM | POA: Diagnosis not present

## 2018-08-06 DIAGNOSIS — M722 Plantar fascial fibromatosis: Secondary | ICD-10-CM

## 2018-08-11 DIAGNOSIS — H524 Presbyopia: Secondary | ICD-10-CM | POA: Diagnosis not present

## 2018-08-14 ENCOUNTER — Other Ambulatory Visit (HOSPITAL_COMMUNITY): Payer: Self-pay | Admitting: Neurosurgery

## 2018-08-14 DIAGNOSIS — G9519 Other vascular myelopathies: Secondary | ICD-10-CM

## 2018-08-14 DIAGNOSIS — R29818 Other symptoms and signs involving the nervous system: Secondary | ICD-10-CM

## 2018-08-14 DIAGNOSIS — M48062 Spinal stenosis, lumbar region with neurogenic claudication: Secondary | ICD-10-CM

## 2018-08-24 ENCOUNTER — Ambulatory Visit (HOSPITAL_COMMUNITY)
Admission: RE | Admit: 2018-08-24 | Discharge: 2018-08-24 | Disposition: A | Discharge status: Home / Self Care | Payer: PPO | Source: Ambulatory Visit | Attending: Neurosurgery | Admitting: Neurosurgery

## 2018-08-24 DIAGNOSIS — Z1159 Encounter for screening for other viral diseases: Secondary | ICD-10-CM | POA: Insufficient documentation

## 2018-08-25 ENCOUNTER — Other Ambulatory Visit: Payer: Self-pay

## 2018-08-25 ENCOUNTER — Encounter (HOSPITAL_COMMUNITY): Payer: Self-pay | Admitting: *Deleted

## 2018-08-25 DIAGNOSIS — M48062 Spinal stenosis, lumbar region with neurogenic claudication: Secondary | ICD-10-CM | POA: Diagnosis not present

## 2018-08-25 LAB — SARS CORONAVIRUS 2 (TAT 6-24 HRS): SARS Coronavirus 2: NEGATIVE

## 2018-08-25 NOTE — Progress Notes (Addendum)
Spoke with pt for pre-op call. Pt denies cardiac history or HTN. Pt states she is Pre-diabetic. Does not check her blood sugar at home and does not remember what her last A1C was.  Pt states that Dr. Sherwood Gambler did a "televisit" with her today. I had called Dr. Donnella Bi office earlier today needing an H&P for patient to have her MRI with anesthesia done.   Pt had her covid test done yesterday, it is negative. Pt now understands quarantine. She did go to the grocery store but wore her mask. Pt does live alone.    Coronavirus Screening  Have you experienced the following symptoms:  Cough NO Fever (>100.49F)  NO Runny nose NO Sore throat NO Difficulty breathing/shortness of breath  NO  Have you or a family member traveled in the last 14 days and where? NO  Patient reminded that hospital visitation restrictions are in effect and the importance of the restrictions.

## 2018-08-26 NOTE — Anesthesia Preprocedure Evaluation (Addendum)
Anesthesia Evaluation  Patient identified by MRN, date of birth, ID band Patient awake    Reviewed: Allergy & Precautions, NPO status , Patient's Chart, lab work & pertinent test results  Airway Mallampati: II  TM Distance: >3 FB Neck ROM: Full    Dental no notable dental hx. (+) Teeth Intact   Pulmonary asthma ,    Pulmonary exam normal breath sounds clear to auscultation       Cardiovascular Exercise Tolerance: Good hypertension, Pt. on medications Normal cardiovascular exam Rhythm:Regular Rate:Normal     Neuro/Psych PSYCHIATRIC DISORDERS Anxiety  Neuromuscular disease    GI/Hepatic GERD  ,  Endo/Other  Hypothyroidism   Renal/GU      Musculoskeletal  (+) Arthritis ,   Abdominal   Peds  Hematology   Anesthesia Other Findings   Reproductive/Obstetrics                            Anesthesia Physical Anesthesia Plan  ASA: II  Anesthesia Plan: General   Post-op Pain Management:    Induction: Intravenous  PONV Risk Score and Plan: 3 and Treatment may vary due to age or medical condition and Ondansetron  Airway Management Planned: Oral ETT  Additional Equipment:   Intra-op Plan:   Post-operative Plan: Extubation in OR  Informed Consent:     Dental advisory given  Plan Discussed with:   Anesthesia Plan Comments:         Anesthesia Quick Evaluation

## 2018-08-27 ENCOUNTER — Ambulatory Visit (HOSPITAL_COMMUNITY)
Admission: RE | Admit: 2018-08-27 | Discharge: 2018-08-27 | Disposition: A | Discharge status: Home / Self Care | Payer: PPO | Source: Ambulatory Visit | Attending: Neurosurgery | Admitting: Neurosurgery

## 2018-08-27 ENCOUNTER — Encounter (HOSPITAL_COMMUNITY)
Admission: RE | Disposition: A | Discharge status: Home / Self Care | Payer: Self-pay | Source: Home / Self Care | Attending: Neurosurgery

## 2018-08-27 ENCOUNTER — Other Ambulatory Visit: Payer: Self-pay

## 2018-08-27 ENCOUNTER — Ambulatory Visit (HOSPITAL_COMMUNITY): Payer: PPO | Admitting: Certified Registered Nurse Anesthetist

## 2018-08-27 ENCOUNTER — Encounter (HOSPITAL_COMMUNITY): Payer: Self-pay | Admitting: Orthopedic Surgery

## 2018-08-27 ENCOUNTER — Ambulatory Visit (HOSPITAL_COMMUNITY)
Admission: RE | Admit: 2018-08-27 | Discharge: 2018-08-27 | Disposition: A | Discharge status: Home / Self Care | Payer: PPO | Source: Home / Self Care | Attending: Neurosurgery | Admitting: Neurosurgery

## 2018-08-27 DIAGNOSIS — M48062 Spinal stenosis, lumbar region with neurogenic claudication: Secondary | ICD-10-CM

## 2018-08-27 DIAGNOSIS — F419 Anxiety disorder, unspecified: Secondary | ICD-10-CM | POA: Diagnosis not present

## 2018-08-27 DIAGNOSIS — M1990 Unspecified osteoarthritis, unspecified site: Secondary | ICD-10-CM | POA: Diagnosis not present

## 2018-08-27 DIAGNOSIS — G9519 Other vascular myelopathies: Secondary | ICD-10-CM | POA: Diagnosis not present

## 2018-08-27 DIAGNOSIS — M47897 Other spondylosis, lumbosacral region: Secondary | ICD-10-CM | POA: Insufficient documentation

## 2018-08-27 DIAGNOSIS — M47816 Spondylosis without myelopathy or radiculopathy, lumbar region: Secondary | ICD-10-CM | POA: Diagnosis not present

## 2018-08-27 DIAGNOSIS — E039 Hypothyroidism, unspecified: Secondary | ICD-10-CM | POA: Diagnosis not present

## 2018-08-27 DIAGNOSIS — K219 Gastro-esophageal reflux disease without esophagitis: Secondary | ICD-10-CM | POA: Diagnosis not present

## 2018-08-27 DIAGNOSIS — I10 Essential (primary) hypertension: Secondary | ICD-10-CM | POA: Diagnosis not present

## 2018-08-27 DIAGNOSIS — Z79899 Other long term (current) drug therapy: Secondary | ICD-10-CM | POA: Insufficient documentation

## 2018-08-27 DIAGNOSIS — M545 Low back pain: Secondary | ICD-10-CM | POA: Diagnosis not present

## 2018-08-27 DIAGNOSIS — J45909 Unspecified asthma, uncomplicated: Secondary | ICD-10-CM | POA: Diagnosis not present

## 2018-08-27 HISTORY — DX: Prediabetes: R73.03

## 2018-08-27 HISTORY — DX: Gastro-esophageal reflux disease without esophagitis: K21.9

## 2018-08-27 HISTORY — PX: RADIOLOGY WITH ANESTHESIA: SHX6223

## 2018-08-27 HISTORY — DX: Essential (primary) hypertension: I10

## 2018-08-27 LAB — BASIC METABOLIC PANEL
Anion gap: 10 (ref 5–15)
BUN: 16 mg/dL (ref 6–20)
CO2: 23 mmol/L (ref 22–32)
Calcium: 8.8 mg/dL — ABNORMAL LOW (ref 8.9–10.3)
Chloride: 106 mmol/L (ref 98–111)
Creatinine, Ser: 0.79 mg/dL (ref 0.44–1.00)
GFR calc Af Amer: >60 mL/min (ref >60)
GFR calc non Af Amer: >60 mL/min (ref >60)
Glucose, Bld: 104 mg/dL — ABNORMAL HIGH (ref 70–99)
Potassium: 3.4 mmol/L — ABNORMAL LOW (ref 3.5–5.1)
Sodium: 139 mmol/L (ref 135–145)

## 2018-08-27 LAB — CBC
HCT: 42.8 % (ref 36.0–46.0)
Hemoglobin: 13.4 g/dL (ref 12.0–15.0)
MCH: 26.9 pg (ref 26.0–34.0)
MCHC: 31.3 g/dL (ref 30.0–36.0)
MCV: 85.8 fL (ref 80.0–100.0)
Platelets: 285 10*3/uL (ref 150–400)
RBC: 4.99 MIL/uL (ref 3.87–5.11)
RDW: 14.4 % (ref 11.5–15.5)
WBC: 6.7 10*3/uL (ref 4.0–10.5)
nRBC: 0 % (ref 0.0–0.2)

## 2018-08-27 LAB — GLUCOSE, CAPILLARY: Glucose-Capillary: 121 mg/dL — ABNORMAL HIGH (ref 70–99)

## 2018-08-27 SURGERY — MRI WITH ANESTHESIA
Anesthesia: General

## 2018-08-27 MED ORDER — MIDAZOLAM HCL 2 MG/2ML IJ SOLN
INTRAMUSCULAR | Status: DC | PRN
  Administered 2018-08-27: 2 mg via INTRAVENOUS

## 2018-08-27 MED ORDER — FENTANYL CITRATE (PF) 100 MCG/2ML IJ SOLN
INTRAMUSCULAR | Status: DC | PRN
  Administered 2018-08-27 (×2): 50 ug via INTRAVENOUS

## 2018-08-27 MED ORDER — LACTATED RINGERS IV SOLN
INTRAVENOUS | Status: DC
  Administered 2018-08-27: 07:00:00 via INTRAVENOUS

## 2018-08-27 NOTE — Transfer of Care (Signed)
Immediate Anesthesia Transfer of Care Note  Patient: [name]  Procedure(s) Performed: MRI LUMBAR SPINE WITHOUT CONTRAST (N/A )  Patient Location: PACU  Anesthesia Type:General  Level of Consciousness: awake, alert , oriented and patient cooperative  Airway & Oxygen Therapy: Patient Spontanous Breathing and Patient connected to nasal cannula oxygen  Post-op Assessment: Report given to RN, Post -op Vital signs reviewed and stable and Patient moving all extremities X 4  Post vital signs: Reviewed and stable  Last Vitals:  Vitals Value Taken Time  BP 123/54 08/27/18 0927  Temp 36.4 C 08/27/18 0927  Pulse 84 08/27/18 0931  Resp 22 08/27/18 0931  SpO2 93 % 08/27/18 0931  Vitals shown include unvalidated device data.  Last Pain:  Vitals:   08/27/18 0927  TempSrc:   PainSc: 2       Patients Stated Pain Goal: 3 (79/89/21 1941)  Complications: No apparent anesthesia complications

## 2018-08-28 ENCOUNTER — Encounter (HOSPITAL_COMMUNITY): Payer: Self-pay | Admitting: Radiology

## 2018-08-28 MED FILL — Phenylephrine-NaCl Pref Syr 0.4 MG/10ML-0.9% (40 MCG/ML): INTRAVENOUS | Qty: 10 | Status: AC

## 2018-08-28 MED FILL — Lidocaine HCl Local Soln Prefilled Syringe 100 MG/5ML (2%): INTRAMUSCULAR | Qty: 5 | Status: AC

## 2018-08-28 MED FILL — Dexamethasone Sodium Phosphate Inj 10 MG/ML: INTRAMUSCULAR | Qty: 1 | Status: AC

## 2018-08-28 MED FILL — Sugammadex Sodium IV 500 MG/5ML (Base Equivalent): INTRAVENOUS | Qty: 5 | Status: AC

## 2018-08-28 MED FILL — Fentanyl Citrate Preservative Free (PF) Inj 100 MCG/2ML: INTRAMUSCULAR | Qty: 2 | Status: AC

## 2018-08-28 MED FILL — Midazolam HCl Inj 2 MG/2ML (Base Equivalent): INTRAMUSCULAR | Qty: 2 | Status: AC

## 2018-08-28 MED FILL — Propofol IV Emul 200 MG/20ML (10 MG/ML): INTRAVENOUS | Qty: 20 | Status: AC

## 2018-08-28 MED FILL — Lactated Ringer's Solution: INTRAVENOUS | Qty: 1000 | Status: AC

## 2018-08-28 MED FILL — Rocuronium Bromide IV Soln 50 MG/5ML (10 MG/ML): INTRAVENOUS | Qty: 5 | Status: AC

## 2018-08-28 MED FILL — Ondansetron HCl Inj 4 MG/2ML (2 MG/ML): INTRAMUSCULAR | Qty: 2 | Status: AC

## 2018-09-01 DIAGNOSIS — M5442 Lumbago with sciatica, left side: Secondary | ICD-10-CM | POA: Diagnosis not present

## 2018-09-01 DIAGNOSIS — M5136 Other intervertebral disc degeneration, lumbar region: Secondary | ICD-10-CM | POA: Diagnosis not present

## 2018-09-01 DIAGNOSIS — M47816 Spondylosis without myelopathy or radiculopathy, lumbar region: Secondary | ICD-10-CM | POA: Diagnosis not present

## 2018-09-07 ENCOUNTER — Encounter (HOSPITAL_COMMUNITY): Payer: Self-pay | Admitting: Radiology

## 2018-09-16 DIAGNOSIS — M5136 Other intervertebral disc degeneration, lumbar region: Secondary | ICD-10-CM | POA: Diagnosis not present

## 2018-09-17 DIAGNOSIS — I700 Atherosclerosis of aorta: Secondary | ICD-10-CM | POA: Diagnosis not present

## 2018-09-17 DIAGNOSIS — E039 Hypothyroidism, unspecified: Secondary | ICD-10-CM | POA: Diagnosis not present

## 2018-09-17 DIAGNOSIS — L821 Other seborrheic keratosis: Secondary | ICD-10-CM | POA: Diagnosis not present

## 2018-09-17 DIAGNOSIS — K219 Gastro-esophageal reflux disease without esophagitis: Secondary | ICD-10-CM | POA: Diagnosis not present

## 2018-09-17 DIAGNOSIS — M797 Fibromyalgia: Secondary | ICD-10-CM | POA: Diagnosis not present

## 2018-09-17 DIAGNOSIS — M8949 Other hypertrophic osteoarthropathy, multiple sites: Secondary | ICD-10-CM | POA: Diagnosis not present

## 2018-09-17 DIAGNOSIS — E663 Overweight: Secondary | ICD-10-CM | POA: Diagnosis not present

## 2018-09-17 DIAGNOSIS — E782 Mixed hyperlipidemia: Secondary | ICD-10-CM | POA: Diagnosis not present

## 2018-09-17 DIAGNOSIS — D485 Neoplasm of uncertain behavior of skin: Secondary | ICD-10-CM | POA: Diagnosis not present

## 2018-09-17 DIAGNOSIS — L280 Lichen simplex chronicus: Secondary | ICD-10-CM | POA: Diagnosis not present

## 2018-09-17 DIAGNOSIS — I10 Essential (primary) hypertension: Secondary | ICD-10-CM | POA: Diagnosis not present

## 2018-09-17 DIAGNOSIS — M5136 Other intervertebral disc degeneration, lumbar region: Secondary | ICD-10-CM | POA: Diagnosis not present

## 2018-09-17 DIAGNOSIS — D225 Melanocytic nevi of trunk: Secondary | ICD-10-CM | POA: Diagnosis not present

## 2018-09-17 DIAGNOSIS — L309 Dermatitis, unspecified: Secondary | ICD-10-CM | POA: Diagnosis not present

## 2018-09-17 DIAGNOSIS — R7303 Prediabetes: Secondary | ICD-10-CM | POA: Diagnosis not present

## 2018-09-17 DIAGNOSIS — G2581 Restless legs syndrome: Secondary | ICD-10-CM | POA: Diagnosis not present

## 2018-10-02 DIAGNOSIS — M25512 Pain in left shoulder: Secondary | ICD-10-CM | POA: Diagnosis not present

## 2018-10-02 DIAGNOSIS — M129 Arthropathy, unspecified: Secondary | ICD-10-CM | POA: Diagnosis not present

## 2018-10-02 DIAGNOSIS — M75101 Unspecified rotator cuff tear or rupture of right shoulder, not specified as traumatic: Secondary | ICD-10-CM | POA: Diagnosis not present

## 2018-10-02 DIAGNOSIS — M25511 Pain in right shoulder: Secondary | ICD-10-CM | POA: Diagnosis not present

## 2018-10-06 DIAGNOSIS — G8929 Other chronic pain: Secondary | ICD-10-CM | POA: Diagnosis not present

## 2018-10-06 DIAGNOSIS — Z23 Encounter for immunization: Secondary | ICD-10-CM | POA: Diagnosis not present

## 2018-10-06 DIAGNOSIS — M5136 Other intervertebral disc degeneration, lumbar region: Secondary | ICD-10-CM | POA: Diagnosis not present

## 2018-10-06 DIAGNOSIS — M546 Pain in thoracic spine: Secondary | ICD-10-CM | POA: Diagnosis not present

## 2018-10-06 DIAGNOSIS — I10 Essential (primary) hypertension: Secondary | ICD-10-CM | POA: Diagnosis not present

## 2018-10-12 DIAGNOSIS — M549 Dorsalgia, unspecified: Secondary | ICD-10-CM | POA: Diagnosis not present

## 2018-10-12 DIAGNOSIS — N390 Urinary tract infection, site not specified: Secondary | ICD-10-CM | POA: Diagnosis not present

## 2018-10-17 DIAGNOSIS — S4992XA Unspecified injury of left shoulder and upper arm, initial encounter: Secondary | ICD-10-CM | POA: Diagnosis not present

## 2018-10-17 DIAGNOSIS — S22080A Wedge compression fracture of T11-T12 vertebra, initial encounter for closed fracture: Secondary | ICD-10-CM | POA: Diagnosis not present

## 2018-10-17 DIAGNOSIS — J45909 Unspecified asthma, uncomplicated: Secondary | ICD-10-CM | POA: Diagnosis not present

## 2018-10-17 DIAGNOSIS — M25512 Pain in left shoulder: Secondary | ICD-10-CM | POA: Diagnosis not present

## 2018-10-17 DIAGNOSIS — I10 Essential (primary) hypertension: Secondary | ICD-10-CM | POA: Diagnosis not present

## 2018-10-17 DIAGNOSIS — E7800 Pure hypercholesterolemia, unspecified: Secondary | ICD-10-CM | POA: Diagnosis not present

## 2018-10-17 DIAGNOSIS — Z79899 Other long term (current) drug therapy: Secondary | ICD-10-CM | POA: Diagnosis not present

## 2018-10-17 DIAGNOSIS — M545 Low back pain: Secondary | ICD-10-CM | POA: Diagnosis not present

## 2018-10-20 DIAGNOSIS — R7989 Other specified abnormal findings of blood chemistry: Secondary | ICD-10-CM | POA: Diagnosis not present

## 2018-10-20 DIAGNOSIS — S22080A Wedge compression fracture of T11-T12 vertebra, initial encounter for closed fracture: Secondary | ICD-10-CM | POA: Diagnosis not present

## 2018-10-20 DIAGNOSIS — M47816 Spondylosis without myelopathy or radiculopathy, lumbar region: Secondary | ICD-10-CM | POA: Diagnosis not present

## 2018-10-20 DIAGNOSIS — I10 Essential (primary) hypertension: Secondary | ICD-10-CM | POA: Diagnosis not present

## 2018-10-20 DIAGNOSIS — M546 Pain in thoracic spine: Secondary | ICD-10-CM | POA: Insufficient documentation

## 2018-10-20 DIAGNOSIS — G2581 Restless legs syndrome: Secondary | ICD-10-CM | POA: Diagnosis not present

## 2018-10-20 HISTORY — DX: Pain in thoracic spine: M54.6

## 2018-10-21 DIAGNOSIS — S22079A Unspecified fracture of T9-T10 vertebra, initial encounter for closed fracture: Secondary | ICD-10-CM | POA: Diagnosis not present

## 2018-10-21 DIAGNOSIS — S22080A Wedge compression fracture of T11-T12 vertebra, initial encounter for closed fracture: Secondary | ICD-10-CM | POA: Diagnosis not present

## 2018-10-23 DIAGNOSIS — Z1159 Encounter for screening for other viral diseases: Secondary | ICD-10-CM | POA: Diagnosis not present

## 2018-10-27 DIAGNOSIS — S22070A Wedge compression fracture of T9-T10 vertebra, initial encounter for closed fracture: Secondary | ICD-10-CM | POA: Diagnosis not present

## 2018-10-27 DIAGNOSIS — M546 Pain in thoracic spine: Secondary | ICD-10-CM | POA: Diagnosis not present

## 2018-10-27 DIAGNOSIS — S22080A Wedge compression fracture of T11-T12 vertebra, initial encounter for closed fracture: Secondary | ICD-10-CM | POA: Diagnosis not present

## 2018-10-27 DIAGNOSIS — Z6830 Body mass index (BMI) 30.0-30.9, adult: Secondary | ICD-10-CM | POA: Diagnosis not present

## 2018-10-29 DIAGNOSIS — S22070A Wedge compression fracture of T9-T10 vertebra, initial encounter for closed fracture: Secondary | ICD-10-CM | POA: Diagnosis not present

## 2018-10-29 DIAGNOSIS — S22080A Wedge compression fracture of T11-T12 vertebra, initial encounter for closed fracture: Secondary | ICD-10-CM | POA: Diagnosis not present

## 2018-11-05 DIAGNOSIS — M5030 Other cervical disc degeneration, unspecified cervical region: Secondary | ICD-10-CM | POA: Diagnosis not present

## 2018-11-05 DIAGNOSIS — I10 Essential (primary) hypertension: Secondary | ICD-10-CM | POA: Diagnosis not present

## 2018-11-05 DIAGNOSIS — G2581 Restless legs syndrome: Secondary | ICD-10-CM | POA: Diagnosis not present

## 2018-11-05 DIAGNOSIS — M5136 Other intervertebral disc degeneration, lumbar region: Secondary | ICD-10-CM | POA: Diagnosis not present

## 2018-11-05 DIAGNOSIS — M8000XD Age-related osteoporosis with current pathological fracture, unspecified site, subsequent encounter for fracture with routine healing: Secondary | ICD-10-CM | POA: Diagnosis not present

## 2018-11-05 DIAGNOSIS — E663 Overweight: Secondary | ICD-10-CM | POA: Diagnosis not present

## 2018-11-07 DIAGNOSIS — S335XXA Sprain of ligaments of lumbar spine, initial encounter: Secondary | ICD-10-CM | POA: Diagnosis not present

## 2018-11-07 DIAGNOSIS — M546 Pain in thoracic spine: Secondary | ICD-10-CM | POA: Diagnosis not present

## 2018-11-07 DIAGNOSIS — M47816 Spondylosis without myelopathy or radiculopathy, lumbar region: Secondary | ICD-10-CM | POA: Diagnosis not present

## 2018-11-07 DIAGNOSIS — M48061 Spinal stenosis, lumbar region without neurogenic claudication: Secondary | ICD-10-CM | POA: Diagnosis not present

## 2018-11-09 DIAGNOSIS — M546 Pain in thoracic spine: Secondary | ICD-10-CM | POA: Diagnosis not present

## 2018-11-10 DIAGNOSIS — S22080A Wedge compression fracture of T11-T12 vertebra, initial encounter for closed fracture: Secondary | ICD-10-CM | POA: Diagnosis not present

## 2018-11-10 DIAGNOSIS — S22070A Wedge compression fracture of T9-T10 vertebra, initial encounter for closed fracture: Secondary | ICD-10-CM | POA: Diagnosis not present

## 2018-11-10 DIAGNOSIS — M546 Pain in thoracic spine: Secondary | ICD-10-CM | POA: Diagnosis not present

## 2018-11-10 DIAGNOSIS — Z6831 Body mass index (BMI) 31.0-31.9, adult: Secondary | ICD-10-CM | POA: Diagnosis not present

## 2018-11-20 DIAGNOSIS — G588 Other specified mononeuropathies: Secondary | ICD-10-CM | POA: Diagnosis not present

## 2018-11-20 DIAGNOSIS — R0782 Intercostal pain: Secondary | ICD-10-CM | POA: Diagnosis not present

## 2018-11-20 DIAGNOSIS — S22080A Wedge compression fracture of T11-T12 vertebra, initial encounter for closed fracture: Secondary | ICD-10-CM | POA: Diagnosis not present

## 2018-12-09 DIAGNOSIS — M810 Age-related osteoporosis without current pathological fracture: Secondary | ICD-10-CM | POA: Diagnosis not present

## 2018-12-09 DIAGNOSIS — M8589 Other specified disorders of bone density and structure, multiple sites: Secondary | ICD-10-CM | POA: Diagnosis not present

## 2018-12-09 DIAGNOSIS — M8588 Other specified disorders of bone density and structure, other site: Secondary | ICD-10-CM | POA: Diagnosis not present

## 2018-12-17 DIAGNOSIS — M818 Other osteoporosis without current pathological fracture: Secondary | ICD-10-CM

## 2018-12-17 HISTORY — DX: Other osteoporosis without current pathological fracture: M81.8

## 2018-12-30 ENCOUNTER — Telehealth: Payer: Self-pay | Admitting: *Deleted

## 2018-12-30 ENCOUNTER — Ambulatory Visit: Payer: PPO | Admitting: Sports Medicine

## 2018-12-30 ENCOUNTER — Ambulatory Visit (INDEPENDENT_AMBULATORY_CARE_PROVIDER_SITE_OTHER): Payer: PPO

## 2018-12-30 ENCOUNTER — Other Ambulatory Visit: Payer: Self-pay | Admitting: Sports Medicine

## 2018-12-30 ENCOUNTER — Other Ambulatory Visit: Payer: Self-pay

## 2018-12-30 ENCOUNTER — Encounter: Payer: Self-pay | Admitting: Sports Medicine

## 2018-12-30 DIAGNOSIS — M79672 Pain in left foot: Secondary | ICD-10-CM | POA: Diagnosis not present

## 2018-12-30 DIAGNOSIS — M722 Plantar fascial fibromatosis: Secondary | ICD-10-CM

## 2018-12-30 DIAGNOSIS — I739 Peripheral vascular disease, unspecified: Secondary | ICD-10-CM | POA: Diagnosis not present

## 2018-12-30 DIAGNOSIS — M629 Disorder of muscle, unspecified: Secondary | ICD-10-CM

## 2018-12-30 MED ORDER — HYDROCODONE-ACETAMINOPHEN 10-325 MG PO TABS: 1.0000 | ORAL_TABLET | Freq: Four times a day (QID) | ORAL | 0 refills | Status: AC | PRN

## 2018-12-30 NOTE — Progress Notes (Signed)
Subjective: [name] is a 63 y.o. female patient who presents to office for evaluation of left arch pain patient reports that for the last 3 weeks she has had pain off and on in the arch foot over the last 2 weeks after feeling a snap been feeling constant pain reports that she was walking for exercise and felt a snap in her arch and experienced some swelling does not recall any bruising but states that the area has tingle and burn everything has been using power step orthotics but they cause pressure and some pain there has been icing elevating taking anti-inflammatories and hydrocodone which she had leftover from her back surgery and reports that she is still having pain 9-10 out of 10.  Patient denies injury or trauma or complaints at this time.  Patient Active Problem List   Diagnosis Date Noted  . Edema 03/30/2018  . Prediabetes 03/30/2018  . Atherosclerosis of both carotid arteries 01/01/2018  . Thyroid nodule 01/01/2018  . Laryngopharyngeal reflux (LPR) 11/19/2017  . Hormone replacement therapy 10/28/2017  . Chronic cough 10/08/2017  . Dysphonia 10/08/2017  . Pulmonary nodule 09/09/2017  . LPRD (laryngopharyngeal reflux disease) 04/18/2017  . Asthma, not well controlled, mild persistent, with acute exacerbation 04/18/2017  . Inflammatory dermatosis 04/18/2017  . Other allergic rhinitis 04/18/2017  . Bilateral knee pain 03/26/2017  . Bilateral shoulder pain 03/26/2017  . Fibromyalgia 12/02/2016  . Hypothyroidism (acquired) 07/02/2016  . Essential hypertension 05/20/2016  . Localized edema 05/20/2016  . Exertional shortness of breath 11/22/2015  . Near syncope 11/22/2015  . Anxiety 08/14/2015  . Degeneration of lumbar intervertebral disc 04/05/2015  . GERD (gastroesophageal reflux disease) 04/05/2015  . Recurrent major depressive disorder, in remission ([place]) 04/05/2015  . Restless legs syndrome 04/05/2015  . Vitamin B12 deficiency 04/05/2015  . Vitamin D deficiency  disease 04/05/2015  . Chronic sinusitis 04/04/2015  . Malaise and fatigue 04/04/2015  . Mixed hyperlipidemia 04/04/2015  . Osteoarthritis of multiple joints 04/04/2015  . Osteopenia 04/04/2015  . OTH&UNSPEC SUP INJURY SHLDR&UPPER ARM INF 08/23/2008  . CONSTIPATION, HX OF 08/23/2008  . ARTHROSCOPY, HX OF 08/23/2008  . CHOLECYSTECTOMY, HX OF 08/23/2008  . OTHER POSTSURGICAL STATUS OTHER 08/23/2008  . ACQUIRED ABSENCE OF BOTH CERVIX AND UTERUS 08/23/2008  . History of arthrodesis 08/23/2008  . UNSPEC LOCAL INFECTION SKIN&SUBCUTANEOUS TISSUE 08/06/2008    Current Outpatient Medications on File Prior to Visit  Medication Sig Dispense Refill  . acetaminophen (TYLENOL) 500 MG tablet Take 500-1,000 mg by mouth every 6 (six) hours as needed (pain.).    Marland Kitchen cyclobenzaprine (FLEXERIL) 10 MG tablet Take 1 tablet (10 mg total) by mouth 3 (three) times daily as needed for muscle spasms. 90 tablet 3  . diclofenac (VOLTAREN) 75 MG EC tablet Take 75 mg by mouth 2 (two) times daily as needed (pain.).     Marland Kitchen DULoxetine (CYMBALTA) 60 MG capsule Take 60 mg by mouth daily.     Marland Kitchen estradiol (ESTRACE) 0.5 MG tablet Take 0.25 mg by mouth daily.     . fluticasone (FLONASE) 50 MCG/ACT nasal spray Place 1 spray into both nostrils daily as needed for allergies.    . Fluticasone Furoate (ARNUITY ELLIPTA) 200 MCG/ACT AEPB Inhale 1 puff into the lungs daily. (Patient not taking: Reported on 08/24/2018) 30 each 5  . furosemide (LASIX) 40 MG tablet Take 1 tablet (40 mg total) by mouth daily. (Patient not taking: Reported on 08/24/2018) 30 tablet 0  . levothyroxine (SYNTHROID) 50 MCG tablet Take  50 mcg by mouth daily before breakfast.    . pantoprazole (PROTONIX) 40 MG tablet TAKE 1 TABLET BY MOUTH ONCE DAILY (Patient taking differently: Take 40 mg by mouth daily. ) 30 tablet 0  . phentermine 37.5 MG capsule Take 37.5 mg by mouth daily before breakfast.     . rOPINIRole (REQUIP) 2 MG tablet Take 2-3 mg by mouth at bedtime.      No current facility-administered medications on file prior to visit.     Allergies  Allergen Reactions  . Gabapentin Swelling    From 06/21/2013 OV: Pt thinks it affects her speech some "more thick tongued".  . Celecoxib Itching  . Clonazepam Other (See Comments)    syncope  . Losartan Other (See Comments)  . Mobic [Meloxicam] Itching  . Naproxen Sodium Itching    Objective:  General: Alert and oriented x3 in no acute distress  Dermatology: No open lesions bilateral lower extremities, no webspace macerations, no ecchymosis bilateral, all nails x 10 are well manicured.  Vascular: Dorsalis Pedis and Posterior Tibial pedal pulses palpable, Capillary Fill Time 3 seconds,(+) pedal hair growth bilateral, no edema bilateral lower extremities, Temperature gradient within normal limits.  Neurology: Johney Maine sensation intact via light touch bilateral.  Musculoskeletal: Moderate tenderness to palpation to the mid arch on the left foot there is no weakness or major deformity there is mild varicosities noted to the area with petechiae possible bruise versus chronic venous skin changes.  Pain is present with weightbearing left arch.  Gait: Antalgic gait  Xrays  Left foot   Impression: Normal osseous mineralization.  Calcaneal heel spur no fracture or dislocation no other abnormalities noted  Assessment and Plan: Problem List Items Addressed This Visit    None    Visit Diagnoses    Nontraumatic tear of plantar fascia    -  Primary   Relevant Medications   HYDROcodone-acetaminophen (NORCO) 10-325 MG tablet   Plantar fasciitis, left       PVD (peripheral vascular disease) (HCC)       Pain of left heel          -Complete examination performed -Xrays reviewed -Discussed treatement options for likely nontraumatic talus plantar fascial -Rx ultrasound for further evaluation for tear -Dispensed postoperative shoe to use as instructed daily -Applied plantar fascial strapping for patient to  keep clean dry and intact for the next 5 days after 5 days may remove and continue with postoperative shoe -Prescribed Norco to take for severe episodes of pain and recommend rest ice and elevation -Patient to return to office after ultrasound or sooner if condition worsens.  Landis Martins, DPM

## 2018-12-30 NOTE — Telephone Encounter (Signed)
[facility] Kathrin Ruddy scheduled [phone] [place] left plantar foot 01/11/2019 arrive at 11:00am for 11:30am testing. Faxed to [facility].

## 2018-12-30 NOTE — Telephone Encounter (Signed)
lelft message informing pt of [facility] [phone] appt 01/11/2019.

## 2018-12-30 NOTE — Patient Instructions (Signed)

## 2018-12-30 NOTE — Telephone Encounter (Signed)
-----   Message from Landis Martins, [place] sent at 12/30/2018 10:41 AM EST ----- Regarding: [facility] Ultrasound Eval for tear in arch at plantar fascia Felt a pop after walking with constant pain

## 2019-01-04 DIAGNOSIS — I10 Essential (primary) hypertension: Secondary | ICD-10-CM | POA: Diagnosis not present

## 2019-01-04 DIAGNOSIS — E039 Hypothyroidism, unspecified: Secondary | ICD-10-CM | POA: Diagnosis not present

## 2019-01-04 DIAGNOSIS — M818 Other osteoporosis without current pathological fracture: Secondary | ICD-10-CM | POA: Diagnosis not present

## 2019-02-25 DIAGNOSIS — J479 Bronchiectasis, uncomplicated: Secondary | ICD-10-CM

## 2019-02-25 HISTORY — DX: Bronchiectasis, uncomplicated: J47.9

## 2019-03-02 DIAGNOSIS — R768 Other specified abnormal immunological findings in serum: Secondary | ICD-10-CM | POA: Insufficient documentation

## 2019-03-02 HISTORY — DX: Other specified abnormal immunological findings in serum: R76.8

## 2019-06-18 DIAGNOSIS — R030 Elevated blood-pressure reading, without diagnosis of hypertension: Secondary | ICD-10-CM

## 2019-06-18 HISTORY — DX: Elevated blood-pressure reading, without diagnosis of hypertension: R03.0

## 2019-06-21 ENCOUNTER — Other Ambulatory Visit: Payer: Self-pay | Admitting: Neurological Surgery

## 2019-07-07 NOTE — Progress Notes (Signed)
[place], [place] [id number] [place] [id number] Phone: [phone] Fax: [phone]      Your procedure is scheduled on Monday 07/12/2019.  Report to [facility] Main Entrance "A" at 06:00 A.M., and check in at the Admitting office.  Call this number if you have problems the morning of surgery:  [phone]  Call [phone] if you have any questions prior to your surgery date Monday-Friday 8am-4pm    Remember:  Do not eat or drink after midnight the night before your surgery   Take these medicines the morning of surgery with A SIP OF WATER: Acetaminophen (Tylenol) - if needed Fluticasone (Flonase) - if needed Levothyroxine (Synthroid) Pantoprazole (Protonix) Pregabalin (Lyrica)  As of today, STOP taking any Aspirin (unless otherwise instructed by your surgeon) and Aspirin containing products, Aleve, Naproxen, Ibuprofen, Motrin, Advil, Goody's, BC's, all herbal medications, fish oil, and all vitamins.                      Do not wear jewelry, make up, or nail polish            Do not wear lotions, powders, perfumes, or deodorant.            Do not shave 48 hours prior to surgery.             Do not bring valuables to the hospital.            [facility] is not responsible for any belongings or valuables.  Do NOT Smoke (Tobacco/Vapping) or drink Alcohol 24 hours prior to your procedure  If you use a CPAP at night, you may bring all equipment for your overnight stay.   Contacts, glasses, dentures or bridgework may not be worn into surgery.      For patients admitted to the hospital, discharge time will be determined by your treatment team.   Patients discharged the day of surgery will not be allowed to drive home, and someone needs to stay with them for 24 hours.    Special instructions:   [place]- Preparing For Surgery  Before surgery, you can play an important role. Because skin is not sterile, your skin  needs to be as free of germs as possible. You can reduce the number of germs on your skin by washing with CHG (chlorahexidine gluconate) Soap before surgery.  CHG is an antiseptic cleaner which kills germs and bonds with the skin to continue killing germs even after washing.    Oral Hygiene is also important to reduce your risk of infection.  Remember - BRUSH YOUR TEETH THE MORNING OF SURGERY WITH YOUR REGULAR TOOTHPASTE  Please do not use if you have an allergy to CHG or antibacterial soaps. If your skin becomes reddened/irritated stop using the CHG.  Do not shave (including legs and underarms) for at least 48 hours prior to first CHG shower. It is OK to shave your face.  Please follow these instructions carefully.   1. Shower the NIGHT BEFORE SURGERY and the MORNING OF SURGERY with CHG Soap.   2. If you chose to wash your hair, wash your hair first as usual with your normal shampoo.  3. After you shampoo, rinse your hair and body thoroughly to remove the shampoo.  4. Use CHG as you would any other liquid soap. You can apply CHG directly to the skin and wash gently with a scrungie or a clean washcloth.  5. Apply the CHG Soap to your body ONLY FROM THE NECK DOWN.  Do not use on open wounds or open sores. Avoid contact with your eyes, ears, mouth and genitals (private parts). Wash Face and genitals (private parts)  with your normal soap.   6. Wash thoroughly, paying special attention to the area where your surgery will be performed.  7. Thoroughly rinse your body with warm water from the neck down.  8. DO NOT shower/wash with your normal soap after using and rinsing off the CHG Soap.  9. Pat yourself dry with a CLEAN TOWEL.  10. Wear CLEAN PAJAMAS to bed the night before surgery, wear comfortable clothes the morning of surgery  11. Place CLEAN SHEETS on your bed the night of your first shower and DO NOT SLEEP WITH PETS.   Day of Surgery:   Do not apply any deodorants/lotions.   Please wear clean clothes to the hospital/surgery center.   Remember to brush your teeth WITH YOUR REGULAR TOOTHPASTE.   Please read over the following fact sheets that you were given.

## 2019-07-08 ENCOUNTER — Encounter (HOSPITAL_COMMUNITY): Payer: Self-pay

## 2019-07-08 ENCOUNTER — Encounter (HOSPITAL_COMMUNITY)
Admission: RE | Admit: 2019-07-08 | Discharge: 2019-07-08 | Disposition: A | Discharge status: Home / Self Care | Payer: Medicare HMO | Source: Ambulatory Visit | Attending: Neurological Surgery | Admitting: Neurological Surgery

## 2019-07-08 ENCOUNTER — Other Ambulatory Visit: Payer: Self-pay

## 2019-07-08 ENCOUNTER — Other Ambulatory Visit (HOSPITAL_COMMUNITY)
Admission: RE | Admit: 2019-07-08 | Discharge: 2019-07-08 | Disposition: A | Discharge status: Home / Self Care | Payer: Medicare HMO | Source: Ambulatory Visit | Attending: Neurological Surgery | Admitting: Neurological Surgery

## 2019-07-08 DIAGNOSIS — Z01818 Encounter for other preprocedural examination: Secondary | ICD-10-CM | POA: Insufficient documentation

## 2019-07-08 DIAGNOSIS — Z20822 Contact with and (suspected) exposure to covid-19: Secondary | ICD-10-CM | POA: Diagnosis not present

## 2019-07-08 LAB — BASIC METABOLIC PANEL
Anion gap: 8 (ref 5–15)
BUN: 20 mg/dL (ref 8–23)
CO2: 24 mmol/L (ref 22–32)
Calcium: 9 mg/dL (ref 8.9–10.3)
Chloride: 107 mmol/L (ref 98–111)
Creatinine, Ser: 0.76 mg/dL (ref 0.44–1.00)
GFR calc Af Amer: >60 mL/min (ref >60)
GFR calc non Af Amer: >60 mL/min (ref >60)
Glucose, Bld: 95 mg/dL (ref 70–99)
Potassium: 4.2 mmol/L (ref 3.5–5.1)
Sodium: 139 mmol/L (ref 135–145)

## 2019-07-08 LAB — TYPE AND SCREEN
ABO/RH(D): O POS
Antibody Screen: NEGATIVE

## 2019-07-08 LAB — CBC
HCT: 43.4 % (ref 36.0–46.0)
Hemoglobin: 14.3 g/dL (ref 12.0–15.0)
MCH: 28.8 pg (ref 26.0–34.0)
MCHC: 32.9 g/dL (ref 30.0–36.0)
MCV: 87.5 fL (ref 80.0–100.0)
Platelets: 306 10*3/uL (ref 150–400)
RBC: 4.96 MIL/uL (ref 3.87–5.11)
RDW: 12.9 % (ref 11.5–15.5)
WBC: 6.6 10*3/uL (ref 4.0–10.5)
nRBC: 0 % (ref 0.0–0.2)

## 2019-07-08 LAB — SURGICAL PCR SCREEN
MRSA, PCR: NEGATIVE
Staphylococcus aureus: POSITIVE — AB

## 2019-07-08 LAB — ABO/RH: ABO/RH(D): O POS

## 2019-07-08 LAB — SARS CORONAVIRUS 2 (TAT 6-24 HRS): SARS Coronavirus 2: NEGATIVE

## 2019-07-08 NOTE — Progress Notes (Signed)
PCP Kathy Graff, MD Cardiologist - pt states she used to see Dr. Agustin Cree, but has not seen him in years  Documents from previous visits were requested    Chest x-ray - n/a EKG - 07/08/19 Stress Test - pt denies, looks like in care everywhere she was scheduled for one, but do not see anything about her actually having one done, documents requested ECHO - 10/04/2015 documents requested Cardiac Cath - pt denies  Sleep Study - n/a CPAP -     Blood Thinner Instructions: n/a Aspirin Instructions: n/a  ERAS Protcol - n/a PRE-SURGERY Ensure or G2- n/a  COVID TEST- 07/08/19   Anesthesia review: yes - documents requested from Dr. Bea [name] and Dr. Agustin Cree  Patient denies shortness of breath, fever, cough and chest pain at PAT appointment   All instructions explained to the patient, with a verbal understanding of the material. Patient agrees to go over the instructions while at home for a better understanding. Patient also instructed to self quarantine after being tested for COVID-19. The opportunity to ask questions was provided.

## 2019-07-09 NOTE — Progress Notes (Addendum)
Anesthesia Chart Review:  Previous cardiac eval for dizziness/presyncope.  In 2012 she had a stress echo that was negative for ischemia.  In October 2017 she was seen by Dr. Agustin Cree for fatigue and he ordered an updated echo.  The results are not available in care everywhere but per his note 11/21/2025, it showed a preserved left ventricular ejection fraction.  This result has been requested, however, at the time this test was done Dr. Agustin Cree worked for [facility] physicians which was subsequently acquired by [facility] health and records from that time are often difficult to get.  Records have been requested from both [facility] and [facility].  Followed closely by PCP Dr. Bea Graff for management of chronic medical problems. Last seen 04/01/19. Overall stable. She was referred to psychiatry for continued management of depression and fibromyalgia.   Preop labs reviewed, WNL.  EKG 07/08/2019: NSR.  Rate 83.  Spirometry 04/18/2017: FVC          2.22 69% predicted FEV1        1.94 78% predicted FEV1R     0.87 112% predicted PE F        4.78 77% predicted FEF25-75 2.88 124% predicted Preinterpretation: Mild restriction. Post interpretation: Mild restriction.  No significant bronchodilator response.   CHEST - 2 VIEW 04/19/2018:  COMPARISON:  March 12, 2018  FINDINGS: There is no appreciable edema or consolidation. The heart size and pulmonary vascularity are normal. No adenopathy. There is postoperative change in the cervical spine. There is postoperative change in the right shoulder. There is evidence of old trauma involving each lateral clavicle.  IMPRESSION: No edema or consolidation.  Stable cardiac silhouette.   CT chest 03/01/2016: Impression: No active cardiopulmonary disease.  Bibasilar bronchiolectasis.  Bibasilar subsegmental atelectasis.  Post cholecystectomy.  Stress echo 01/03/2011: Resting echo.  Normal LV function with no regional wall motion  abnormality. Exercise echo: Normal contractile reserve with no regional wall motion abnormality.  Conclusions: Quality of study: Poor but interpretable. Fair exercise capacity. Poor ECG quality but ECG in recovery did not show any diagnostic changes. Negative stress echo.   Wynonia Musty [facility] Short Stay Center/Anesthesiology Phone [phone] 07/09/2019 3:44 PM

## 2019-07-09 NOTE — Anesthesia Preprocedure Evaluation (Addendum)
Anesthesia Evaluation  Patient identified by MRN, date of birth, ID band Patient awake    Reviewed: Allergy & Precautions, NPO status , Patient's Chart, lab work & pertinent test results  Airway Mallampati: II  TM Distance: >3 FB Neck ROM: Full    Dental  (+) Dental Advisory Given   Pulmonary asthma ,    breath sounds clear to auscultation       Cardiovascular hypertension, Pt. on medications  Rhythm:Regular Rate:Normal     Neuro/Psych  Neuromuscular disease    GI/Hepatic Neg liver ROS, GERD  ,  Endo/Other  Hypothyroidism   Renal/GU negative Renal ROS     Musculoskeletal  (+) Arthritis , Fibromyalgia -  Abdominal   Peds  Hematology negative hematology ROS (+)   Anesthesia Other Findings   Reproductive/Obstetrics                            Anesthesia Physical Anesthesia Plan  ASA: II  Anesthesia Plan: General   Post-op Pain Management:    Induction: Intravenous  PONV Risk Score and Plan: 3 and Dexamethasone, Ondansetron and Treatment may vary due to age or medical condition  Airway Management Planned: Oral ETT  Additional Equipment: None  Intra-op Plan:   Post-operative Plan: Extubation in OR  Informed Consent: I have reviewed the patients History and Physical, chart, labs and discussed the procedure including the risks, benefits and alternatives for the proposed anesthesia with the patient or authorized representative who has indicated his/her understanding and acceptance.     Dental advisory given  Plan Discussed with: CRNA  Anesthesia Plan Comments:       Anesthesia Quick Evaluation

## 2019-07-12 ENCOUNTER — Inpatient Hospital Stay (HOSPITAL_COMMUNITY): Payer: Medicare HMO

## 2019-07-12 ENCOUNTER — Inpatient Hospital Stay (HOSPITAL_COMMUNITY): Payer: Medicare HMO | Admitting: Vascular Surgery

## 2019-07-12 ENCOUNTER — Encounter (HOSPITAL_COMMUNITY): Payer: Self-pay | Admitting: Neurological Surgery

## 2019-07-12 ENCOUNTER — Other Ambulatory Visit: Payer: Self-pay

## 2019-07-12 ENCOUNTER — Inpatient Hospital Stay (HOSPITAL_COMMUNITY): Payer: Medicare HMO | Admitting: Anesthesiology

## 2019-07-12 ENCOUNTER — Inpatient Hospital Stay (HOSPITAL_COMMUNITY)
Admission: RE | Admit: 2019-07-12 | Discharge: 2019-07-14 | DRG: 460 | Disposition: A | Discharge status: Home / Self Care | Payer: Medicare HMO | Attending: Neurological Surgery | Admitting: Neurological Surgery

## 2019-07-12 ENCOUNTER — Encounter (HOSPITAL_COMMUNITY)
Admission: RE | Disposition: A | Discharge status: Home / Self Care | Payer: Self-pay | Source: Home / Self Care | Attending: Neurological Surgery

## 2019-07-12 DIAGNOSIS — Z8249 Family history of ischemic heart disease and other diseases of the circulatory system: Secondary | ICD-10-CM

## 2019-07-12 DIAGNOSIS — K219 Gastro-esophageal reflux disease without esophagitis: Secondary | ICD-10-CM | POA: Diagnosis present

## 2019-07-12 DIAGNOSIS — Z833 Family history of diabetes mellitus: Secondary | ICD-10-CM | POA: Diagnosis not present

## 2019-07-12 DIAGNOSIS — M1990 Unspecified osteoarthritis, unspecified site: Secondary | ICD-10-CM | POA: Diagnosis present

## 2019-07-12 DIAGNOSIS — M5416 Radiculopathy, lumbar region: Secondary | ICD-10-CM | POA: Diagnosis present

## 2019-07-12 DIAGNOSIS — R7303 Prediabetes: Secondary | ICD-10-CM | POA: Diagnosis present

## 2019-07-12 DIAGNOSIS — Z419 Encounter for procedure for purposes other than remedying health state, unspecified: Secondary | ICD-10-CM

## 2019-07-12 DIAGNOSIS — M48061 Spinal stenosis, lumbar region without neurogenic claudication: Principal | ICD-10-CM | POA: Diagnosis present

## 2019-07-12 DIAGNOSIS — M797 Fibromyalgia: Secondary | ICD-10-CM | POA: Diagnosis present

## 2019-07-12 DIAGNOSIS — I10 Essential (primary) hypertension: Secondary | ICD-10-CM | POA: Diagnosis present

## 2019-07-12 HISTORY — PX: TRANSFORAMINAL LUMBAR INTERBODY FUSION W/ MIS 1 LEVEL: SHX6145

## 2019-07-12 HISTORY — DX: Radiculopathy, lumbar region: M54.16

## 2019-07-12 SURGERY — MINIMALLY INVASIVE (MIS) TRANSFORAMINAL LUMBAR INTERBODY FUSION (TLIF) 1 LEVEL
Anesthesia: General | Laterality: Left

## 2019-07-12 MED ORDER — CYCLOBENZAPRINE HCL 10 MG PO TABS
ORAL_TABLET | ORAL | Status: AC
  Filled 2019-07-12: qty 1

## 2019-07-12 MED ORDER — SUGAMMADEX SODIUM 200 MG/2ML IV SOLN
INTRAVENOUS | Status: DC | PRN
  Administered 2019-07-12: 200 mg via INTRAVENOUS

## 2019-07-12 MED ORDER — 0.9 % SODIUM CHLORIDE (POUR BTL) OPTIME
TOPICAL | Status: DC | PRN
  Administered 2019-07-12: 1000 mL

## 2019-07-12 MED ORDER — GLYCOPYRROLATE PF 0.2 MG/ML IJ SOSY
PREFILLED_SYRINGE | INTRAMUSCULAR | Status: AC
  Filled 2019-07-12: qty 1

## 2019-07-12 MED ORDER — ROCURONIUM BROMIDE 10 MG/ML (PF) SYRINGE
PREFILLED_SYRINGE | INTRAVENOUS | Status: DC | PRN
  Administered 2019-07-12: 20 mg via INTRAVENOUS
  Administered 2019-07-12: 50 mg via INTRAVENOUS
  Administered 2019-07-12: 10 mg via INTRAVENOUS
  Administered 2019-07-12: 20 mg via INTRAVENOUS

## 2019-07-12 MED ORDER — THROMBIN 5000 UNITS EX SOLR
CUTANEOUS | Status: AC
  Filled 2019-07-12: qty 5000

## 2019-07-12 MED ORDER — LEVOTHYROXINE SODIUM 25 MCG PO TABS
50.0000 ug | ORAL_TABLET | Freq: Every day | ORAL | Status: DC
  Administered 2019-07-13 – 2019-07-14 (×2): 50 ug via ORAL
  Filled 2019-07-12 (×2): qty 2

## 2019-07-12 MED ORDER — SODIUM CHLORIDE 0.9 % IV SOLN: 250.0000 mL | INTRAVENOUS | Status: DC

## 2019-07-12 MED ORDER — DEXAMETHASONE SODIUM PHOSPHATE 10 MG/ML IJ SOLN
INTRAMUSCULAR | Status: DC | PRN
  Administered 2019-07-12: 10 mg via INTRAVENOUS

## 2019-07-12 MED ORDER — LIDOCAINE 2% (20 MG/ML) 5 ML SYRINGE
INTRAMUSCULAR | Status: AC
  Filled 2019-07-12: qty 5

## 2019-07-12 MED ORDER — CYCLOBENZAPRINE HCL 10 MG PO TABS
10.0000 mg | ORAL_TABLET | Freq: Three times a day (TID) | ORAL | Status: DC | PRN
  Administered 2019-07-12: 10 mg via ORAL

## 2019-07-12 MED ORDER — ROPINIROLE HCL 1 MG PO TABS
2.0000 mg | ORAL_TABLET | Freq: Every day | ORAL | Status: DC
  Administered 2019-07-12 – 2019-07-13 (×2): 2 mg via ORAL
  Filled 2019-07-12 (×3): qty 2

## 2019-07-12 MED ORDER — MIDAZOLAM HCL 2 MG/2ML IJ SOLN
INTRAMUSCULAR | Status: AC
  Filled 2019-07-12: qty 2

## 2019-07-12 MED ORDER — PHENYLEPHRINE 40 MCG/ML (10ML) SYRINGE FOR IV PUSH (FOR BLOOD PRESSURE SUPPORT)
PREFILLED_SYRINGE | INTRAVENOUS | Status: AC
  Filled 2019-07-12: qty 10

## 2019-07-12 MED ORDER — ROCURONIUM BROMIDE 10 MG/ML (PF) SYRINGE
PREFILLED_SYRINGE | INTRAVENOUS | Status: AC
  Filled 2019-07-12: qty 10

## 2019-07-12 MED ORDER — PROPOFOL 10 MG/ML IV BOLUS
INTRAVENOUS | Status: AC
  Filled 2019-07-12: qty 20

## 2019-07-12 MED ORDER — FENTANYL CITRATE (PF) 100 MCG/2ML IJ SOLN
INTRAMUSCULAR | Status: DC | PRN
  Administered 2019-07-12: 100 ug via INTRAVENOUS
  Administered 2019-07-12: 50 ug via INTRAVENOUS
  Administered 2019-07-12: 100 ug via INTRAVENOUS
  Administered 2019-07-12 (×2): 50 ug via INTRAVENOUS
  Administered 2019-07-12: 100 ug via INTRAVENOUS
  Administered 2019-07-12: 50 ug via INTRAVENOUS

## 2019-07-12 MED ORDER — LACTATED RINGERS IV SOLN: INTRAVENOUS | Status: DC | PRN

## 2019-07-12 MED ORDER — CEFAZOLIN SODIUM 1 G IJ SOLR
INTRAMUSCULAR | Status: AC
  Filled 2019-07-12: qty 20

## 2019-07-12 MED ORDER — ONDANSETRON HCL 4 MG PO TABS: 4.0000 mg | ORAL_TABLET | Freq: Four times a day (QID) | ORAL | Status: DC | PRN

## 2019-07-12 MED ORDER — FENTANYL CITRATE (PF) 100 MCG/2ML IJ SOLN
INTRAMUSCULAR | Status: AC
  Filled 2019-07-12: qty 2

## 2019-07-12 MED ORDER — ORAL CARE MOUTH RINSE: 15.0000 mL | Freq: Once | OROMUCOSAL | Status: AC

## 2019-07-12 MED ORDER — CEFAZOLIN SODIUM-DEXTROSE 2-4 GM/100ML-% IV SOLN
2.0000 g | Freq: Three times a day (TID) | INTRAVENOUS | Status: AC
  Administered 2019-07-12 – 2019-07-13 (×2): 2 g via INTRAVENOUS
  Filled 2019-07-12 (×2): qty 100

## 2019-07-12 MED ORDER — ONDANSETRON HCL 4 MG/2ML IJ SOLN: 4.0000 mg | Freq: Once | INTRAMUSCULAR | Status: DC | PRN

## 2019-07-12 MED ORDER — CHLORHEXIDINE GLUCONATE CLOTH 2 % EX PADS: 6.0000 | MEDICATED_PAD | Freq: Once | CUTANEOUS | Status: DC

## 2019-07-12 MED ORDER — PANTOPRAZOLE SODIUM 40 MG PO TBEC
40.0000 mg | DELAYED_RELEASE_TABLET | Freq: Every day | ORAL | Status: DC
  Administered 2019-07-12 – 2019-07-13 (×2): 40 mg via ORAL
  Filled 2019-07-12 (×2): qty 1

## 2019-07-12 MED ORDER — SODIUM CHLORIDE 0.9 % IV SOLN
INTRAVENOUS | Status: DC | PRN
  Administered 2019-07-12: 500 mL

## 2019-07-12 MED ORDER — ACETAMINOPHEN 325 MG PO TABS
650.0000 mg | ORAL_TABLET | ORAL | Status: DC | PRN
  Administered 2019-07-13 – 2019-07-14 (×3): 650 mg via ORAL
  Filled 2019-07-12 (×3): qty 2

## 2019-07-12 MED ORDER — MIDAZOLAM HCL 5 MG/5ML IJ SOLN
INTRAMUSCULAR | Status: DC | PRN
  Administered 2019-07-12: 2 mg via INTRAVENOUS

## 2019-07-12 MED ORDER — CHLORHEXIDINE GLUCONATE 0.12 % MT SOLN: 15.0000 mL | Freq: Once | OROMUCOSAL | Status: DC

## 2019-07-12 MED ORDER — GLYCOPYRROLATE PF 0.2 MG/ML IJ SOSY
PREFILLED_SYRINGE | INTRAMUSCULAR | Status: DC | PRN
  Administered 2019-07-12: .1 mg via INTRAVENOUS

## 2019-07-12 MED ORDER — ONDANSETRON HCL 4 MG/2ML IJ SOLN: 4.0000 mg | Freq: Four times a day (QID) | INTRAMUSCULAR | Status: DC | PRN

## 2019-07-12 MED ORDER — LIDOCAINE-EPINEPHRINE 1 %-1:100000 IJ SOLN
INTRAMUSCULAR | Status: DC | PRN
  Administered 2019-07-12: 10 mL

## 2019-07-12 MED ORDER — LACTATED RINGERS IV SOLN: INTRAVENOUS | Status: DC

## 2019-07-12 MED ORDER — DOCUSATE SODIUM 100 MG PO CAPS
100.0000 mg | ORAL_CAPSULE | Freq: Two times a day (BID) | ORAL | Status: DC
  Administered 2019-07-12 – 2019-07-13 (×3): 100 mg via ORAL
  Filled 2019-07-12 (×3): qty 1

## 2019-07-12 MED ORDER — FENTANYL CITRATE (PF) 250 MCG/5ML IJ SOLN
INTRAMUSCULAR | Status: AC
  Filled 2019-07-12: qty 5

## 2019-07-12 MED ORDER — THROMBIN 5000 UNITS EX SOLR
OROMUCOSAL | Status: DC | PRN
  Administered 2019-07-12: 5 mL via TOPICAL

## 2019-07-12 MED ORDER — CHLORHEXIDINE GLUCONATE 0.12 % MT SOLN
15.0000 mL | Freq: Once | OROMUCOSAL | Status: AC
  Administered 2019-07-12: 15 mL via OROMUCOSAL
  Filled 2019-07-12: qty 15

## 2019-07-12 MED ORDER — CEFAZOLIN SODIUM-DEXTROSE 2-4 GM/100ML-% IV SOLN
2.0000 g | INTRAVENOUS | Status: AC
  Administered 2019-07-12 (×2): 2 g via INTRAVENOUS
  Filled 2019-07-12: qty 100

## 2019-07-12 MED ORDER — ONDANSETRON HCL 4 MG/2ML IJ SOLN
INTRAMUSCULAR | Status: DC | PRN
  Administered 2019-07-12: 4 mg via INTRAVENOUS

## 2019-07-12 MED ORDER — ACETAMINOPHEN 650 MG RE SUPP: 650.0000 mg | RECTAL | Status: DC | PRN

## 2019-07-12 MED ORDER — PROPOFOL 10 MG/ML IV BOLUS
INTRAVENOUS | Status: DC | PRN
  Administered 2019-07-12: 150 mg via INTRAVENOUS
  Administered 2019-07-12: 50 mg via INTRAVENOUS

## 2019-07-12 MED ORDER — ACETAMINOPHEN 500 MG PO TABS
1000.0000 mg | ORAL_TABLET | Freq: Once | ORAL | Status: AC
  Administered 2019-07-12: 1000 mg via ORAL
  Filled 2019-07-12: qty 2

## 2019-07-12 MED ORDER — ONDANSETRON HCL 4 MG/2ML IJ SOLN
INTRAMUSCULAR | Status: AC
  Filled 2019-07-12: qty 2

## 2019-07-12 MED ORDER — LIDOCAINE 2% (20 MG/ML) 5 ML SYRINGE
INTRAMUSCULAR | Status: DC | PRN
  Administered 2019-07-12: 60 mg via INTRAVENOUS

## 2019-07-12 MED ORDER — HYDROMORPHONE HCL 1 MG/ML IJ SOLN
1.0000 mg | INTRAMUSCULAR | Status: DC | PRN
  Administered 2019-07-12: 1 mg via INTRAVENOUS
  Filled 2019-07-12: qty 1

## 2019-07-12 MED ORDER — LORATADINE 10 MG PO TABS
10.0000 mg | ORAL_TABLET | Freq: Every day | ORAL | Status: DC
  Administered 2019-07-12 – 2019-07-13 (×2): 10 mg via ORAL
  Filled 2019-07-12 (×2): qty 1

## 2019-07-12 MED ORDER — SODIUM CHLORIDE 0.9% FLUSH
3.0000 mL | Freq: Two times a day (BID) | INTRAVENOUS | Status: DC
  Administered 2019-07-12: 3 mL via INTRAVENOUS

## 2019-07-12 MED ORDER — OXYCODONE HCL 5 MG PO TABS
ORAL_TABLET | ORAL | Status: AC
  Filled 2019-07-12: qty 1

## 2019-07-12 MED ORDER — LIDOCAINE-EPINEPHRINE 1 %-1:100000 IJ SOLN
INTRAMUSCULAR | Status: AC
  Filled 2019-07-12: qty 1

## 2019-07-12 MED ORDER — POLYETHYLENE GLYCOL 3350 17 G PO PACK: 17.0000 g | PACK | Freq: Every day | ORAL | Status: DC | PRN

## 2019-07-12 MED ORDER — PHENYLEPHRINE HCL-NACL 10-0.9 MG/250ML-% IV SOLN
INTRAVENOUS | Status: DC | PRN
  Administered 2019-07-12: 60 ug/min via INTRAVENOUS

## 2019-07-12 MED ORDER — FENTANYL CITRATE (PF) 100 MCG/2ML IJ SOLN
25.0000 ug | INTRAMUSCULAR | Status: DC | PRN
  Administered 2019-07-12 (×4): 25 ug via INTRAVENOUS

## 2019-07-12 MED ORDER — PREGABALIN 75 MG PO CAPS
75.0000 mg | ORAL_CAPSULE | Freq: Two times a day (BID) | ORAL | Status: DC
  Administered 2019-07-12 – 2019-07-13 (×4): 75 mg via ORAL
  Filled 2019-07-12 (×4): qty 1

## 2019-07-12 MED ORDER — SODIUM CHLORIDE 0.9% FLUSH: 3.0000 mL | INTRAVENOUS | Status: DC | PRN

## 2019-07-12 MED ORDER — TRAZODONE HCL 50 MG PO TABS
50.0000 mg | ORAL_TABLET | Freq: Every day | ORAL | Status: DC | PRN
  Filled 2019-07-12: qty 2

## 2019-07-12 MED ORDER — DEXAMETHASONE SODIUM PHOSPHATE 10 MG/ML IJ SOLN
INTRAMUSCULAR | Status: AC
  Filled 2019-07-12: qty 1

## 2019-07-12 MED ORDER — MENTHOL 3 MG MT LOZG: 1.0000 | LOZENGE | OROMUCOSAL | Status: DC | PRN

## 2019-07-12 MED ORDER — OXYCODONE HCL 5 MG PO TABS
10.0000 mg | ORAL_TABLET | ORAL | Status: DC | PRN
  Administered 2019-07-12 – 2019-07-14 (×11): 10 mg via ORAL
  Filled 2019-07-12 (×11): qty 2

## 2019-07-12 MED ORDER — SERTRALINE HCL 50 MG PO TABS
75.0000 mg | ORAL_TABLET | Freq: Every day | ORAL | Status: DC
  Administered 2019-07-12 – 2019-07-13 (×2): 75 mg via ORAL
  Filled 2019-07-12 (×2): qty 2

## 2019-07-12 MED ORDER — ORAL CARE MOUTH RINSE: 15.0000 mL | Freq: Once | OROMUCOSAL | Status: DC

## 2019-07-12 MED ORDER — OXYCODONE HCL 5 MG PO TABS
5.0000 mg | ORAL_TABLET | ORAL | Status: DC | PRN
  Administered 2019-07-12: 5 mg via ORAL

## 2019-07-12 MED ORDER — PHENYLEPHRINE 40 MCG/ML (10ML) SYRINGE FOR IV PUSH (FOR BLOOD PRESSURE SUPPORT)
PREFILLED_SYRINGE | INTRAVENOUS | Status: DC | PRN
  Administered 2019-07-12 (×3): 80 ug via INTRAVENOUS

## 2019-07-12 MED ORDER — FLUTICASONE PROPIONATE 50 MCG/ACT NA SUSP
1.0000 | Freq: Every day | NASAL | Status: DC | PRN
  Filled 2019-07-12: qty 16

## 2019-07-12 MED ORDER — PHENOL 1.4 % MT LIQD: 1.0000 | OROMUCOSAL | Status: DC | PRN

## 2019-07-12 SURGICAL SUPPLY — 64 items
BAG DECANTER FOR FLEXI CONT (MISCELLANEOUS) ×3 IMPLANT
BAND RUBBER #18 3X1/16 STRL (MISCELLANEOUS) ×4 IMPLANT
BASKET BONE COLLECTION (BASKET) ×2 IMPLANT
BLADE CLIPPER SURG (BLADE) IMPLANT
BLADE SURG 11 STRL SS (BLADE) ×3 IMPLANT
BUR MATCHSTICK NEURO 3.0 LAGG (BURR) ×2 IMPLANT
BUR PRECISION FLUTE 5.0 (BURR) ×2 IMPLANT
CNTNR URN SCR LID CUP LEK RST (MISCELLANEOUS) ×1 IMPLANT
CONT SPEC 4OZ STRL OR WHT (MISCELLANEOUS) ×3
COVER BACK TABLE 60X90IN (DRAPES) ×3 IMPLANT
COVER WAND RF STERILE (DRAPES) ×1 IMPLANT
DECANTER SPIKE VIAL GLASS SM (MISCELLANEOUS) ×3 IMPLANT
DERMABOND ADVANCED (GAUZE/BANDAGES/DRESSINGS) ×2
DERMABOND ADVANCED .7 DNX12 (GAUZE/BANDAGES/DRESSINGS) ×1 IMPLANT
DEVICE INTERBODY ELEVATE 23X7 (Cage) ×2 IMPLANT
DRAPE C-ARM 42X72 X-RAY (DRAPES) ×3 IMPLANT
DRAPE LAPAROTOMY 100X72X124 (DRAPES) ×3 IMPLANT
DRAPE MICROSCOPE LEICA (MISCELLANEOUS) ×2 IMPLANT
DRAPE SURG 17X23 STRL (DRAPES) ×6 IMPLANT
ELECT BLADE 6.5 EXT (BLADE) ×3 IMPLANT
ELECT REM PT RETURN 9FT ADLT (ELECTROSURGICAL) ×3
ELECTRODE REM PT RTRN 9FT ADLT (ELECTROSURGICAL) ×1 IMPLANT
EXTENDER TAB GUIDE SV 5.5/6.0 (INSTRUMENTS) ×16 IMPLANT
GAUZE 4X4 16PLY RFD (DISPOSABLE) IMPLANT
GAUZE SPONGE 4X4 12PLY STRL (GAUZE/BANDAGES/DRESSINGS) ×3 IMPLANT
GLOVE BIO SURGEON STRL SZ7.5 (GLOVE) ×3 IMPLANT
GLOVE BIOGEL PI IND STRL 7.5 (GLOVE) ×1 IMPLANT
GLOVE BIOGEL PI INDICATOR 7.5 (GLOVE) ×2
GLOVE EXAM NITRILE LRG STRL (GLOVE) IMPLANT
GLOVE EXAM NITRILE XL STR (GLOVE) IMPLANT
GLOVE EXAM NITRILE XS STR PU (GLOVE) IMPLANT
GOWN STRL REUS W/ TWL LRG LVL3 (GOWN DISPOSABLE) ×1 IMPLANT
GOWN STRL REUS W/ TWL XL LVL3 (GOWN DISPOSABLE) IMPLANT
GOWN STRL REUS W/TWL 2XL LVL3 (GOWN DISPOSABLE) ×3 IMPLANT
GOWN STRL REUS W/TWL LRG LVL3 (GOWN DISPOSABLE) ×3
GOWN STRL REUS W/TWL XL LVL3 (GOWN DISPOSABLE)
GUIDEWIRE BLUNT NT 450 (WIRE) ×8 IMPLANT
HEMOSTAT POWDER KIT SURGIFOAM (HEMOSTASIS) ×3 IMPLANT
KIT BASIN OR (CUSTOM PROCEDURE TRAY) ×3 IMPLANT
KIT POSITION SURG JACKSON T1 (MISCELLANEOUS) ×3 IMPLANT
KIT TURNOVER KIT B (KITS) ×2 IMPLANT
NDL BEVEL TWO-PAK W/1PK (NEEDLE) IMPLANT
NDL HYPO 18GX1.5 BLUNT FILL (NEEDLE) IMPLANT
NDL SPNL 18GX3.5 QUINCKE PK (NEEDLE) IMPLANT
NEEDLE BEVEL TWO-PAK W/1PK (NEEDLE) ×3 IMPLANT
NEEDLE HYPO 18GX1.5 BLUNT FILL (NEEDLE) IMPLANT
NEEDLE HYPO 22GX1.5 SAFETY (NEEDLE) ×3 IMPLANT
NEEDLE SPNL 18GX3.5 QUINCKE PK (NEEDLE) IMPLANT
NS IRRIG 1000ML POUR BTL (IV SOLUTION) ×3 IMPLANT
PACK LAMINECTOMY NEURO (CUSTOM PROCEDURE TRAY) ×3 IMPLANT
PAD ARMBOARD 7.5X6 YLW CONV (MISCELLANEOUS) ×6 IMPLANT
ROD 5.5 CCM PERC 40 (Rod) ×4 IMPLANT
SCREW 6.5X35 VOYAGER MAS FNS (Screw) ×8 IMPLANT
SCREW SET 5.5/6.0MM SOLERA (Screw) ×8 IMPLANT
SPONGE LAP 4X18 RFD (DISPOSABLE) IMPLANT
SUT MNCRL AB 3-0 PS2 18 (SUTURE) ×3 IMPLANT
SUT VIC AB 0 CT1 18XCR BRD8 (SUTURE) IMPLANT
SUT VIC AB 0 CT1 8-18 (SUTURE)
SUT VIC AB 2-0 CP2 18 (SUTURE) ×5 IMPLANT
SYR 3ML LL SCALE MARK (SYRINGE) IMPLANT
TOWEL GREEN STERILE (TOWEL DISPOSABLE) ×3 IMPLANT
TOWEL GREEN STERILE FF (TOWEL DISPOSABLE) ×3 IMPLANT
TRAY FOLEY MTR SLVR 16FR STAT (SET/KITS/TRAYS/PACK) ×2 IMPLANT
WATER STERILE IRR 1000ML POUR (IV SOLUTION) ×3 IMPLANT

## 2019-07-12 NOTE — Op Note (Signed)
PATIENT: Kathy [name]  DAY OF SURGERY: 07/12/19   PRE-OPERATIVE DIAGNOSIS:  Lumbar radiculopathy   POST-OPERATIVE DIAGNOSIS:  Lumbar radiculopathy   PROCEDURE:  Left L5-S1 minimally invasive transforaminal lumbar interbody fusion with bilateral L5-S1 pedicle screw placement   SURGEON:  Surgeon(s) and Role:    Judith Part, MD - Primary    Consuella Lose, MD - Assisting   ANESTHESIA: ETGA   BRIEF HISTORY: This is a 64 year old woman who presented with bilateral left greater than right L5 radiculopathy and worsening low back pain. The patient was found to have severe left L5-S1 foraminal stenosis. This was discussed with the patient as well as risks, benefits, and alternatives and wished to proceed with surgery.   OPERATIVE DETAIL:  The patient was taken to the operating room and placed on the OR table in the prone position. A formal time out was performed with two patient identifiers and confirmed the operative site. Anesthesia was induced by the anesthesia team. The operative site was marked, hair was clipped with surgical clippers, the area was then prepped and draped in a sterile fashion.   Fluoroscopy was used to localize the surgical level. The pedicles were marked and used to create skin incisions bilaterally. With fluoro guidance, Jamshidi needles were used to guide K-wires into the bilateral L5 and S1 pedicles. The K wires were then secured with hemostats and attention turned to the TLIF.  A [employer] tube was then docked to the left L5-S1 facet through the same incision using fluoroscopy. A left L5-S1 facetectomy was performed and the left L5 nerve root was decompressed along its entire course. The tube was wanded medially and the decompression was continued medially until reaching the contralateral foramen to complete the L5-S1 laminectomy and contralateral foraminotomy. The tube was wanded back to the disc space. The disc space was identified, incised, and a discectomy was  performed in the standard fashion. The endplates were prepped, bone graft was packed into the disc space, and an expandable cage ([employer]tr[employer]) was packed with autograft and placed into the disc space with fluoroscopic confirmation. The tube was removed and hemostasis was obtained during its removal.   Using the previously placed K wires, a tap and then screw with tower were placed bilaterally at L5 and S1. A rod was sized and introduced on both sides, confirmed with fluoroscopy, then final tightened. Hemostasis was again confirmed for both incisions, they were copiously irrigated, and then closed in layers.    EBL:  144mL   DRAINS: none   SPECIMENS: none   Judith Part, MD 07/12/19 8:15 AM

## 2019-07-12 NOTE — Progress Notes (Signed)
Orthopedic Tech Progress Note Patient Details:  [name] 1955/05/13 [id number] RN called requesting for an LSO. So I called in order to [facility] Patient ID: Kathy [name], female   DOB: 02-Mar-1955, 64 y.o.   MRN: [id number]   Janit Pagan 07/12/2019, 3:02 PM

## 2019-07-12 NOTE — Brief Op Note (Signed)
07/12/2019  1:05 PM  PATIENT:  Kathy [name]  64 y.o. female  PRE-OPERATIVE DIAGNOSIS:  Lumbar radiculopathy  POST-OPERATIVE DIAGNOSIS:  Lumbar radiculopathy  PROCEDURE:  Procedure(s) with comments: Left Lumbar Five- Sacral One minimally invasive transforaminal lumbar interbody fusion (Left) - Left Lumbar Five- Sacral One minimally invasive transforaminal lumbar interbody fusion  SURGEON:  Surgeon(s) and Role:    * , Joyice Faster, MD - Primary    * Consuella Lose, MD - Assisting  PHYSICIAN ASSISTANT:   ANESTHESIA:   general  EBL:  100 mL   BLOOD ADMINISTERED:none  DRAINS: none   LOCAL MEDICATIONS USED:  LIDOCAINE   SPECIMEN:  No Specimen  DISPOSITION OF SPECIMEN:  N/A  COUNTS:  YES  TOURNIQUET:  * No tourniquets in log *  DICTATION: .Note written in EPIC  PLAN OF CARE: Admit to inpatient   PATIENT DISPOSITION:  PACU - hemodynamically stable.   Delay start of Pharmacological VTE agent (>24hrs) due to surgical blood loss or risk of bleeding: yes

## 2019-07-12 NOTE — Transfer of Care (Signed)
Immediate Anesthesia Transfer of Care Note  Patient: Kathy [name]  Procedure(s) Performed: Left Lumbar Five- Sacral One minimally invasive transforaminal lumbar interbody fusion (Left )  Patient Location: PACU  Anesthesia Type:General  Level of Consciousness: awake  Airway & Oxygen Therapy: Patient Spontanous Breathing and Patient connected to face mask oxygen  Post-op Assessment: Report given to RN, Post -op Vital signs reviewed and stable and Patient moving all extremities X 4  Post vital signs: Reviewed and stable  Last Vitals:  Vitals Value Taken Time  BP 152/102 07/12/19 1302  Temp    Pulse 91 07/12/19 1304  Resp 24 07/12/19 1304  SpO2 99 % 07/12/19 1304  Vitals shown include unvalidated device data.  Last Pain:  Vitals:   07/12/19 0733  TempSrc:   PainSc: 8       Patients Stated Pain Goal: 2 (40/37/54 3606)  Complications: No apparent anesthesia complications

## 2019-07-12 NOTE — Anesthesia Procedure Notes (Signed)
Procedure Name: Intubation Date/Time: 07/12/2019 8:32 AM Performed by: Orlie Dakin, CRNA Pre-anesthesia Checklist: Emergency Drugs available, Patient identified, Suction available and Patient being monitored Patient Re-evaluated:Patient Re-evaluated prior to induction Oxygen Delivery Method: Circle system utilized Preoxygenation: Pre-oxygenation with 100% oxygen Induction Type: IV induction Ventilation: Oral airway inserted - appropriate to patient size Laryngoscope Size: Miller and 3 Grade View: Grade I Tube type: Oral Tube size: 7.0 mm Number of attempts: 1 Airway Equipment and Method: Stylet Placement Confirmation: ETT inserted through vocal cords under direct vision,  positive ETCO2 and breath sounds checked- equal and bilateral Secured at: 22 cm Tube secured with: Tape Dental Injury: Teeth and Oropharynx as per pre-operative assessment  Comments: 4x4s bite block

## 2019-07-12 NOTE — H&P (Signed)
Surgical H&P Update  HPI: 64 y.o. woman with a history of back and bilateral left greater than right radicular pain and numbness, here for left L5-S1 TLIF. No changes in health since she was last seen. Still having radicular and back pain, pt wishes to proceed with surgery.  PMHx:  Past Medical History:  Diagnosis Date  . Arthritis   . Constipation   . Depression   . GERD (gastroesophageal reflux disease)   . Heart murmur    in the past, they cant hear it no  . Hypertension   . Nocturia   . Pre-diabetes   . Shortness of breath    with exertion   FamHx:  Family History  Problem Relation Age of Onset  . Heart disease Mother   . Diabetes Mother   . Heart disease Father   . Diabetes Sister   . High blood pressure Brother   . Colon cancer Maternal Aunt   . Heart disease Paternal Uncle   . Heart disease Maternal Grandfather   . Heart disease Paternal Grandmother   . Heart disease Paternal Grandfather   . High blood pressure Brother   . Heart disease Brother   . Stroke Brother   . Diabetes Brother    SocHx:  reports that she has never smoked. She has never used smokeless tobacco. She reports current alcohol use. She reports that she does not use drugs.  Physical Exam: AOx3, PERRL, FS, TM  Strength 5/5 x4, SILTx4 except for bilateral L5 distribution numbness  Assesment/Plan: 64 y.o. woman with bilateral L>R L5 radiculopathy due to foraminal stenosis, here for left L5-S1 MIS TLIF. Risks, benefits, and alternatives discussed and the patient would like to continue with surgery.  -OR today -3C post-op  Judith Part, MD 07/12/19 8:13 AM

## 2019-07-13 ENCOUNTER — Encounter: Payer: Self-pay | Admitting: *Deleted

## 2019-07-13 NOTE — Evaluation (Signed)
Physical Therapy Evaluation Patient Details Name: Kathy [name] MRN: [id number] DOB: 12-03-1955 Today's Date: 07/13/2019   History of Present Illness  Pt is a 64 y/o female now s/p Left L5-S1 minimally invasive transforaminal lumbar interbody fusion with bilateral L5-S1 pedicle screw placement. PMHx includes depression, HTN, hx of cervical fusion (2014).   Clinical Impression  Pt admitted with above diagnosis. At the time of PT eval, pt was able to demonstrate transfers and ambulation with gross supervision for safety to occasional min guard assist and RW for support. Pt was educated on precautions, brace application/wearing schedule, appropriate activity progression, and car transfer. Pt currently with functional limitations due to the deficits listed below (see PT Problem List). Pt will benefit from skilled PT to increase their independence and safety with mobility to allow discharge to the venue listed below.      Follow Up Recommendations No PT follow up;Supervision for mobility/OOB    Equipment Recommendations  Rolling walker with 5" wheels;3in1 (PT)    Recommendations for Other Services       Precautions / Restrictions Precautions Precautions: Fall;Back Precaution Booklet Issued: Yes (comment) Precaution Comments: verbally reviewed during session, pt able to recall 2/3 precautions without cues  Required Braces or Orthoses: Spinal Brace Restrictions Weight Bearing Restrictions: No      Mobility  Bed Mobility Overal bed mobility: Needs Assistance Bed Mobility: Sidelying to Sit   Sidelying to sit: Supervision     Sit to sidelying: Supervision General bed mobility comments: Pt received in sidelying. Pt was able to transition to EOB with supervision for safety.   Transfers Overall transfer level: Needs assistance Equipment used: Rolling walker (2 wheeled) Transfers: Sit to/from Stand Sit to Stand: Min guard Stand pivot transfers: Min guard       General transfer  comment: for balance and safety  Ambulation/Gait Ambulation/Gait assistance: Min guard;Supervision Gait Distance (Feet): 300 Feet Assistive device: Rolling walker (2 wheeled) Gait Pattern/deviations: Step-through pattern;Decreased stride length;Trunk flexed Gait velocity: Decreased Gait velocity interpretation: <1.8 ft/sec, indicate of risk for recurrent falls General Gait Details: VC's for improved posture and general safety with RW. Min guard assist progressing to supervision for safety.   Stairs            Wheelchair Mobility    Modified Rankin (Stroke Patients Only)       Balance Overall balance assessment: Needs assistance Sitting-balance support: Feet supported Sitting balance-Leahy Scale: Fair     Standing balance support: Bilateral upper extremity supported Standing balance-Leahy Scale: Fair                               Pertinent Vitals/Pain Pain Assessment: Faces Faces Pain Scale: Hurts little more Pain Location: back, incisional Pain Descriptors / Indicators: Discomfort;Guarding Pain Intervention(s): Limited activity within patient's tolerance;Monitored during session;Repositioned    Home Living Family/patient expects to be discharged to:: Private residence Living Arrangements: Alone Available Help at Discharge: Family;Available PRN/intermittently Type of Home: House         Home Equipment: None      Prior Function Level of Independence: Independent         Comments: reports has two cats at home      Hand Dominance        Extremity/Trunk Assessment   Upper Extremity Assessment Upper Extremity Assessment: Defer to OT evaluation    Lower Extremity Assessment Lower Extremity Assessment: Generalized weakness    Cervical / Trunk Assessment Cervical /  Trunk Assessment: Other exceptions Cervical / Trunk Exceptions: s/p lumbar sx  Communication   Communication: No difficulties  Cognition Arousal/Alertness:  Awake/alert;Lethargic Behavior During Therapy: WFL for tasks assessed/performed Overall Cognitive Status: Within Functional Limits for tasks assessed                                 General Comments: pt "jittery" this session and notably sleepy, occasionally nodding off during session and reports concerns with remembering education provided given her sleepiness (reassured that education provided is also on her handout)       General Comments      Exercises     Assessment/Plan    PT Assessment Patient needs continued PT services  PT Problem List Decreased strength;Decreased activity tolerance;Decreased balance;Decreased mobility;Decreased knowledge of use of DME;Decreased safety awareness;Decreased knowledge of precautions;Pain       PT Treatment Interventions DME instruction;Stair training;Gait training;Functional mobility training;Therapeutic activities;Therapeutic exercise;Neuromuscular re-education;Patient/family education    PT Goals (Current goals can be found in the Care Plan section)  Acute Rehab PT Goals Patient Stated Goal: home when able PT Goal Formulation: With patient    Frequency Min 5X/week   Barriers to discharge        Co-evaluation               AM-PAC PT "6 Clicks" Mobility  Outcome Measure Help needed turning from your back to your side while in a flat bed without using bedrails?: None Help needed moving from lying on your back to sitting on the side of a flat bed without using bedrails?: None Help needed moving to and from a bed to a chair (including a wheelchair)?: A Little Help needed standing up from a chair using your arms (e.g., wheelchair or bedside chair)?: A Little Help needed to walk in hospital room?: A Little Help needed climbing 3-5 steps with a railing? : A Little 6 Click Score: 20    End of Session Equipment Utilized During Treatment: Gait belt Activity Tolerance: Patient tolerated treatment well Patient left: in  chair;with call bell/phone within reach Nurse Communication: Mobility status PT Visit Diagnosis: Unsteadiness on feet (R26.81);Pain Pain - part of body: (back)    Time: 6384-5364 PT Time Calculation (min) (ACUTE ONLY): 20 min   Charges:   PT Evaluation $PT Eval Low Complexity: 1 Low          Kathy [name], PT, DPT Acute Rehabilitation Services Pager: [phone] Office: [phone]   Kathy [name] 07/13/2019, 12:52 PM

## 2019-07-13 NOTE — Anesthesia Postprocedure Evaluation (Signed)
Anesthesia Post Note  Patient: Kathy [name]  Procedure(s) Performed: Left Lumbar Five- Sacral One minimally invasive transforaminal lumbar interbody fusion (Left )     Patient location during evaluation: PACU Anesthesia Type: General Level of consciousness: awake and alert Pain management: pain level controlled Vital Signs Assessment: post-procedure vital signs reviewed and stable Respiratory status: spontaneous breathing, nonlabored ventilation, respiratory function stable and patient connected to nasal cannula oxygen Cardiovascular status: blood pressure returned to baseline and stable Postop Assessment: no apparent nausea or vomiting Anesthetic complications: no    Last Vitals:  Vitals:   07/13/19 0804 07/13/19 1216  BP: (!) 109/57 (!) 145/80  Pulse: (!) 115 (!) 114  Resp: 16 16  Temp: 36.8 C 36.6 C  SpO2: 95% 93%    Last Pain:  Vitals:   07/13/19 1216  TempSrc: Oral  PainSc:                  [name]

## 2019-07-13 NOTE — Discharge Summary (Signed)
Discharge Summary  Date of Admission: 07/12/2019  Date of Discharge: 07/14/2019  Attending Physician: Emelda Brothers, MD  Hospital Course: Patient was admitted following an uncomplicated J0-K9 MIS TLIF. She was recovered in PACU and transferred to [facility]. Her hospital course was uncomplicated and the patient was discharged home on 07/13/2019. She will follow up in clinic with me in 2 weeks.  Neurologic exam at discharge:  Strength 5/5 x4, SILTx4 except for non-dermatomal left leg numbness  Discharge diagnosis: Lumbar radiculopathy  Judith Part, MD 07/13/19 10:50 AM

## 2019-07-13 NOTE — Evaluation (Addendum)
Occupational Therapy Evaluation Patient Details Name: Kathy [name] MRN: [id number] DOB: 1955/06/05 Today's Date: 07/13/2019    History of Present Illness Pt is a 64 y/o female now s/p Left L5-S1 minimally invasive transforaminal lumbar interbody fusion with bilateral L5-S1 pedicle screw placement. PMHx includes depression, HTN, hx of cervical fusion (2014).    Clinical Impression   This 64 y/o female presents with the above. PTA pt reports being independent with ADL and functional mobility, lives alone. Pt with intermittent lethargy this session and reports feeling "jittery", though pleasant/willing to participate in therapy session, sitting up in recliner upon arrival. Pt currently requiring minguard assist for functional transfers using RW, up to minA for LB ADL (with AE) and setup assist for UB ADL. Additional focus on review/further education re: back precautions, brace management, AE, safety and compensatory techniques for completing ADL and functional transfers. Pt verbalizing and/or return demonstrating understanding throughout. Pt reports will have intermittent assist from family/friends after discharge. She will benefit from continued acute OT services and currently feel she will benefit from additional [facility] services given current functional status and only intermittent assist at d/c (vs 24hr). OT to follow.     Follow Up Recommendations  Home health OT;Supervision - Intermittent    Equipment Recommendations  3 in 1 bedside commode           Precautions / Restrictions Precautions Precautions: Fall;Back Precaution Booklet Issued: Yes (comment) Precaution Comments: verbally reviewed during session, pt able to recall 2/3 precautions without cues  Required Braces or Orthoses: Spinal Brace Restrictions Weight Bearing Restrictions: No      Mobility Bed Mobility Overal bed mobility: Needs Assistance Bed Mobility: Sit to Sidelying         Sit to sidelying:  Supervision General bed mobility comments: use of bedrail, education/cues for log roll technique, pt remaining in sidelying end of session  Transfers Overall transfer level: Needs assistance Equipment used: Rolling walker (2 wheeled) Transfers: Sit to/from [facility] Sit to Stand: Min guard Stand pivot transfers: Min guard       General transfer comment: for balance and safety, closer guarding given pt with intermittent lethargy and feeling "jittery" today, no instability noted with transitions     Balance Overall balance assessment: Needs assistance Sitting-balance support: Feet supported Sitting balance-Leahy Scale: Fair     Standing balance support: Bilateral upper extremity supported Standing balance-Leahy Scale: Fair                             ADL either performed or assessed with clinical judgement   ADL Overall ADL's : Needs assistance/impaired Eating/Feeding: Modified independent;Sitting   Grooming: Set up;Sitting   Upper Body Bathing: Min guard;Sitting   Lower Body Bathing: Min guard;Sit to/from stand   Upper Body Dressing : Min guard;Sitting Upper Body Dressing Details (indicate cue type and reason): pt doffing lumbar brace with supervision Lower Body Dressing: Minimal assistance;Sitting/lateral leans;Sit to/from stand Lower Body Dressing Details (indicate cue type and reason): requires external assist without use of AE, educated in use of reacher and long handled shoe horn for LB dressing tasks (reports typically doesn't wear socks) Toilet Transfer: Min guard;Stand-pivot;Ambulation;RW Toilet Transfer Details (indicate cue type and reason): simulated via transfer to [place] and Hygiene: Min guard;Minimal assistance;Sitting/lateral lean;Sit to/from Nurse, children's Details (indicate cue type and reason): educated to initially sponge bathe after return home given she lives alone and with safety  concerns stepping into tub, recommend pt have her sister or someone assist with placing 3:1 in shower (for use as shower seat) during bathing task  Functional mobility during ADLs: Min guard;Rolling walker General ADL Comments: close minguard for mobility tasks given some lethargy      Vision         Perception     Praxis      Pertinent Vitals/Pain Pain Assessment: Faces Faces Pain Scale: Hurts little more Pain Location: back, incisional Pain Descriptors / Indicators: Discomfort;Guarding Pain Intervention(s): Limited activity within patient's tolerance;Monitored during session;Repositioned     Hand Dominance     Extremity/Trunk Assessment Upper Extremity Assessment Upper Extremity Assessment: Generalized weakness(suspect increased due to being "jittery")   Lower Extremity Assessment Lower Extremity Assessment: Defer to PT evaluation   Cervical / Trunk Assessment Cervical / Trunk Assessment: Other exceptions Cervical / Trunk Exceptions: s/p lumbar sx   Communication Communication Communication: No difficulties   Cognition Arousal/Alertness: Awake/alert;Lethargic Behavior During Therapy: WFL for tasks assessed/performed Overall Cognitive Status: Within Functional Limits for tasks assessed                                 General Comments: pt "jittery" this session and notably sleepy, occasionally nodding off during session and reports concerns with remembering education provided given her sleepiness (reassured that education provided is also on her handout)    General Comments       Exercises     Shoulder Instructions      Home Living Family/patient expects to be discharged to:: Private residence Living Arrangements: Alone Available Help at Discharge: Family;Available PRN/intermittently Type of Home: House             Bathroom Shower/Tub: Teacher, early years/pre: Standard     Home Equipment: None          Prior  Functioning/Environment Level of Independence: Independent        Comments: reports has two cats at home         OT Problem List: Decreased strength;Decreased range of motion;Decreased activity tolerance;Impaired balance (sitting and/or standing);Decreased knowledge of use of DME or AE;Decreased knowledge of precautions;Pain      OT Treatment/Interventions: Self-care/ADL training;Therapeutic exercise;Energy conservation;DME and/or AE instruction;Therapeutic activities;Patient/family education;Balance training    OT Goals(Current goals can be found in the care plan section) Acute Rehab OT Goals Patient Stated Goal: home when able OT Goal Formulation: With patient Time For Goal Achievement: 07/27/19 Potential to Achieve Goals: Good  OT Frequency: Min 2X/week   Barriers to D/C:            Co-evaluation              AM-PAC OT "6 Clicks" Daily Activity     Outcome Measure Help from another person eating meals?: None Help from another person taking care of personal grooming?: A Little Help from another person toileting, which includes using toliet, bedpan, or urinal?: A Little Help from another person bathing (including washing, rinsing, drying)?: A Little Help from another person to put on and taking off regular upper body clothing?: A Little Help from another person to put on and taking off regular lower body clothing?: A Little 6 Click Score: 19   End of Session Equipment Utilized During Treatment: Rolling walker;Back brace Nurse Communication: Mobility status  Activity Tolerance: Patient tolerated treatment well Patient left: in bed;with call bell/phone within reach  OT Visit Diagnosis:  Other abnormalities of gait and mobility (R26.89);Pain Pain - part of body: (back)                Time: 0404-5913 OT Time Calculation (min): 22 min Charges:  OT General Charges $OT Visit: 1 Visit OT Evaluation $OT Eval Low Complexity: [place], OT Acute  Rehabilitation Services Pager [phone] Office [phone]   Raymondo Band 07/13/2019, 9:57 AM

## 2019-07-13 NOTE — Discharge Instructions (Addendum)
Discharge Instructions   slowly increase your activity back to normal.   Your incision is closed with dermabond (purple glue). This will naturally fall off over the next 1-2 weeks.   Okay to shower on the day of discharge. Use regular soap and water and try to be gentle when cleaning your incision.   Follow up with Dr. Zada Finders in 2 weeks after discharge. If you do not already have a discharge appointment, please call his office at [phone] to schedule a follow up appointment. If you have any concerns or questions, please call the office and let us know.

## 2019-07-13 NOTE — Progress Notes (Signed)
Neurosurgery Service Progress Note  Subjective: No acute events overnight, preop leg pain resolved, expected back pain, LLE numbness worse than preop but no weakness   Objective: Vitals:   07/12/19 2039 07/12/19 2345 07/13/19 0331 07/13/19 0804  BP: 133/71 (!) 158/71 (!) 127/54 (!) 109/57  Pulse: 97 (!) 108 (!) 105 (!) 115  Resp: 16 20 20 16   Temp: 98.4 F (36.9 C) 98 F (36.7 C) 98.3 F (36.8 C) 98.2 F (36.8 C)  TempSrc: Oral Oral Oral Oral  SpO2: 93% 95% 94% 95%  Weight:      Height:       Temp (24hrs), Avg:97.9 F (36.6 C), Min:97 F (36.1 C), Max:98.4 F (36.9 C)  CBC Latest Ref Rng & Units 07/08/2019 08/27/2018 04/19/2018  WBC 4.0 - 10.5 K/uL 6.6 6.7 8.5  Hemoglobin 12.0 - 15.0 g/dL 14.3 13.4 12.4  Hematocrit 36.0 - 46.0 % 43.4 42.8 38.8  Platelets 150 - 400 K/uL 306 285 329   BMP Latest Ref Rng & Units 07/08/2019 08/27/2018 04/19/2018  Glucose 70 - 99 mg/dL 95 104(H) 91  BUN 8 - 23 mg/dL 20 16 14   Creatinine 0.44 - 1.00 mg/dL 0.76 0.79 0.94  Sodium 135 - 145 mmol/L 139 139 138  Potassium 3.5 - 5.1 mmol/L 4.2 3.4(L) 3.6  Chloride 98 - 111 mmol/L 107 106 105  CO2 22 - 32 mmol/L 24 23 25   Calcium 8.9 - 10.3 mg/dL 9.0 8.8(L) 8.8(L)    Intake/Output Summary (Last 24 hours) at 07/13/2019 1048 Last data filed at 07/12/2019 2126 Gross per 24 hour  Intake 1603 ml  Output 450 ml  Net 1153 ml    Current Facility-Administered Medications:    0.9 %  sodium chloride infusion, 250 mL, Intravenous, Continuous, , Joyice Faster, MD   acetaminophen (TYLENOL) tablet 650 mg, 650 mg, Oral, Q4H PRN **OR** acetaminophen (TYLENOL) suppository 650 mg, 650 mg, Rectal, Q4H PRN, Judith Part, MD   docusate sodium (COLACE) capsule 100 mg, 100 mg, Oral, BID, , Joyice Faster, MD, 100 mg at 07/13/19 0913   fluticasone (FLONASE) 50 MCG/ACT nasal spray 1 spray, 1 spray, Each Nare, Daily PRN, Judith Part, MD   HYDROmorphone (DILAUDID) injection 1 mg, 1 mg, Intravenous, Q3H PRN,  Judith Part, MD, 1 mg at 07/12/19 1901   levothyroxine (SYNTHROID) tablet 50 mcg, 50 mcg, Oral, QAC breakfast, Judith Part, MD, 50 mcg at 07/13/19 0513   loratadine (CLARITIN) tablet 10 mg, 10 mg, Oral, Daily, , Joyice Faster, MD, 10 mg at 07/13/19 0913   menthol-cetylpyridinium (CEPACOL) lozenge 3 mg, 1 lozenge, Oral, PRN **OR** phenol (CHLORASEPTIC) mouth spray 1 spray, 1 spray, Mouth/Throat, PRN, , Joyice Faster, MD   ondansetron (ZOFRAN) tablet 4 mg, 4 mg, Oral, Q6H PRN **OR** ondansetron (ZOFRAN) injection 4 mg, 4 mg, Intravenous, Q6H PRN, Judith Part, MD   oxyCODONE (Oxy IR/ROXICODONE) immediate release tablet 10 mg, 10 mg, Oral, Q4H PRN, Judith Part, MD, 10 mg at 07/13/19 0913   pantoprazole (PROTONIX) EC tablet 40 mg, 40 mg, Oral, Daily, ,  A, MD, 40 mg at 07/13/19 0913   polyethylene glycol (MIRALAX / GLYCOLAX) packet 17 g, 17 g, Oral, Daily PRN, Judith Part, MD   pregabalin (LYRICA) capsule 75 mg, 75 mg, Oral, BID, Judith Part, MD, 75 mg at 07/13/19 0913   rOPINIRole (REQUIP) tablet 2 mg, 2 mg, Oral, QHS, , Joyice Faster, MD, 2 mg at 07/12/19 2122   sertraline (ZOLOFT) tablet 75  mg, 75 mg, Oral, QHS, , Joyice Faster, MD, 75 mg at 07/12/19 2122   sodium chloride flush (NS) 0.9 % injection 3 mL, 3 mL, Intravenous, Q12H, ,  A, MD, 3 mL at 07/12/19 2126   sodium chloride flush (NS) 0.9 % injection 3 mL, 3 mL, Intravenous, PRN, Judith Part, MD   traZODone (DESYREL) tablet 50-100 mg, 50-100 mg, Oral, Daily PRN, Judith Part, MD   Physical Exam: Strength 5/5 x4, SILTx4 except LLE numbness worst in L5/S1 distribution Incisions c/d/i  Assessment & Plan: 64 y.o. woman s/p L5-S1 MIS TLIF, recovering well.  -numbness likely 2/2 intra-op manipulation, full strength, should resolve -will keep inpatient tonight to make sure she continues to improve, likely discharge tomorrow  Judith Part  07/13/19 10:48 AM

## 2019-07-14 MED ORDER — OXYCODONE-ACETAMINOPHEN 5-325 MG PO TABS: 1.0000 | ORAL_TABLET | ORAL | 0 refills | Status: AC | PRN

## 2019-07-14 MED ORDER — CYCLOBENZAPRINE HCL 10 MG PO TABS
10.0000 mg | ORAL_TABLET | Freq: Three times a day (TID) | ORAL | Status: DC | PRN
  Administered 2019-07-14: 10 mg via ORAL
  Filled 2019-07-14: qty 1

## 2019-07-14 NOTE — Progress Notes (Signed)
Physical Therapy Treatment Patient Details Name: Kathy [name] MRN: [id number] DOB: July 27, 1955 Today's Date: 07/14/2019    History of Present Illness Pt is a 64 y/o female now s/p Left L5-S1 minimally invasive transforaminal lumbar interbody fusion with bilateral L5-S1 pedicle screw placement. PMHx includes depression, HTN, hx of cervical fusion (2014).     PT Comments    Pt progressing well with post-op mobility. She was able to demonstrate transfers and ambulation with gross supervision for safety to min guard assist with the RW. She was very sleepy/lethargic this session and required lights on and several multimodal cues to wake up enough to begin session. Once up, pt still reports being sleepy but was able to participate fully in the session. Pt was educated on precautions, brace application/wearing schedule, appropriate activity progression, and car transfer. Will continue to follow.      Follow Up Recommendations  No PT follow up;Supervision for mobility/OOB     Equipment Recommendations  Rolling walker with 5" wheels;3in1 (PT)    Recommendations for Other Services       Precautions / Restrictions Precautions Precautions: Fall;Back Precaution Booklet Issued: Yes (comment) Precaution Comments: verbally reviewed during session, pt able to recall 2/3 precautions without cues  Required Braces or Orthoses: Spinal Brace Restrictions Weight Bearing Restrictions: No    Mobility  Bed Mobility Overal bed mobility: Needs Assistance Bed Mobility: Sidelying to Sit;Sit to Sidelying   Sidelying to sit: Supervision     Sit to sidelying: Supervision General bed mobility comments: Pt received in sidelying. Pt was able to transition to EOB with supervision for safety and back to sidelying at end of session. PT assisted with placing pillow between knees.   Transfers Overall transfer level: Needs assistance Equipment used: Rolling walker (2 wheeled) Transfers: Sit to/from Stand Sit  to Stand: Min guard         General transfer comment: for balance and safety  Ambulation/Gait Ambulation/Gait assistance: Min guard Gait Distance (Feet): 200 Feet Assistive device: Rolling walker (2 wheeled) Gait Pattern/deviations: Step-through pattern;Decreased stride length;Trunk flexed Gait velocity: Decreased Gait velocity interpretation: <1.8 ft/sec, indicate of risk for recurrent falls General Gait Details: VC's for improved posture and general safety with RW. Pt insisting RW be lowered but standard RW already adjusted as low as it goes. Pt then asking for a youth walker. I did not request a youth walker as the one she was already brought is an appropriate height, bordering on too low for her as it is.    Stairs             Wheelchair Mobility    Modified Rankin (Stroke Patients Only)       Balance Overall balance assessment: Needs assistance Sitting-balance support: Feet supported Sitting balance-Leahy Scale: Fair     Standing balance support: Bilateral upper extremity supported Standing balance-Leahy Scale: Fair                              Cognition Arousal/Alertness: Lethargic;Suspect due to medications Behavior During Therapy: [facility] for tasks assessed/performed Overall Cognitive Status: Within Functional Limits for tasks assessed                                        Exercises      General Comments        Pertinent Vitals/Pain Pain Assessment: Faces Faces Pain  Scale: Hurts little more Pain Location: back, incisional Pain Descriptors / Indicators: Discomfort;Guarding Pain Intervention(s): Limited activity within patient's tolerance;Monitored during session;Repositioned    Home Living                      Prior Function            PT Goals (current goals can now be found in the care plan section) Acute Rehab PT Goals Patient Stated Goal: home when able PT Goal Formulation: With patient Progress  towards PT goals: Progressing toward goals    Frequency    Min 5X/week      PT Plan Current plan remains appropriate    Co-evaluation              AM-PAC PT "6 Clicks" Mobility   Outcome Measure  Help needed turning from your back to your side while in a flat bed without using bedrails?: None Help needed moving from lying on your back to sitting on the side of a flat bed without using bedrails?: None Help needed moving to and from a bed to a chair (including a wheelchair)?: A Little Help needed standing up from a chair using your arms (e.g., wheelchair or bedside chair)?: A Little Help needed to walk in hospital room?: A Little Help needed climbing 3-5 steps with a railing? : A Little 6 Click Score: 20    End of Session Equipment Utilized During Treatment: Gait belt;Back brace Activity Tolerance: Patient tolerated treatment well Patient left: in chair;with call bell/phone within reach Nurse Communication: Mobility status PT Visit Diagnosis: Unsteadiness on feet (R26.81);Pain Pain - part of body: (back)     Time: 3545-6256 PT Time Calculation (min) (ACUTE ONLY): 20 min  Charges:  $Gait Training: 8-22 mins                     Kathy [name], PT, DPT Acute Rehabilitation Services Pager: [phone] Office: [phone]    Kathy [name] 07/14/2019, 10:20 AM

## 2019-07-14 NOTE — Plan of Care (Signed)
Patient alert and oriented, mae's well, voiding adequate amount of urine, swallowing without difficulty, c/o pain at time of discharge and medication given. Patient discharged home with family. Script and discharged instructions given to patient. Patient and family stated understanding of instructions given. Patient has an appointment with Dr. Zada Finders

## 2019-07-14 NOTE — Progress Notes (Signed)
Occupational Therapy Treatment Patient Details Name: Kathy [name] MRN: [id number] DOB: 1955/05/10 Today's Date: 07/14/2019    History of present illness Pt is a 64 y/o female now s/p Left L5-S1 minimally invasive transforaminal lumbar interbody fusion with bilateral L5-S1 pedicle screw placement. PMHx includes depression, HTN, hx of cervical fusion (2014).    OT comments  Pt making steady progress towards OT goals this session. Pt continues to present with lethargy needing cues to attend to session as pt noted to fall asleep during session; suspect lethargy secondary to increased pain meds. Overall, pt requires MIN guard for functional mobility with RW and min guard for standing UB ADLs needing cues to maintain back precautions. Difficult to assess pts level of understanding concerning education as pt lethargic throughout session. Strongly encouraged pt to have family friends check on her intermittently as pt will requires assist with LB ADLs and IADLs as pt has cats. Demo'ed use of 3n1 in tub shower and provided handout to increase carryover. Agree with DC plan below, will follow acutely per POC.     Follow Up Recommendations  Home health OT;Supervision - Intermittent    Equipment Recommendations  3 in 1 bedside commode    Recommendations for Other Services      Precautions / Restrictions Precautions Precautions: Fall;Back Precaution Booklet Issued: Yes (comment) Precaution Comments: verbally reviewed during session, pt able to recall 2/3 precautions without cues  Required Braces or Orthoses: Spinal Brace Spinal Brace: Lumbar corset Restrictions Weight Bearing Restrictions: No       Mobility Bed Mobility Overal bed mobility: Needs Assistance Bed Mobility: Sidelying to Sit;Sit to Sidelying   Sidelying to sit: Supervision     Sit to sidelying: Supervision General bed mobility comments: Pt received in sidelying. Pt was able to transition to EOB with supervision for safety  and back to sidelying at end of session.  Transfers Overall transfer level: Needs assistance Equipment used: Rolling walker (2 wheeled) Transfers: Sit to/from Stand Sit to Stand: Min guard         General transfer comment: Min guard for balance and safety d/t level of arousal; cues for hand placement when rising from low toilet    Balance Overall balance assessment: Needs assistance Sitting-balance support: Feet supported Sitting balance-Leahy Scale: Fair     Standing balance support: During functional activity Standing balance-Leahy Scale: Fair Standing balance comment: able to complete dynamic tasks at sink with minguard                           ADL either performed or assessed with clinical judgement   ADL Overall ADL's : Needs assistance/impaired     Grooming: Wash/dry face;Standing;Min guard;Cueing for compensatory techniques Grooming Details (indicate cue type and reason): min guard for dynamic standing tasks d/t lethargy; education on using 2 cup method for standing oral care with pt verbalizing understanding         Upper Body Dressing : Minimal assistance;Sitting Upper Body Dressing Details (indicate cue type and reason): MIN A to don brace EOB   Lower Body Dressing Details (indicate cue type and reason): pt unable to figure four; education on needing external assist for LB dressing, encouraged use of dresses to decrease burden of care Toilet Transfer: Min guard;Ambulation;RW;Grab bars Toilet Transfer Details (indicate cue type and reason): min guard for safety as pt continues to be lethargic; cues to push froom grab bars vs pullign on RW Toileting- Clothing Manipulation and Hygiene: Supervision/safety;Sit to/from  Nurse, children's Details (indicate cue type and reason): visully demo'ed use of 3n1 in tub shower, however pt continues to be lethargic unable to practice transfer; issued pt visual handout to increase carryover and encouraged  sponge baths for awhil e Functional mobility during ADLs: Min guard;Rolling walker General ADL Comments: pt limited by lethargy this session; requries cues to attend to session as pt falling asleep while OTA talking; min guard for functional mobility with RW; continued education on maintaining back precautions during ADLs and IADLs; strongly encourage pt have support at home     Vision       Perception     Praxis      Cognition Arousal/Alertness: Lethargic;Suspect due to medications Behavior During Therapy: Flat affect Overall Cognitive Status: Within Functional Limits for tasks assessed                                 General Comments: pt continues to be lethargic throughout session; falling asleep during session requiring cues for pt to attend to task        Exercises     Shoulder Instructions       General Comments incision site clean and dry; issued pt hand out on tub transfer to 3n1    Pertinent Vitals/ Pain       Pain Assessment: Faces Faces Pain Scale: Hurts little more Pain Location: back, incisional Pain Descriptors / Indicators: Discomfort;Guarding Pain Intervention(s): Limited activity within patient's tolerance;Monitored during session;Repositioned  Home Living                                          Prior Functioning/Environment              Frequency  Min 2X/week        Progress Toward Goals  OT Goals(current goals can now be found in the care plan section)  Progress towards OT goals: Progressing toward goals  Acute Rehab OT Goals Patient Stated Goal: home when able OT Goal Formulation: With patient Time For Goal Achievement: 07/27/19 Potential to Achieve Goals: Good  Plan Discharge plan remains appropriate    Co-evaluation                 AM-PAC OT "6 Clicks" Daily Activity     Outcome Measure   Help from another person eating meals?: None Help from another person taking care of personal  grooming?: A Little Help from another person toileting, which includes using toliet, bedpan, or urinal?: A Little Help from another person bathing (including washing, rinsing, drying)?: A Little Help from another person to put on and taking off regular upper body clothing?: A Little Help from another person to put on and taking off regular lower body clothing?: A Little 6 Click Score: 19    End of Session Equipment Utilized During Treatment: Rolling walker;Back brace  OT Visit Diagnosis: Other abnormalities of gait and mobility (R26.89);Pain   Activity Tolerance Patient limited by lethargy   Patient Left in bed;with call bell/phone within reach   Nurse Communication Mobility status        Time: 7416-3845 OT Time Calculation (min): 21 min  Charges: OT General Charges $OT Visit: 1 Visit OT Treatments $Self Care/Home Management : 8-22 mins  Lanier Clam., COTA/L Acute Rehabilitation Services [phone]  Kathy [name] 07/14/2019, 10:57 AM

## 2019-07-14 NOTE — Progress Notes (Signed)
Neurosurgery Service Progress Note  Subjective: No acute events overnight, preop leg pain resolved, expected back pain, LLE numbness stable  Objective: Vitals:   07/13/19 2344 07/14/19 0030 07/14/19 0333 07/14/19 0718  BP: (!) 117/56  111/62 (!) 97/47  Pulse: (!) 106  97 (!) 103  Resp: 20  20 17   Temp: (!) 100.4 F (38 C) 97.9 F (36.6 C) 98.7 F (37.1 C) 98.2 F (36.8 C)  TempSrc: Oral Oral Oral Oral  SpO2: 94%  97% 93%  Weight:      Height:       Temp (24hrs), Avg:98.7 F (37.1 C), Min:97.8 F (36.6 C), Max:100.4 F (38 C)  CBC Latest Ref Rng & Units 07/08/2019 08/27/2018 04/19/2018  WBC 4.0 - 10.5 K/uL 6.6 6.7 8.5  Hemoglobin 12.0 - 15.0 g/dL 14.3 13.4 12.4  Hematocrit 36.0 - 46.0 % 43.4 42.8 38.8  Platelets 150 - 400 K/uL 306 285 329   BMP Latest Ref Rng & Units 07/08/2019 08/27/2018 04/19/2018  Glucose 70 - 99 mg/dL 95 104(H) 91  BUN 8 - 23 mg/dL 20 16 14   Creatinine 0.44 - 1.00 mg/dL 0.76 0.79 0.94  Sodium 135 - 145 mmol/L 139 139 138  Potassium 3.5 - 5.1 mmol/L 4.2 3.4(L) 3.6  Chloride 98 - 111 mmol/L 107 106 105  CO2 22 - 32 mmol/L 24 23 25   Calcium 8.9 - 10.3 mg/dL 9.0 8.8(L) 8.8(L)    Intake/Output Summary (Last 24 hours) at 07/14/2019 1043 Last data filed at 07/14/2019 3154 Gross per 24 hour  Intake 240 ml  Output --  Net 240 ml    Current Facility-Administered Medications:  .  0.9 %  sodium chloride infusion, 250 mL, Intravenous, Continuous, ,  A, MD .  acetaminophen (TYLENOL) tablet 650 mg, 650 mg, Oral, Q4H PRN, 650 mg at 07/14/19 0216 **OR** acetaminophen (TYLENOL) suppository 650 mg, 650 mg, Rectal, Q4H PRN, Judith Part, MD .  cyclobenzaprine (FLEXERIL) tablet 10 mg, 10 mg, Oral, TID PRN, Judith Part, MD, 10 mg at 07/14/19 0211 .  docusate sodium (COLACE) capsule 100 mg, 100 mg, Oral, BID, Judith Part, MD, 100 mg at 07/13/19 2114 .  fluticasone (FLONASE) 50 MCG/ACT nasal spray 1 spray, 1 spray, Each Nare, Daily PRN,  Judith Part, MD .  HYDROmorphone (DILAUDID) injection 1 mg, 1 mg, Intravenous, Q3H PRN, Judith Part, MD, 1 mg at 07/12/19 1901 .  levothyroxine (SYNTHROID) tablet 50 mcg, 50 mcg, Oral, QAC breakfast, Judith Part, MD, 50 mcg at 07/14/19 [phone] .  loratadine (CLARITIN) tablet 10 mg, 10 mg, Oral, Daily, Judith Part, MD, 10 mg at 07/13/19 0913 .  menthol-cetylpyridinium (CEPACOL) lozenge 3 mg, 1 lozenge, Oral, PRN **OR** phenol (CHLORASEPTIC) mouth spray 1 spray, 1 spray, Mouth/Throat, PRN, ,  A, MD .  ondansetron (ZOFRAN) tablet 4 mg, 4 mg, Oral, Q6H PRN **OR** ondansetron (ZOFRAN) injection 4 mg, 4 mg, Intravenous, Q6H PRN, ,  A, MD .  oxyCODONE (Oxy IR/ROXICODONE) immediate release tablet 10 mg, 10 mg, Oral, Q4H PRN, Judith Part, MD, 10 mg at 07/14/19 1041 .  pantoprazole (PROTONIX) EC tablet 40 mg, 40 mg, Oral, Daily, Judith Part, MD, 40 mg at 07/13/19 0913 .  polyethylene glycol (MIRALAX / GLYCOLAX) packet 17 g, 17 g, Oral, Daily PRN, ,  A, MD .  pregabalin (LYRICA) capsule 75 mg, 75 mg, Oral, BID, Judith Part, MD, 75 mg at 07/13/19 2114 .  rOPINIRole (REQUIP) tablet 2 mg, 2  mg, Oral, QHS, Judith Part, MD, 2 mg at 07/13/19 2116 .  sertraline (ZOLOFT) tablet 75 mg, 75 mg, Oral, QHS, Judith Part, MD, 75 mg at 07/13/19 2114 .  sodium chloride flush (NS) 0.9 % injection 3 mL, 3 mL, Intravenous, Q12H, , Joyice Faster, MD, 3 mL at 07/12/19 2126 .  sodium chloride flush (NS) 0.9 % injection 3 mL, 3 mL, Intravenous, PRN, , Joyice Faster, MD .  traZODone (DESYREL) tablet 50-100 mg, 50-100 mg, Oral, Daily PRN, Judith Part, MD   Physical Exam: Strength 5/5 x4, SILTx4 except LLE numbness worst in L5/S1 distribution Incisions c/d/i  Assessment & Plan: 64 y.o. woman s/p L5-S1 MIS TLIF, recovering well.  -discharge home today  Judith Part  07/14/19 10:43 AM

## 2019-07-22 DIAGNOSIS — Z6831 Body mass index (BMI) 31.0-31.9, adult: Secondary | ICD-10-CM

## 2019-07-22 HISTORY — DX: Body mass index (BMI) 31.0-31.9, adult: Z68.31

## 2019-09-07 ENCOUNTER — Ambulatory Visit: Payer: Medicare HMO | Admitting: Sports Medicine

## 2019-09-10 DIAGNOSIS — F331 Major depressive disorder, recurrent, moderate: Secondary | ICD-10-CM | POA: Insufficient documentation

## 2019-09-10 DIAGNOSIS — M2550 Pain in unspecified joint: Secondary | ICD-10-CM

## 2019-09-10 DIAGNOSIS — E611 Iron deficiency: Secondary | ICD-10-CM

## 2019-09-10 HISTORY — DX: Pain in unspecified joint: M25.50

## 2019-09-10 HISTORY — DX: Major depressive disorder, recurrent, moderate: F33.1

## 2019-09-10 HISTORY — DX: Iron deficiency: E61.1

## 2019-10-05 ENCOUNTER — Ambulatory Visit: Payer: Medicare HMO | Admitting: Sports Medicine

## 2020-02-15 DIAGNOSIS — D229 Melanocytic nevi, unspecified: Secondary | ICD-10-CM

## 2020-02-15 HISTORY — DX: Melanocytic nevi, unspecified: D22.9

## 2020-03-15 DIAGNOSIS — Z8616 Personal history of COVID-19: Secondary | ICD-10-CM | POA: Insufficient documentation

## 2020-03-15 HISTORY — DX: Personal history of COVID-19: Z86.16

## 2020-04-12 DIAGNOSIS — R1310 Dysphagia, unspecified: Secondary | ICD-10-CM

## 2020-04-12 HISTORY — DX: Dysphagia, unspecified: R13.10

## 2020-06-12 DIAGNOSIS — F321 Major depressive disorder, single episode, moderate: Secondary | ICD-10-CM | POA: Insufficient documentation

## 2020-06-12 HISTORY — DX: Major depressive disorder, single episode, moderate: F32.1

## 2020-08-23 HISTORY — DX: Morbid (severe) obesity due to excess calories: E66.01

## 2020-09-01 DIAGNOSIS — Z6825 Body mass index (BMI) 25.0-25.9, adult: Secondary | ICD-10-CM

## 2020-09-01 DIAGNOSIS — Z981 Arthrodesis status: Secondary | ICD-10-CM

## 2020-09-01 HISTORY — DX: Arthrodesis status: Z98.1

## 2020-09-01 HISTORY — DX: Body mass index (BMI) 25.0-25.9, adult: Z68.25

## 2020-09-25 DIAGNOSIS — R1012 Left upper quadrant pain: Secondary | ICD-10-CM

## 2020-09-25 HISTORY — DX: Left upper quadrant pain: R10.12

## 2020-10-02 ENCOUNTER — Encounter: Payer: Self-pay | Admitting: Cardiology

## 2020-10-02 NOTE — Progress Notes (Signed)
Cardiology Office Note   Date:  10/03/2020   ID:  Kathy [name], DOB May 20, 1955, MRN [id number]  PCP:  Raina Mina., MD  Cardiologist:   None Referring:  Gweneth Fritter, FNP  Chief Complaint  Patient presents with   Shortness of Breath       History of Present Illness: Kathy [name] is a 65 y.o. female who is referred by Gweneth Fritter, FNP for evaluation of HTN.    She has had severely elevated blood pressures.  She has a history of aortic atherosclerosis.  She says she was in the hospital about 5 days ago at [facility] in the emergency room with a pressure to 24/120.  She says she has had longstanding hypertension and has been on clonidine for years.  This was stopped because it was not working.  She was on a diuretic and she says in the emergency room she was placed on a medicine that eventually made her cough.  We found out that this was an ACE inhibitor.  About 4 5 days ago she was switched off of this to Coreg.  She brings her blood pressures and they are reasonably well controlled although somewhat labile.  Her systolics are sometimes [id number] and as low as 130.  Diastolics are well controlled.  Heart rate is reasonable.  She presents for follow-up of his labile blood pressures.  She does complain of shortness of breath.  She says this is been a chronic problem.  Her only cardiac history apparently includes a stress test some years ago.  She is limited in her activities because of fatigue but she does not have chest pressure, neck or arm discomfort.  She is not describing new shortness of breath, PND or orthopnea.  Of note she was told she had mitral valve prolapse some years ago.  I was able to review some records and I do not see any evidence of cardiac history.  She did see a cardiologist in the past.  Past Medical History:  Diagnosis Date   Aortic atherosclerosis ([place])    Arthritis    Depression    GERD (gastroesophageal reflux disease)    Hypertension     Pre-diabetes     Past Surgical History:  Procedure Laterality Date   ABDOMINAL HYSTERECTOMY     CERVICAL DISC SURGERY     x 3   CHOLECYSTECTOMY     COLONOSCOPY     KNEE ARTHROSCOPY     right   POSTERIOR CERVICAL FUSION/FORAMINOTOMY  02/17/2012   Procedure: POSTERIOR CERVICAL FUSION/FORAMINOTOMY LEVEL 2;  Surgeon: Hosie Spangle, MD;  Location: [place] [facility];  Service: Neurosurgery;  Laterality: N/A;  Cervical five to seven posterior arthrodesis   POSTERIOR CERVICAL FUSION/FORAMINOTOMY N/A 03/25/2012   Procedure: POSTERIOR CERVICAL FUSION/FORAMINOTOMY LEVEL 1;  Surgeon: Hosie Spangle, MD;  Location: [place] [facility];  Service: Neurosurgery;  Laterality: N/A;  Cervical five-six posterior cervical arthrodesis and instrumentation    RADIOLOGY WITH ANESTHESIA N/A 08/27/2018   Procedure: MRI LUMBAR SPINE WITHOUT CONTRAST;  Surgeon: Radiologist, Medication, MD;  Location: [place];  Service: Radiology;  Laterality: N/A;   SHOULDER DEBRIDEMENT     5- Right, 3 left   SINOSCOPY     TRANSFORAMINAL LUMBAR INTERBODY FUSION W/ MIS 1 LEVEL Left 07/12/2019   Procedure: Left Lumbar Five- Sacral One minimally invasive transforaminal lumbar interbody fusion;  Surgeon: Judith Part, MD;  Location: [place];  Service: Neurosurgery;  Laterality: Left;  Left Lumbar Five-  Sacral One minimally invasive transforaminal lumbar interbody fusion   TUBAL LIGATION       Current Outpatient Medications  Medication Sig Dispense Refill   acetaminophen (TYLENOL) 650 MG CR tablet Take 650 mg by mouth every 8 (eight) hours as needed for pain.     albuterol (VENTOLIN HFA) 108 (90 Base) MCG/ACT inhaler albuterol sulfate HFA 90 mcg/actuation aerosol inhaler     amoxicillin-clavulanate (AUGMENTIN) 500-125 MG tablet amoxicillin 500 mg-potassium clavulanate 125 mg tablet     aspirin 81 MG chewable tablet Chew by mouth daily.     azithromycin (ZITHROMAX) 250 MG tablet azithromycin 250 mg tablet     carvedilol (COREG) 6.25 MG  tablet Take 6.25 mg by mouth 2 (two) times daily with a meal.     celecoxib (CELEBREX) 200 MG capsule Take 200 mg by mouth 2 (two) times daily.     estradiol (ESTRACE) 0.5 MG tablet Take 0.5 mg by mouth daily.      famotidine (PEPCID) 40 MG tablet Take 40 mg by mouth daily.     FLUoxetine (PROZAC) 40 MG capsule Take 40 mg by mouth daily.     fluticasone (FLONASE) 50 MCG/ACT nasal spray Place 1 spray into both nostrils daily as needed for allergies.     hydrochlorothiazide (HYDRODIURIL) 25 MG tablet Take 1 tablet (25 mg total) by mouth daily. 90 tablet 2   levothyroxine (SYNTHROID) 50 MCG tablet Take 50 mcg by mouth daily before breakfast.     pantoprazole (PROTONIX) 40 MG tablet Take 40 mg by mouth daily.     rOPINIRole (REQUIP) 1 MG tablet Take 2 mg by mouth at bedtime.      No current facility-administered medications for this visit.    Allergies:   Gabapentin, Celecoxib, Clonazepam, Losartan, Mobic [meloxicam], Naproxen sodium, and Lisinopril    Social History:  The patient  reports that she has never smoked. She has never used smokeless tobacco. She reports current alcohol use. She reports that she does not use drugs.   Family History:  The patient's family history includes Colon cancer in her maternal aunt; Diabetes in her brother, mother, and sister; Heart disease in her maternal grandfather, paternal grandfather, paternal grandmother, and paternal uncle; Heart disease (age of onset: 32) in her brother; Heart disease (age of onset: 1) in her mother; Heart disease (age of onset: 64) in her father; High blood pressure in her brother and brother; Stroke in her brother.    ROS:  Please see the history of present illness.   Otherwise, review of systems are positive for fatigue, sweats, back cramping, stomach pain.   All other systems are reviewed and negative.    PHYSICAL EXAM: VS:  BP 132/78 (BP Location: Right Arm, Patient Position: Sitting, Cuff Size: Normal)   Pulse 66   Ht '5\' 3"'$   (1.6 m)   Wt 187 lb (84.8 kg)   SpO2 98%   BMI 33.13 kg/m  , BMI Body mass index is 33.13 kg/m. GENERAL:  Well appearing HEENT:  Pupils equal round and reactive, fundi not visualized, oral mucosa unremarkable NECK:  No jugular venous distention, waveform within normal limits, carotid upstroke brisk and symmetric, no bruits, no thyromegaly LYMPHATICS:  No cervical, inguinal adenopathy LUNGS:  Clear to auscultation bilaterally BACK:  No CVA tenderness CHEST:  Unremarkable HEART:  PMI not displaced or sustained,S1 and S2 within normal limits, no S3, no S4, no clicks, no rubs, no murmurs ABD:  Flat, positive bowel sounds normal in frequency in pitch,  no bruits, no rebound, no guarding, no midline pulsatile mass, no hepatomegaly, no splenomegaly EXT:  2 plus pulses throughout, no edema, no cyanosis no clubbing SKIN:  No rashes no nodules NEURO:  Cranial nerves II through XII grossly intact, motor grossly intact throughout PSYCH:  Cognitively intact, oriented to person place and time    EKG:  EKG is ordered today. The ekg ordered today demonstrates normal sinus rhythm, rate 66, axis within normal limits, intervals within normal limits, no acute ST-T wave changes.   Recent Labs: No results found for requested labs within last 8760 hours.    Lipid Panel No results found for: CHOL, TRIG, HDL, CHOLHDL, VLDL, LDLCALC, LDLDIRECT    Wt Readings from Last 3 Encounters:  10/03/20 187 lb (84.8 kg)  07/12/19 179 lb 9.6 oz (81.5 kg)  07/08/19 179 lb 9.6 oz (81.5 kg)      Other studies Reviewed: Additional studies/ records that were reviewed today include: Office records. . Review of the above records demonstrates:  Please see elsewhere in the note.     ASSESSMENT AND PLAN:  HTN: Her blood pressure actually seems to be relatively well controlled.  She does have some lower extremity swelling.  Because of this and the labile blood pressure she demonstrates I will add HCTZ 25 mg to her  regimen.  I believe she was on this previously and she will call us to confirm.   She will continue to keep a blood pressure diary.  SOB:   Given this complaint and her risk factors I would like to screen her with a POET (Plain Old Exercise Treadmill)   Current medicines are reviewed at length with the patient today.  The patient does not have concerns regarding medicines.  The following changes have been made:  no change  Labs/ tests ordered today include:  Orders Placed This Encounter  Procedures   Basic metabolic panel   EXERCISE TOLERANCE TEST (ETT)   EKG 12-Lead      Disposition:   FU with me as needed.      Signed, Minus Breeding, MD  10/03/2020 10:01 PM    [place]

## 2020-10-02 NOTE — Telephone Encounter (Signed)
This encounter was created in error - please disregard.

## 2020-10-03 ENCOUNTER — Telehealth: Payer: Self-pay | Admitting: Cardiology

## 2020-10-03 ENCOUNTER — Other Ambulatory Visit: Payer: Self-pay

## 2020-10-03 ENCOUNTER — Ambulatory Visit: Payer: Medicare HMO | Admitting: Cardiology

## 2020-10-03 ENCOUNTER — Encounter: Payer: Self-pay | Admitting: Cardiology

## 2020-10-03 VITALS — BP 132/78 | HR 66 | Ht 63.0 in | Wt 187.0 lb

## 2020-10-03 DIAGNOSIS — I10 Essential (primary) hypertension: Secondary | ICD-10-CM | POA: Diagnosis not present

## 2020-10-03 DIAGNOSIS — R0602 Shortness of breath: Secondary | ICD-10-CM

## 2020-10-03 MED ORDER — HYDROCHLOROTHIAZIDE 25 MG PO TABS: 25.0000 mg | ORAL_TABLET | Freq: Every day | ORAL | 2 refills | Status: DC

## 2020-10-03 NOTE — Patient Instructions (Addendum)
Medication Instructions:   Please call the office the name of medication you took in the past for blood pressure  *If you need a refill on your cardiac medications before your next appointment, please call your pharmacy*   Lab Work:  [facility]   Testing/Procedures:  Will be schedule at [place] has requested that you have an exercise tolerance test.  Please also follow instruction sheet, as given.   Follow-Up: At [facility], you and your health needs are our priority.  As part of our continuing mission to provide you with exceptional heart care, we have created designated Provider Care Teams.  These Care Teams include your primary Cardiologist (physician) and Advanced Practice Providers (APPs -  Physician Assistants and Nurse Practitioners) who all work together to provide you with the care you need, when you need it.     Your next appointment:    as needed if test is negative   The format for your next appointment:   In Person  Provider:   Minus Breeding, MD   Other Instructions

## 2020-10-03 NOTE — Telephone Encounter (Signed)
Spoke to patient . She states she has a bottle of Hydrochlorothiazide 25 mg at home.    Rn spoke to Dr Percival Spanish   Per order Patient is to restart  25 mg Hydrochlorothiazide daily along with carvedilol  twice a day    Patient voiced understanding.

## 2020-10-03 NOTE — Telephone Encounter (Signed)
Pt c/o medication issue:  1. Name of Medication: Hydro Chlorothiazide 25 MG's   2. How are you currently taking this medication (dosage and times per day)? Not currently taking   3. Are you having a reaction (difficulty breathing--STAT)? No   4. What is your medication issue? Kathy [name] is calling stating Dr. Percival Spanish wanted her to callback with the [facility] of this medication she was previously on.

## 2020-10-03 NOTE — Telephone Encounter (Signed)
Message routed to MD/RN Patient seen in office today

## 2020-10-06 ENCOUNTER — Telehealth: Payer: Self-pay | Admitting: *Deleted

## 2020-10-06 DIAGNOSIS — R7989 Other specified abnormal findings of blood chemistry: Secondary | ICD-10-CM

## 2020-10-06 DIAGNOSIS — R899 Unspecified abnormal finding in specimens from other organs, systems and tissues: Secondary | ICD-10-CM

## 2020-10-06 LAB — BASIC METABOLIC PANEL
BUN/Creatinine Ratio: 13 (ref 12–28)
BUN: 19 mg/dL (ref 8–27)
CO2: 23 mmol/L (ref 20–29)
Calcium: 9.5 mg/dL (ref 8.7–10.3)
Chloride: 96 mmol/L (ref 96–106)
Creatinine, Ser: 1.45 mg/dL — ABNORMAL HIGH (ref 0.57–1.00)
Glucose: 131 mg/dL — ABNORMAL HIGH (ref 65–99)
Potassium: 4.2 mmol/L (ref 3.5–5.2)
Sodium: 134 mmol/L (ref 134–144)
eGFR: 41 mL/min/{1.73_m2} — ABNORMAL LOW (ref >59)

## 2020-10-06 MED ORDER — HYDROCHLOROTHIAZIDE 12.5 MG PO CAPS: 12.5000 mg | ORAL_CAPSULE | Freq: Every day | ORAL | 2 refills | Status: DC

## 2020-10-06 NOTE — Telephone Encounter (Signed)
Minus Breeding, MD (12:19 PM)   She can do 1/2 dose      RN spoke to patient .She is aware she may decrease to 12.5 mg  daily .  Patient request a new prescription for 12.5 mg . She states medication is to small to cut in half.   New prescription sent. Medication list changed

## 2020-10-06 NOTE — Telephone Encounter (Signed)
The patient has been notified of the result and verbalized understanding.  All questions (if any) were answered.   She will go in 10 days to recheck - BMP.  Patient wanted to know if she can  take 1/2 tablet of  25 mg HCTZ.  She is becoming  nauseated and she was wondering if medication is effecting her lab work values   RN informed patient will defer to Dr Percival Spanish .    Routed lab results to PCP - Dr Leota Jacobsen, Roselyn Reef, RN 10/06/2020 10:43 AM

## 2020-10-06 NOTE — Telephone Encounter (Signed)
-----   Message from Minus Breeding, MD sent at 10/06/2020  9:55 AM EDT ----- Creat is up mildly.  Repeat in 10 days with a BMET.  Call Ms. Alan with the results and send results to Raina Mina., MD

## 2020-10-10 ENCOUNTER — Telehealth: Payer: Self-pay | Admitting: Cardiology

## 2020-10-10 NOTE — Telephone Encounter (Signed)
Pt state despite cutting HCTZ in half, she still continues to feel nauseated and drying heaving. Pt state she is now wondering if it could be related to HCTZ or carvedilol and has since stopped both medication this morning. However, pt state she still feel very nauseous even without medication. She report she doesn't have an appointment with GI physician until 10/3.  Nurse informed pt that is sounds like it could be something other than medication that's causing symptoms since she stopped them and still having symptoms. Nurse advise pt to contact pcp or GI physician for sooner appointment, but will still forward message to Dr. Percival Spanish for recommendations.    Last BP reading with HCTZ 9/4- 171/91 (9 am) 9/4-151/85 HR 77 (9 pm)  9/6-148/93 HR 74 (with HCTZ and carvedilol)

## 2020-10-10 NOTE — Telephone Encounter (Signed)
Pt c/o medication issue:  1. Name of Medication:   carvedilol (COREG) 6.25 MG tablet    2. How are you currently taking this medication (dosage and times per day)? As prescribed   3. Are you having a reaction (difficulty breathing--STAT)? Yes  4. What is your medication issue? Medication is causing nausea and dry heaving since last Wednesday/Thursday. Is not going to take this morning until she speak with someone from our office in regards to it.

## 2020-10-11 NOTE — Telephone Encounter (Signed)
Pt updated with MD's recommendations and verbalized understanding. Pt state she will resume Coreg.   Minus Breeding, MD  You 4 hours ago (9:27 AM)   She has been tried on many antihypertensive medications and has been taken off of all of them by one physician or another.  She has had hypertensive urgency.   Since she tolerated Coreg for the longest and it seemed to be working and she doesn't feel better off of it she should return to that med at the previous dose and see her PCP and or GI about her non cardiac complaints.

## 2020-10-12 ENCOUNTER — Encounter: Payer: Self-pay | Admitting: Nurse Practitioner

## 2020-10-16 ENCOUNTER — Other Ambulatory Visit: Payer: Self-pay | Admitting: *Deleted

## 2020-10-16 DIAGNOSIS — R899 Unspecified abnormal finding in specimens from other organs, systems and tissues: Secondary | ICD-10-CM

## 2020-10-16 DIAGNOSIS — R7989 Other specified abnormal findings of blood chemistry: Secondary | ICD-10-CM

## 2020-10-17 LAB — BASIC METABOLIC PANEL
BUN/Creatinine Ratio: 15 (ref 12–28)
BUN: 12 mg/dL (ref 8–27)
CO2: 26 mmol/L (ref 20–29)
Calcium: 9.1 mg/dL (ref 8.7–10.3)
Chloride: 101 mmol/L (ref 96–106)
Creatinine, Ser: 0.79 mg/dL (ref 0.57–1.00)
Glucose: 94 mg/dL (ref 65–99)
Potassium: 4.6 mmol/L (ref 3.5–5.2)
Sodium: 138 mmol/L (ref 134–144)
eGFR: 84 mL/min/{1.73_m2} (ref >59)

## 2020-10-18 ENCOUNTER — Telehealth (HOSPITAL_COMMUNITY): Payer: Self-pay | Admitting: *Deleted

## 2020-10-18 NOTE — Telephone Encounter (Signed)
Close encounter 

## 2020-10-19 ENCOUNTER — Other Ambulatory Visit: Payer: Self-pay

## 2020-10-19 ENCOUNTER — Ambulatory Visit (HOSPITAL_COMMUNITY)
Admission: RE | Admit: 2020-10-19 | Discharge: 2020-10-19 | Disposition: A | Discharge status: Home / Self Care | Payer: Medicare HMO | Source: Ambulatory Visit | Attending: Cardiology | Admitting: Cardiology

## 2020-10-19 DIAGNOSIS — I10 Essential (primary) hypertension: Secondary | ICD-10-CM | POA: Diagnosis not present

## 2020-10-19 DIAGNOSIS — R0602 Shortness of breath: Secondary | ICD-10-CM | POA: Diagnosis not present

## 2020-10-20 ENCOUNTER — Other Ambulatory Visit (HOSPITAL_COMMUNITY): Payer: Self-pay | Admitting: Cardiology

## 2020-10-20 DIAGNOSIS — I10 Essential (primary) hypertension: Secondary | ICD-10-CM

## 2020-10-20 DIAGNOSIS — R0602 Shortness of breath: Secondary | ICD-10-CM

## 2020-10-20 LAB — EXERCISE TOLERANCE TEST
Angina Index: 0
Base ST Depression (mm): 0 mm
Duke Treadmill Score: 6
Estimated workload: 7.2
Exercise duration (min): 6 min
Exercise duration (sec): 9 s
MPHR: 157 {beats}/min
Peak HR: 136 {beats}/min
Percent HR: 86 %
Rest HR: 76 {beats}/min
ST Depression (mm): 0 mm

## 2020-10-23 ENCOUNTER — Telehealth: Payer: Self-pay | Admitting: Cardiology

## 2020-10-23 NOTE — Telephone Encounter (Signed)
Minus Breeding, MD  10/22/2020  9:22 AM EDT     She had a negative POET (Plain Old Exercise Treadmill) for ischemia.  There was a hypertensive BP response and we need to keep working on this.  Call Ms. Ewings with the results and send results to Raina Mina., MD    Discussed with patient, aware and verbalized understanding.

## 2020-10-23 NOTE — Telephone Encounter (Signed)
Patient would like for someone to call her to explain her mychart for results. Please call back to discuss

## 2020-11-08 ENCOUNTER — Ambulatory Visit: Payer: Medicare HMO | Admitting: Nurse Practitioner

## 2020-11-28 ENCOUNTER — Ambulatory Visit: Payer: Medicare HMO | Admitting: Cardiology

## 2020-12-13 DIAGNOSIS — N3941 Urge incontinence: Secondary | ICD-10-CM

## 2020-12-13 HISTORY — DX: Urge incontinence: N39.41

## 2021-01-03 ENCOUNTER — Ambulatory Visit: Payer: Medicare HMO | Admitting: Sports Medicine

## 2021-01-12 ENCOUNTER — Encounter: Payer: Self-pay | Admitting: Neurology

## 2021-01-12 ENCOUNTER — Ambulatory Visit (INDEPENDENT_AMBULATORY_CARE_PROVIDER_SITE_OTHER): Payer: Medicare HMO | Admitting: Sports Medicine

## 2021-01-12 ENCOUNTER — Telehealth: Payer: Self-pay | Admitting: Sports Medicine

## 2021-01-12 ENCOUNTER — Other Ambulatory Visit: Payer: Self-pay | Admitting: Sports Medicine

## 2021-01-12 ENCOUNTER — Encounter: Payer: Self-pay | Admitting: Sports Medicine

## 2021-01-12 DIAGNOSIS — M461 Sacroiliitis, not elsewhere classified: Secondary | ICD-10-CM | POA: Insufficient documentation

## 2021-01-12 DIAGNOSIS — R252 Cramp and spasm: Secondary | ICD-10-CM

## 2021-01-12 DIAGNOSIS — I739 Peripheral vascular disease, unspecified: Secondary | ICD-10-CM | POA: Diagnosis not present

## 2021-01-12 DIAGNOSIS — S22000A Wedge compression fracture of unspecified thoracic vertebra, initial encounter for closed fracture: Secondary | ICD-10-CM

## 2021-01-12 DIAGNOSIS — G609 Hereditary and idiopathic neuropathy, unspecified: Secondary | ICD-10-CM

## 2021-01-12 DIAGNOSIS — M79673 Pain in unspecified foot: Secondary | ICD-10-CM

## 2021-01-12 DIAGNOSIS — R09A2 Foreign body sensation, throat: Secondary | ICD-10-CM

## 2021-01-12 DIAGNOSIS — R0989 Other specified symptoms and signs involving the circulatory and respiratory systems: Secondary | ICD-10-CM | POA: Insufficient documentation

## 2021-01-12 HISTORY — DX: Sacroiliitis, not elsewhere classified: M46.1

## 2021-01-12 HISTORY — DX: Wedge compression fracture of unspecified thoracic vertebra, initial encounter for closed fracture: S22.000A

## 2021-01-12 HISTORY — DX: Foreign body sensation, throat: R09.A2

## 2021-01-12 MED ORDER — CYCLOBENZAPRINE HCL 10 MG PO TABS: 10.0000 mg | ORAL_TABLET | Freq: Three times a day (TID) | ORAL | 0 refills | Status: DC | PRN

## 2021-01-12 NOTE — Progress Notes (Signed)
Flexeril was sent

## 2021-01-12 NOTE — Progress Notes (Signed)
Subjective: Kathy [name] is a 65 y.o. female patient who presents to office for evaluation of left greater than right foot pain states that there is some cramping swelling numbness and foot feels cold especially on the left history of back surgery to 3 in June and reports that after that the symptoms started to progress on her left side states that when she was on her blood pressure pill or diuretic the cramping was worse patient denies any other pedal complaints at this time.  Patient Active Problem List   Diagnosis Date Noted   Compression fracture of thoracic vertebra ([place]) 01/12/2021   Globus sensation 01/12/2021   Inflammation of sacroiliac joint ([place]) 01/12/2021   Urge incontinence of urine 12/13/2020   Left upper quadrant abdominal pain 09/25/2020   History of lumbar fusion 09/01/2020   Body mass index (BMI) 25.0-25.9, adult 09/01/2020   Morbid obesity due to excess calories ([place]) 08/23/2020   Current moderate episode of major depressive disorder without prior episode ([place]) 06/12/2020   Dysphagia 04/12/2020   History of COVID-19 03/15/2020   Atypical nevi 02/15/2020   Iron deficiency 09/10/2019   Moderate episode of recurrent major depressive disorder ([place]) 09/10/2019   Polyarthralgia 09/10/2019   Body mass index (BMI) 31.0-31.9, adult 07/22/2019   Lumbar radiculopathy 07/12/2019   Elevated blood-pressure reading, without diagnosis of hypertension 06/18/2019   Positive ANA (antinuclear antibody) 03/02/2019   Bronchiectasis without complication ([place]) 95/62/1308   Other osteoporosis without current pathological fracture 12/17/2018   Pain in thoracic spine 10/20/2018   Aortic atherosclerosis ([place]) 04/22/2018   NASH (nonalcoholic steatohepatitis) 04/22/2018   Edema 03/30/2018   Prediabetes 03/30/2018   Atherosclerosis of both carotid arteries 01/01/2018   Thyroid nodule 01/01/2018   Laryngopharyngeal reflux (LPR) 11/19/2017   Hormone replacement therapy 10/28/2017   Chronic  cough 10/08/2017   Dysphonia 10/08/2017   Pulmonary nodule 09/09/2017   LPRD (laryngopharyngeal reflux disease) 04/18/2017   Asthma, not well controlled, mild persistent, with acute exacerbation 04/18/2017   Inflammatory dermatosis 04/18/2017   Other allergic rhinitis 04/18/2017   Bilateral knee pain 03/26/2017   Bilateral shoulder pain 03/26/2017   Fibromyalgia 12/02/2016   Hypothyroidism (acquired) 07/02/2016   Essential hypertension 05/20/2016   Localized edema 05/20/2016   Exertional shortness of breath 11/22/2015   Near syncope 11/22/2015   Anxiety 08/14/2015   Degeneration of lumbar intervertebral disc 04/05/2015   GERD (gastroesophageal reflux disease) 04/05/2015   Recurrent major depressive disorder, in remission ([place]) 04/05/2015   Restless legs syndrome 04/05/2015   Vitamin B12 deficiency 04/05/2015   Vitamin D deficiency disease 04/05/2015   Chronic sinusitis 04/04/2015   Malaise and fatigue 04/04/2015   Mixed hyperlipidemia 04/04/2015   Osteoarthritis of multiple joints 04/04/2015   Osteopenia 04/04/2015   Cervicogenic headache 11/25/2013   Strain of neck muscle 04/07/2013   OTH&UNSPEC SUP INJURY SHLDR&UPPER ARM INF 08/23/2008   CONSTIPATION, HX OF 08/23/2008   ARTHROSCOPY, HX OF 08/23/2008   CHOLECYSTECTOMY, HX OF 08/23/2008   OTHER POSTSURGICAL STATUS OTHER 08/23/2008   ACQUIRED ABSENCE OF BOTH CERVIX AND UTERUS 08/23/2008   History of arthrodesis 08/23/2008   UNSPEC LOCAL INFECTION SKIN&SUBCUTANEOUS TISSUE 08/06/2008    Current Outpatient Medications on File Prior to Visit  Medication Sig Dispense Refill   busPIRone (BUSPAR) 5 MG tablet Take  1 tab 2 times a day     acetaminophen (TYLENOL) 650 MG CR tablet Take 650 mg by mouth every 8 (eight) hours as needed for pain.     albuterol (VENTOLIN  HFA) 108 (90 Base) MCG/ACT inhaler albuterol sulfate HFA 90 mcg/actuation aerosol inhaler     aspirin 81 MG chewable tablet Chew by mouth daily.     carvedilol (COREG)  6.25 MG tablet Take 6.25 mg by mouth 2 (two) times daily with a meal.     celecoxib (CELEBREX) 200 MG capsule Take 200 mg by mouth 2 (two) times daily.     dicyclomine (BENTYL) 10 MG capsule Take 10 mg by mouth every 6 (six) hours as needed.     estradiol (ESTRACE) 0.5 MG tablet Take 0.5 mg by mouth daily.      famotidine (PEPCID) 40 MG tablet Take 40 mg by mouth daily.     FLUoxetine (PROZAC) 40 MG capsule Take 40 mg by mouth daily.     fluticasone (FLONASE) 50 MCG/ACT nasal spray Place 1 spray into both nostrils daily as needed for allergies.     levothyroxine (SYNTHROID) 50 MCG tablet Take 50 mcg by mouth daily before breakfast.     ondansetron (ZOFRAN) 4 MG tablet Take 4 mg by mouth every 6 (six) hours as needed.     pantoprazole (PROTONIX) 40 MG tablet Take 40 mg by mouth daily.     predniSONE (DELTASONE) 20 MG tablet Take 20 mg by mouth 2 (two) times daily.     promethazine-dextromethorphan (PROMETHAZINE-DM) 6.25-15 MG/5ML syrup SMARTSIG:1 Teaspoon By Mouth Every 6 Hours PRN     rOPINIRole (REQUIP) 1 MG tablet Take 2 mg by mouth at bedtime.      No current facility-administered medications on file prior to visit.    Allergies  Allergen Reactions   Gabapentin Swelling    From 06/21/2013 OV: Pt thinks it affects her speech some "more thick tongued".   Amlodipine Swelling   Celecoxib Itching   Clonazepam Other (See Comments)    syncope   Losartan Other (See Comments)    unknown   Mobic [Meloxicam] Itching   Naproxen Sodium Itching   Lisinopril Cough    Objective:  General: Alert and oriented x3 in no acute distress  Dermatology: No open lesions bilateral lower extremities, no webspace macerations, no ecchymosis bilateral, all nails x 10 are well manicured.  Vascular: Dorsalis Pedis and Posterior Tibial pedal pulses palpable, Capillary Fill Time 3 seconds,(+) pedal hair growth bilateral, trace edema bilateral lower extremities, Temperature gradient within normal  limits.  Neurology: Gross sensation intact via light touch bilateral, Protective sensation intact with Thornell Mule Monofilament to all pedal sites, vibratory sensation diminished on the left as compared to the right.    Musculoskeletal: No reproducible tenderness to palpation bilateral.  Range of motion appears to be within normal limits bilateral.  Assessment and Plan: Problem List Items Addressed This Visit   None Visit Diagnoses     Idiopathic neuropathy    -  Primary   PVD (peripheral vascular disease) ([place])       Pain of foot, unspecified laterality            -Complete examination performed -Xrays reviewed -Discussed treatement options but I suspect his neuropathy possibly related to her previous back surgery -Rx Flexeril for cramping -Referral placed to neurology for evaluation of small versus large fiber neuropathy may benefit from nerve studies -Advised patient to try over-the-counter Nervive nerve relief supplement at this time since patient had a previous allergic reaction to any type of neuropathy prescription medications -Patient to return to office after neurology or sooner if condition worsens.  Landis Martins, DPM

## 2021-01-12 NOTE — Telephone Encounter (Signed)
[facility] called in for Flexeril

## 2021-01-26 ENCOUNTER — Ambulatory Visit: Payer: Medicare HMO | Admitting: Neurology

## 2021-02-08 ENCOUNTER — Other Ambulatory Visit: Payer: Self-pay | Admitting: Sports Medicine

## 2021-02-08 NOTE — Telephone Encounter (Signed)
Please advise 

## 2021-02-08 NOTE — Progress Notes (Signed)
[facility] Clinic Note - Initial Visit   Date: 02/09/21  AVNOOR KOURY MRN: [id number] DOB: 04/06/1955   Dear Dr.Stover:  Thank you for your kind referral of Kathy [name] for consultation of bilateral feet pain. Although her history is well known to you, please allow [place] to reiterate it for the purpose of our medical record. The patient was accompanied to the clinic by self.   History of Present Illness: Kathy [name] is a 66 y.o. right-handed female with hypertension, depression, GERD, aortic atherosclerosis, pre-diabetes, and s/p cervical and lumbar surgery presenting for evaluation of bilateral feet pain.   She underwent left L5-S1 transforaminal interbody fusion in 2021 by Dr. Zada Finders for lumbar radiculopathy and low back pain.  MRI lumbar spine from 2020 shows severe left neuraforaminal stenosis at L5-S1 and moderate right neuroforaminal stenosis at L4-5.  Following her surgery, she began having numbness over the left lateral foot and sole of the foot, which has been constant.  Nothing exacerbates or alleviates her symptoms.  She does not take medication as symptoms are more aggravating, then painful.  She does not have numbness/tingling involving the right foot.    She also has RLS and takes ropinirole 1mg  in the morning and up to 5mg  at bedtime.  This is prescribed by her PCP.   Out-side paper records, electronic medical record, and images have been reviewed where available and summarized as:  MRI lumbar spine 08/27/2018: 1. Overall mildly progressed multilevel lumbar spondylosis as described above. Progressive now moderate to severe left neuroforaminal stenosis at L5-S1 and mild-to-moderate right neuroforaminal stenosis at L4-L5.  Labs 12/2020:  HbA1c 5.3, B12 > 1500  Past Medical History:  Diagnosis Date   Aortic atherosclerosis (HCC)    Arthritis    Depression    GERD (gastroesophageal reflux disease)    Hypertension     Pre-diabetes     Past Surgical History:  Procedure Laterality Date   ABDOMINAL HYSTERECTOMY     CERVICAL DISC SURGERY     x 3   CHOLECYSTECTOMY     COLONOSCOPY     KNEE ARTHROSCOPY     right   POSTERIOR CERVICAL FUSION/FORAMINOTOMY  02/17/2012   Procedure: POSTERIOR CERVICAL FUSION/FORAMINOTOMY LEVEL 2;  Surgeon: Hosie Spangle, MD;  Location: [facility];  Service: Neurosurgery;  Laterality: N/A;  Cervical five to seven posterior arthrodesis   POSTERIOR CERVICAL FUSION/FORAMINOTOMY N/A 03/25/2012   Procedure: POSTERIOR CERVICAL FUSION/FORAMINOTOMY LEVEL 1;  Surgeon: Hosie Spangle, MD;  Location: [facility];  Service: Neurosurgery;  Laterality: N/A;  Cervical five-six posterior cervical arthrodesis and instrumentation    RADIOLOGY WITH ANESTHESIA N/A 08/27/2018   Procedure: MRI LUMBAR SPINE WITHOUT CONTRAST;  Surgeon: Radiologist, Medication, MD;  Location: [facility];  Service: Radiology;  Laterality: N/A;   SHOULDER DEBRIDEMENT     5- Right, 3 left   SINOSCOPY     TRANSFORAMINAL LUMBAR INTERBODY FUSION W/ MIS 1 LEVEL Left 07/12/2019   Procedure: Left Lumbar Five- Sacral One minimally invasive transforaminal lumbar interbody fusion;  Surgeon: Judith Part, MD;  Location: [facility];  Service: Neurosurgery;  Laterality: Left;  Left Lumbar Five- Sacral One minimally invasive transforaminal lumbar interbody fusion   TUBAL LIGATION       Medications:  Outpatient Encounter Medications as of 02/09/2021  Medication Sig   aspirin 81 MG chewable tablet Chew by mouth daily.   busPIRone (BUSPAR) 5 MG tablet Take  1 tab 2 times a day   carvedilol (  COREG) 6.25 MG tablet Take 6.25 mg by mouth 2 (two) times daily with a meal.   celecoxib (CELEBREX) 200 MG capsule Take 200 mg by mouth 2 (two) times daily.   estradiol (ESTRACE) 0.5 MG tablet Take 0.5 mg by mouth daily.    famotidine (PEPCID) 40 MG tablet Take 40 mg by mouth daily.   FLUoxetine (PROZAC) 40 MG capsule Take 40 mg by mouth daily.    levothyroxine (SYNTHROID) 50 MCG tablet Take 50 mcg by mouth daily before breakfast.   pantoprazole (PROTONIX) 40 MG tablet Take 40 mg by mouth daily.   rOPINIRole (REQUIP) 1 MG tablet Take 2 mg by mouth at bedtime.    acetaminophen (TYLENOL) 650 MG CR tablet Take 650 mg by mouth every 8 (eight) hours as needed for pain. (Patient not taking: Reported on 02/09/2021)   albuterol (VENTOLIN HFA) 108 (90 Base) MCG/ACT inhaler albuterol sulfate HFA 90 mcg/actuation aerosol inhaler (Patient not taking: Reported on 02/09/2021)   cyclobenzaprine (FLEXERIL) 10 MG tablet TAKE ONE TABLET BY MOUTH 3 TIMES DAILY AS NEEDED FOR MUSCLE SPASMS (Patient not taking: Reported on 02/09/2021)   dicyclomine (BENTYL) 10 MG capsule Take 10 mg by mouth every 6 (six) hours as needed. (Patient not taking: Reported on 02/09/2021)   fluticasone (FLONASE) 50 MCG/ACT nasal spray Place 1 spray into both nostrils daily as needed for allergies. (Patient not taking: Reported on 02/09/2021)   ondansetron (ZOFRAN) 4 MG tablet Take 4 mg by mouth every 6 (six) hours as needed. (Patient not taking: Reported on 02/09/2021)   predniSONE (DELTASONE) 20 MG tablet Take 20 mg by mouth 2 (two) times daily. (Patient not taking: Reported on 02/09/2021)   promethazine-dextromethorphan (PROMETHAZINE-DM) 6.25-15 MG/5ML syrup SMARTSIG:1 Teaspoon By Mouth Every 6 Hours PRN (Patient not taking: Reported on 02/09/2021)   [DISCONTINUED] cyclobenzaprine (FLEXERIL) 10 MG tablet Take 1 tablet (10 mg total) by mouth 3 (three) times daily as needed for muscle spasms.   No facility-administered encounter medications on file as of 02/09/2021.    Allergies:  Allergies  Allergen Reactions   Gabapentin Swelling    From 06/21/2013 OV: Pt thinks it affects her speech some "more thick tongued".   Amlodipine Swelling   Celecoxib Itching   Clonazepam Other (See Comments)    syncope   Losartan Other (See Comments)    unknown   Mobic [Meloxicam] Itching   Naproxen Sodium Itching    Lisinopril Cough    Family History: Family History  Problem Relation Age of Onset   Heart disease Mother 21   Diabetes Mother    Heart disease Father 38   Diabetes Sister    High blood pressure Brother    High blood pressure Brother    Heart disease Brother 79   Stroke Brother    Diabetes Brother    Heart disease Maternal Grandfather    Heart disease Paternal Grandmother    Heart disease Paternal Grandfather    Colon cancer Maternal Aunt    Heart disease Paternal Uncle     Social History: Social History   Tobacco Use   Smoking status: Never   Smokeless tobacco: Never  Vaping Use   Vaping Use: Never used  Substance Use Topics   Alcohol use: Yes    Alcohol/week: 0.0 standard drinks    Comment: rarely - , 1 per month   Drug use: No   Social History   Social History Narrative   Lives alone.  No children.     Right Handed  Lives in a two story home     Vital Signs:  BP (!) 153/77    Pulse 81    Ht 5\' 3"  (1.6 m)    Wt 186 lb (84.4 kg)    SpO2 93%    BMI 32.95 kg/m   Neurological Exam: MENTAL STATUS including orientation to time, place, person, recent and remote memory, attention span and concentration, language, and fund of knowledge is normal.  Speech is not dysarthric.  CRANIAL NERVES: II:  No visual field defects.   III-IV-VI: Pupils equal round and reactive to light.  Normal conjugate, extra-ocular eye movements in all directions of gaze.  No nystagmus.  No ptosis.   V:  Normal facial sensation.    VII:  Normal facial symmetry and movements.   VIII:  Normal hearing and vestibular function.   IX-X:  Normal palatal movement.   XI:  Normal shoulder shrug and head rotation.   XII:  Normal tongue strength and range of motion, no deviation or fasciculation.  MOTOR:  No atrophy, fasciculations or abnormal movements.  No pronator drift.   Upper Extremity:  Right  Left  Deltoid  5/5   5/5   Biceps  5/5   5/5   Triceps  5/5   5/5   Infraspinatus 5/5  5/5   Medial pectoralis 5/5  5/5  Wrist extensors  5/5   5/5   Wrist flexors  5/5   5/5   Finger extensors  5/5   5/5   Finger flexors  5/5   5/5   Dorsal interossei  5/5   5/5   Abductor pollicis  5/5   5/5   Tone ([place] scale)  0  0   Lower Extremity:  Right  Left  Hip flexors  5/5   5/5   Hip extensors  5/5   5/5   Adductor 5/5  5/5  Abductor 5/5  5/5  Knee flexors  5/5   5/5   Knee extensors  5/5   5/5   Dorsiflexors  5/5   5/5   Plantarflexors  5/5   5/5   Toe extensors  5/5   5/5   Toe flexors  5/5   5/5   Tone (Ashworth scale)  0  0   MSRs:  Right        Left                  brachioradialis 2+  2+  biceps 2+  2+  triceps 2+  2+  patellar 2+  2+  ankle jerk 2+  1+  Hoffman no  no  plantar response down  down   SENSORY:  Reduced temperature and pin prick over the left lateral foot, otherwise, normal and symmetric perception of light touch, pinprick, vibration, and proprioception.  Romberg's sign absent.   COORDINATION/GAIT: Normal finger-to- nose-finger.  Intact rapid alternating movements bilaterally.  Gait narrow based and stable. Tandem and stressed gait intact.    IMPRESSION: Left foot paresthesias secondary to residual deficits from left S1 radiculopathy s/p L5-S1 transforaminal lumbar interbody fusion in 2021. Exam and symptoms are consistent with S1 sensory radiculopathy.  - I explained to pt that given her longstanding stenosis at this level, there was chronic impingement causing permanent nerve damage resulting in paresthesias involving the S1 dermatome.  She does not wish to be on medication as pain is minimal and symptoms are more aggravating than painful  - NCS/EMG discussed if symptoms get worse  2.  Restless leg syndrome  - Limit ropinirole to daily maximum of 4mg /d to minimize risk of augmentation  - She may take ropinirole 1mg  in the morning and 3mg  at bedtime  Return to clinic as needed   Thank you for allowing me to participate in patient's  care.  If I can answer any additional questions, I would be pleased to do so.    Sincerely,     K. Posey Pronto, DO

## 2021-02-09 ENCOUNTER — Other Ambulatory Visit: Payer: Self-pay

## 2021-02-09 ENCOUNTER — Ambulatory Visit (INDEPENDENT_AMBULATORY_CARE_PROVIDER_SITE_OTHER): Payer: Medicare HMO | Admitting: Neurology

## 2021-02-09 ENCOUNTER — Encounter: Payer: Self-pay | Admitting: Neurology

## 2021-02-09 VITALS — BP 153/77 | HR 81 | Ht 63.0 in | Wt 186.0 lb

## 2021-02-09 DIAGNOSIS — G2581 Restless legs syndrome: Secondary | ICD-10-CM

## 2021-02-09 DIAGNOSIS — M5417 Radiculopathy, lumbosacral region: Secondary | ICD-10-CM | POA: Diagnosis not present

## 2021-02-09 NOTE — Patient Instructions (Signed)
Limit ropinirole to 4mg /d. You can take 1 mg in the morning and 3mg  at bedtime  You can take flexeril 10mg  at bedtime  If your symptoms get worse, we can do nerve testing

## 2021-02-23 ENCOUNTER — Ambulatory Visit: Payer: Medicare HMO | Admitting: Sports Medicine

## 2021-03-05 ENCOUNTER — Telehealth: Payer: Self-pay | Admitting: Neurology

## 2021-03-05 NOTE — Telephone Encounter (Signed)
Patient is calling Kathy [name] back, she is at home now and can talk

## 2021-03-05 NOTE — Telephone Encounter (Signed)
Called patient and left a message for a call back.  

## 2021-03-05 NOTE — Telephone Encounter (Signed)
At her last visit, I recommended that she limit ropinirole to 4mg /d to prevent worsening of symptoms. Can verify the dose she is taking? The alternative medication is gabapentin or Lyrica - would she like to try lose does of this?

## 2021-03-05 NOTE — Telephone Encounter (Signed)
Pt called stating her legs are hurting really bad.  She has been taking the Ropinirole.  She wants to know if there is something else she can try.

## 2021-03-06 MED ORDER — GABAPENTIN 300 MG PO CAPS: 300.0000 mg | ORAL_CAPSULE | Freq: Every day | ORAL | 5 refills | Status: DC

## 2021-03-06 NOTE — Telephone Encounter (Signed)
Called patient and asked what dose of ropinirole she is taking and patient stated that she is only taking 4 mg. Informed patient that Dr. Posey Pronto would like to try an alternative. Patient stated that she has taken Lyrica and it makes her swell. Patient is ok with trying Gabapentin and would like it sent to [facility]. Patient was informed to give the medication atleast 2 weeks to have time to work.

## 2021-03-06 NOTE — Telephone Encounter (Signed)
Patient is aware that rx has been sent.

## 2021-03-06 NOTE — Telephone Encounter (Signed)
Rx sent for gabapentin 300mg  1 tablet at bedtime.

## 2021-03-09 ENCOUNTER — Telehealth: Payer: Self-pay | Admitting: Neurology

## 2021-03-09 NOTE — Telephone Encounter (Signed)
Patient said she needs relief from her RLS. She cant handle the pain no longer. She has upped her meds to one in the am and one at night. Almost went to ER last night.

## 2021-03-09 NOTE — Telephone Encounter (Signed)
She needs to schedule a follow-up visit to discuss medication management. I sent prescription for gabapentin on 1/30 to her pharmacy and these medications take at least 1-2 weeks to take effect. I

## 2021-03-09 NOTE — Telephone Encounter (Signed)
Called patient and informed her She needs to schedule a follow-up visit to discuss medication management. Informed patient that Dr. Posey Pronto sent prescription for gabapentin on 1/30 to her pharmacy and these medications take at least 1-2 weeks to take effect. I offered patient a follow up appt on Wednesday per Dr. Posey Pronto and patient stated she can do that. Patient is aware that someone from the front will call her Monday to get her scheduled in Dr. Serita Grit work in spot.  Patient was advised that if the pain becomes worse or unbearable to seek evaluation at the ER. Patient was directed to continue taking her medication as directed by Dr. Posey Pronto.

## 2021-03-12 NOTE — Telephone Encounter (Signed)
I spoke with patient and she is aware of the appt for 03-14-21 at 10:30 with Kathy [name]

## 2021-03-14 ENCOUNTER — Other Ambulatory Visit: Payer: Self-pay

## 2021-03-14 ENCOUNTER — Ambulatory Visit (INDEPENDENT_AMBULATORY_CARE_PROVIDER_SITE_OTHER): Payer: Medicare HMO | Admitting: Neurology

## 2021-03-14 ENCOUNTER — Encounter: Payer: Self-pay | Admitting: Neurology

## 2021-03-14 VITALS — BP 159/87 | HR 91 | Ht 63.0 in | Wt 184.0 lb

## 2021-03-14 DIAGNOSIS — G2581 Restless legs syndrome: Secondary | ICD-10-CM

## 2021-03-14 MED ORDER — CARBIDOPA-LEVODOPA 25-100 MG PO TABS
1.0000 | ORAL_TABLET | Freq: Every day | ORAL | 0 refills | Status: DC
Start: 2021-03-14 — End: 2021-10-01

## 2021-03-14 NOTE — Patient Instructions (Signed)
Start sinemet 25/100 1 tablet at bedtime for restless leg syndrome Continue ropinirole 2mg  3 hours before bedtime Stop gabapentin Keep your appointment with sleep specialist next week

## 2021-03-14 NOTE — Progress Notes (Signed)
Follow-up Visit   Date: 03/14/21   Kathy [name] MRN: [id number] DOB: 1955-07-17   Interim History: Kathy [name] is a 66 y.o. right-handed Caucasian female with hypertension, depression, GERD, aortic atherosclerosis, prediabetes, and s/p cervical and lumbar surgery returning to the clinic for follow-up of restless leg syndrome.  The patient was accompanied to the clinic by self.  She is here with complaints that her RLS is getting worse.  Ather last visit, I asked her to limit ropinirole to 4mg /d due to risk of augmentation. She was able to reduce this, but continues to have severe nighttime symptoms. Last week, I offered to start gabapentin which she took but felt very groggy the following day and decided to stop it.  For the past two nights, she has been taking ropinirole 2mg  at bedtime.  She is scheduled to see sleep specialist next week in [place]. She did not tolerate Lyrica in the past.    Medications:  Current Outpatient Medications on File Prior to Visit  Medication Sig Dispense Refill   busPIRone (BUSPAR) 5 MG tablet Take  1 tab 2 times a day     carvedilol (COREG) 6.25 MG tablet Take 6.25 mg by mouth 2 (two) times daily with a meal.     celecoxib (CELEBREX) 200 MG capsule Take 200 mg by mouth daily. Take one capsule at night     estradiol (ESTRACE) 0.5 MG tablet Take 0.5 mg by mouth daily.      famotidine (PEPCID) 40 MG tablet Take 40 mg by mouth daily.     FLUoxetine HCl 60 MG TABS Take 40 mg by mouth daily.     levothyroxine (SYNTHROID) 50 MCG tablet Take 50 mcg by mouth daily before breakfast.     pantoprazole (PROTONIX) 40 MG tablet Take 40 mg by mouth daily.     rOPINIRole (REQUIP) 1 MG tablet Take 2 mg by mouth at bedtime.      acetaminophen (TYLENOL) 650 MG CR tablet Take 650 mg by mouth every 8 (eight) hours as needed for pain. (Patient not taking: Reported on 02/09/2021)     albuterol (VENTOLIN HFA) 108 (90 Base) MCG/ACT inhaler albuterol sulfate HFA 90  mcg/actuation aerosol inhaler (Patient not taking: Reported on 02/09/2021)     aspirin 81 MG chewable tablet Chew by mouth daily. (Patient not taking: Reported on 03/14/2021)     cyclobenzaprine (FLEXERIL) 10 MG tablet TAKE ONE TABLET BY MOUTH 3 TIMES DAILY AS NEEDED FOR MUSCLE SPASMS (Patient not taking: Reported on 02/09/2021) 30 tablet 0   dicyclomine (BENTYL) 10 MG capsule Take 10 mg by mouth every 6 (six) hours as needed. (Patient not taking: Reported on 02/09/2021)     gabapentin (NEURONTIN) 300 MG capsule Take 1 capsule (300 mg total) by mouth at bedtime. (Patient not taking: Reported on 03/14/2021) 30 capsule 5   ondansetron (ZOFRAN) 4 MG tablet Take 4 mg by mouth every 6 (six) hours as needed. (Patient not taking: Reported on 02/09/2021)     predniSONE (DELTASONE) 20 MG tablet Take 20 mg by mouth 2 (two) times daily. (Patient not taking: Reported on 02/09/2021)     promethazine-dextromethorphan (PROMETHAZINE-DM) 6.25-15 MG/5ML syrup SMARTSIG:1 Teaspoon By Mouth Every 6 Hours PRN (Patient not taking: Reported on 02/09/2021)     No current facility-administered medications on file prior to visit.    Allergies:  Allergies  Allergen Reactions   Gabapentin Swelling    From 06/21/2013 OV: Pt thinks it affects her speech some "more thick  tongued".   Amlodipine Swelling   Celecoxib Itching   Clonazepam Other (See Comments)    syncope   Losartan Other (See Comments)    unknown   Mobic [Meloxicam] Itching   Naproxen Sodium Itching   Lisinopril Cough    Vital Signs:  BP (!) 159/87    Pulse 91    Ht 5\' 3"  (1.6 m)    Wt 184 lb (83.5 kg)    SpO2 98%    BMI 32.59 kg/m    Neurological Exam: MENTAL STATUS including orientation to time, place, person, recent and remote memory, attention span and concentration, language, and fund of knowledge is normal.  Speech is not dysarthric.  CRANIAL NERVES:  No visual field defects.  Pupils equal round and reactive to light.  Normal conjugate, extra-ocular eye  movements in all directions of gaze.  No ptosis.  Face is symmetric. Palate elevates symmetrically.  Tongue is midline.  MOTOR:  Motor strength is 5/5 in all extremities.  No atrophy, fasciculations or abnormal movements.  No pronator drift.  Tone is normal.    MSRs:  Reflexes are 2+/4 throughout.  SENSORY:  Intact to vibration throughout.  COORDINATION/GAIT:  Normal finger-to- nose-finger.  Intact rapid alternating movements bilaterally.  Gait narrow based and stable.   Data: n/a  IMPRESSION/PLAN: Restless leg syndrome, worsening and possibly due to augmentation. She was previously taking ropinirole 5mg /d  - Previously tried:  Lyrica (intolerant), gabapentin (intolerant)  - Continue ropinirole 2mg  at bedtime  - Start sinemet 1 tablet at bedtime as needed  - Recommend that she establish care with sleep medicine for ongoing management  Thank you for allowing me to participate in patient's care.  If I can answer any additional questions, I would be pleased to do so.    Sincerely,     K. Posey Pronto, DO

## 2021-03-23 ENCOUNTER — Ambulatory Visit: Payer: Medicare HMO | Admitting: Neurology

## 2021-06-24 IMAGING — MR MRI LUMBAR SPINE WITHOUT CONTRAST
4 of 5 series · 26 of 48 positions shown · non-contrast
Comparison: MRI lumbar spine dated June 24, 2014.

CLINICAL DATA: Chronic low back pain radiating down the back of
both legs.

EXAM:
MRI LUMBAR SPINE WITHOUT CONTRAST
TECHNIQUE: Multiplanar, multisequence MR imaging of the lumbar spine was
performed. No intravenous contrast was administered.

[Series 5: T2 · sagittal · 4.0mm · 0.73mm/px · 6 of 13 slices shown (1 of 2)]
[im 1/13]
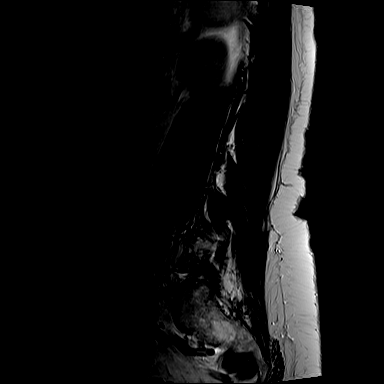
[im 3/13]
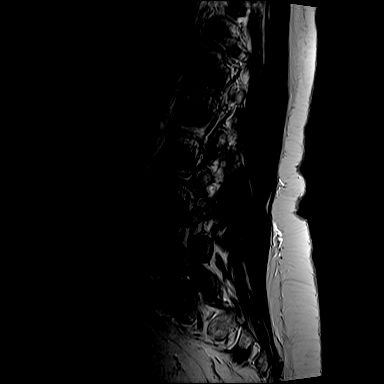
[im 5/13]
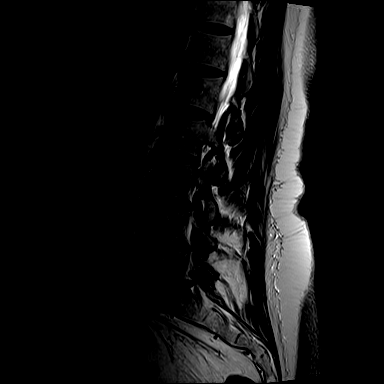
[im 8/13]
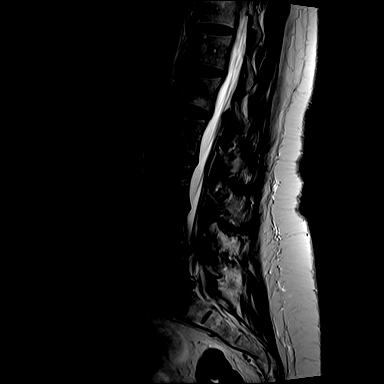
[im 10/13]
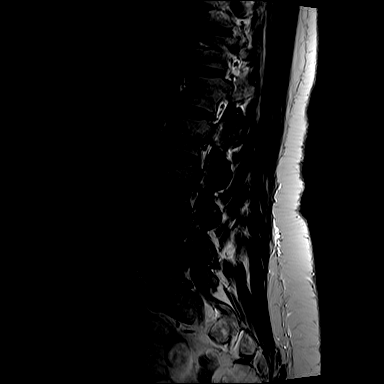
[im 13/13]
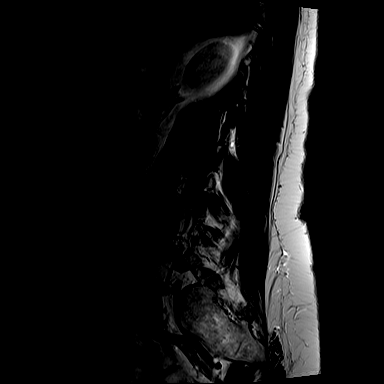

[Series 7: T1 · sagittal · 4.0mm · 0.88mm/px · 6 of 13 slices shown (1 of 2)]
[im 1/13]
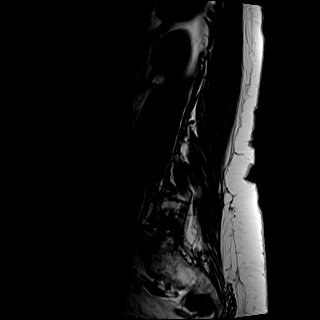
[im 3/13]
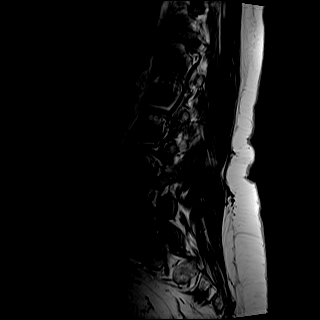
[im 5/13]
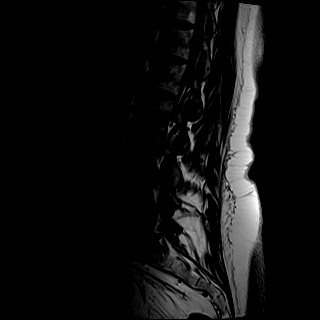
[im 8/13]
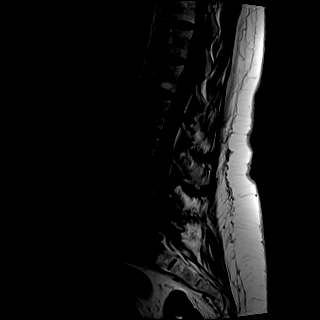
[im 10/13]
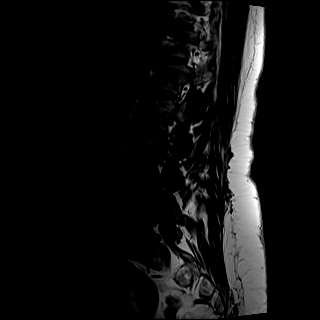
[im 13/13]
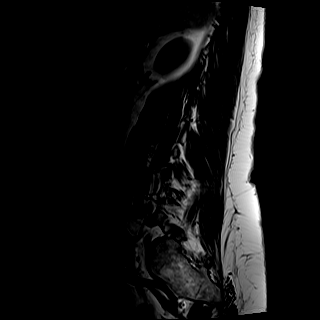

[Series 8: T2 · axial · 4.0mm · 0.57mm/px · z∈[-112,+86]mm · 9 of 32 slices shown (2 of 2)]
[im 1/32]
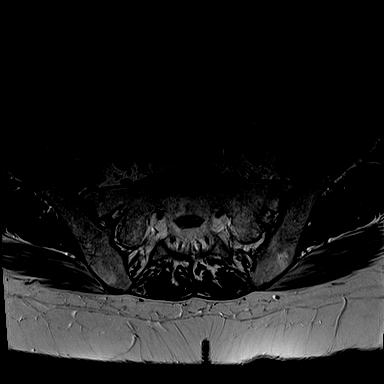
[im 5/32]
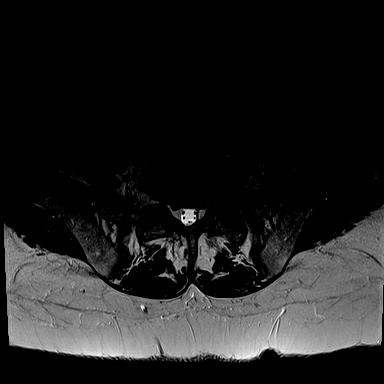
[im 9/32]
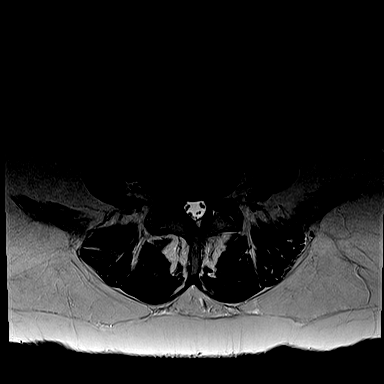
[im 14/32]
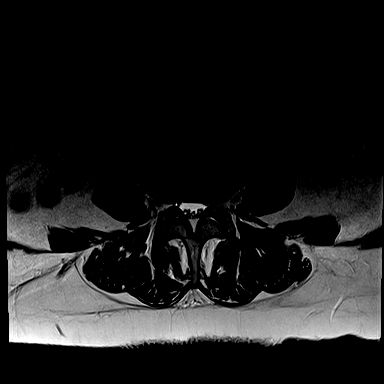
[im 16/32]
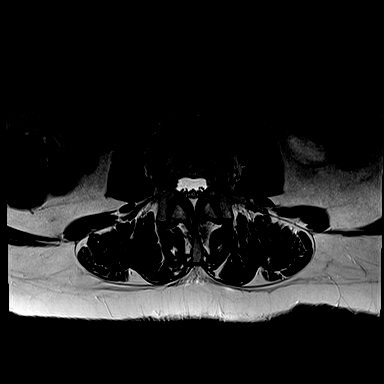
[im 18/32]
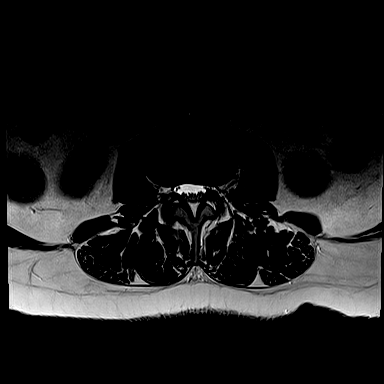
[im 23/32]
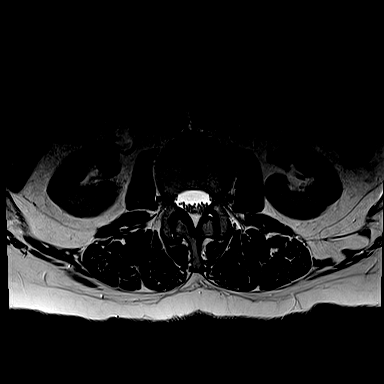
[im 27/32]
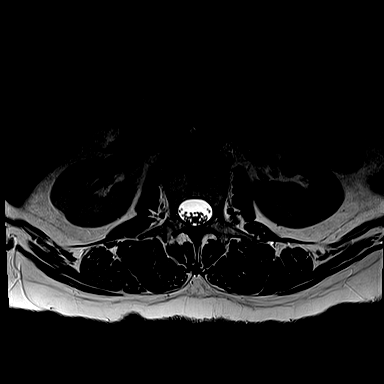
[im 32/32]
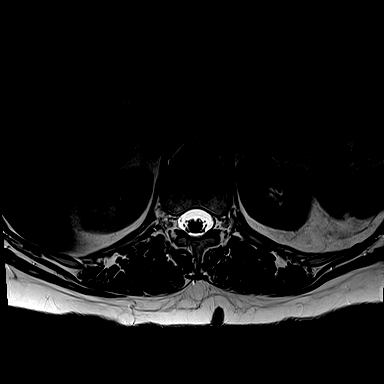

[Series 9: T1 · axial · 4.0mm · 0.34mm/px · z∈[-112,+61]mm · 5 of 32 slices shown (2 of 2)]
[im 1/32]
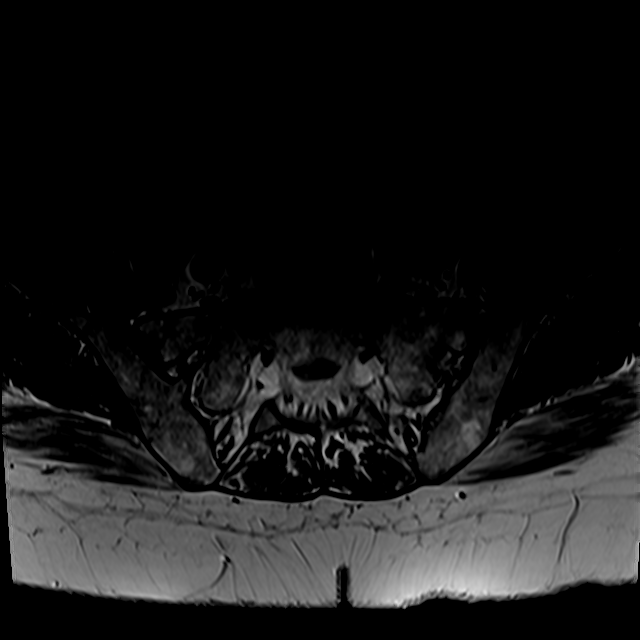
[im 5/32]
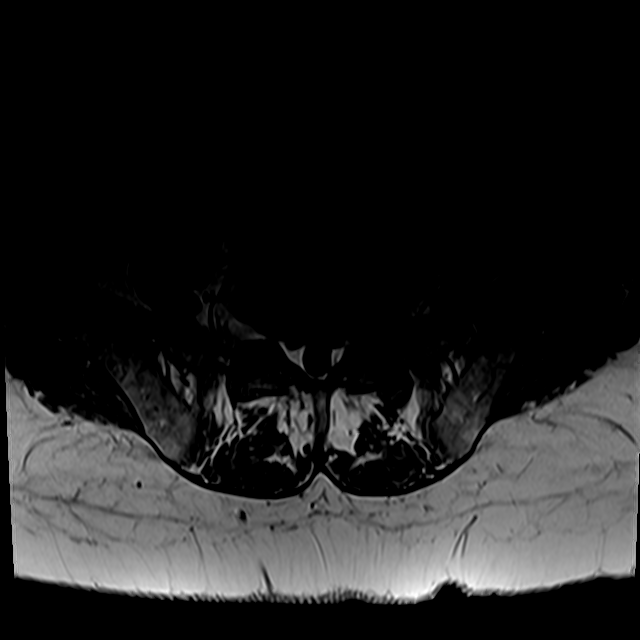
[im 9/32]
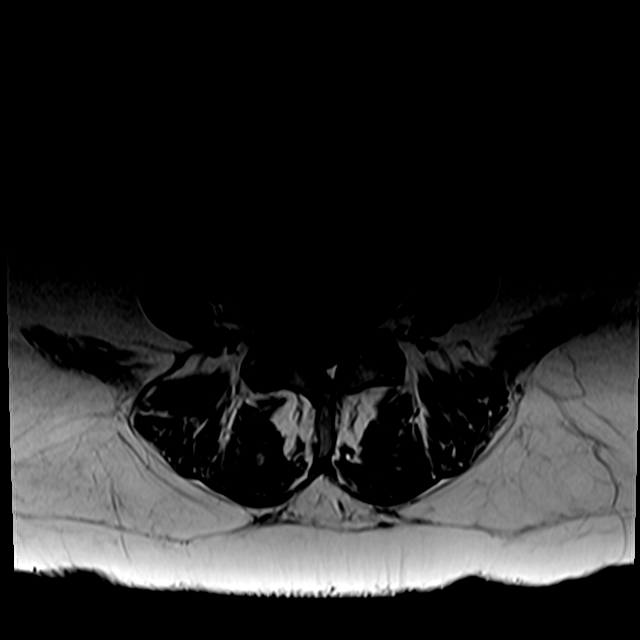
[im 16/32]
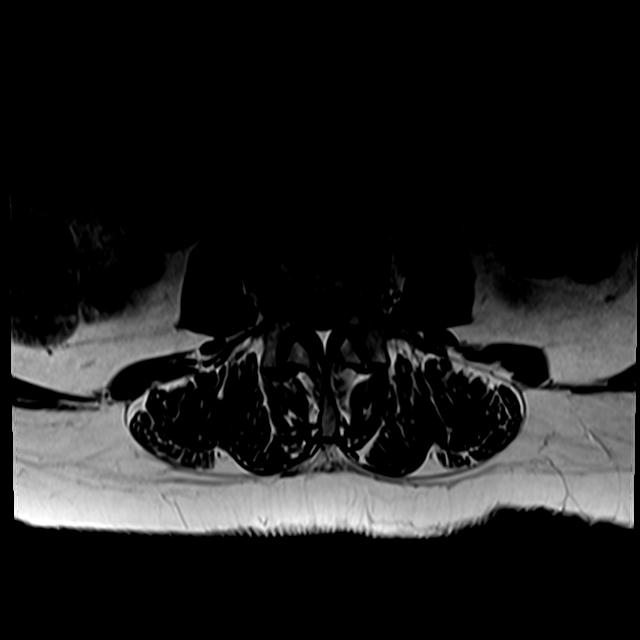
[im 27/32]
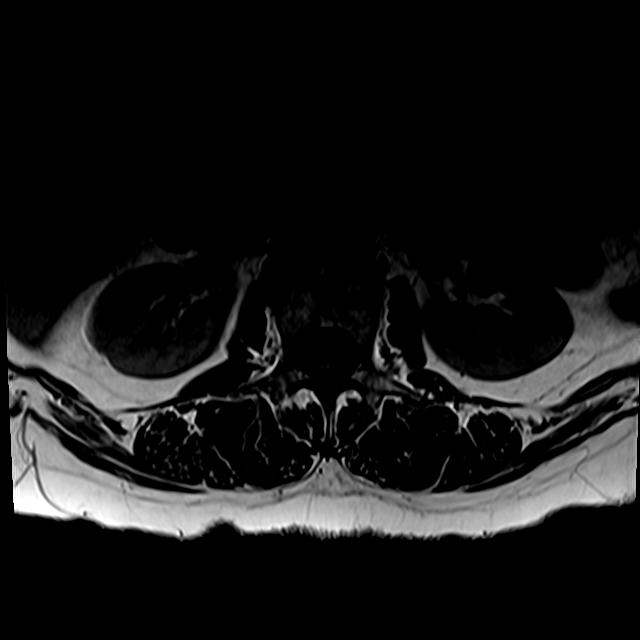

[26 of 48 positions shown; findings below may reference images not displayed]

FINDINGS: Segmentation:  Standard.

Alignment:  Physiologic.

Vertebrae: No acute fracture, evidence of discitis, or bone lesion.
Unchanged small Schmorl's node involving the L1 superior endplate.

Conus medullaris and cauda equina: Conus extends to the L1-L2 level.
Conus and cauda equina appear normal.

Paraspinal and other soft tissues: Negative.

Disc levels:

T12-L1:  Negative.

L1-L2:  New minimal disc bulging.  No stenosis.

L2-L3: Slightly progressive mild disc bulging with new tiny thin
left paracentral disc extrusion migrating superiorly. New borderline
mild left lateral recess stenosis. No spinal canal or neuroforaminal
stenosis.

L3-L4: Unchanged mild disc bulging with small central annular
fissure. Unchanged mild left greater than right lateral recess
stenosis. No spinal canal or neuroforaminal stenosis.

L4-L5: Slightly progressive mild disc bulging eccentric to the right
with right foraminal and far lateral endplate spurring. Unchanged
mild bilateral facet arthropathy. Unchanged mild bilateral lateral
recess stenosis. Slightly progressed mild to moderate right
neuroforaminal stenosis. No spinal canal or left neuroforaminal
stenosis.

L5-S1: Mildly progressive disc height loss and disc bulging with
left far lateral endplate spurring. Unchanged mild bilateral facet
arthropathy. Progressive now moderate to severe left neuroforaminal
stenosis. No spinal canal or right neuroforaminal stenosis.
IMPRESSION: 1. Overall mildly progressed multilevel lumbar spondylosis as
described above. Progressive now moderate to severe left
neuroforaminal stenosis at L5-S1 and mild-to-moderate right
neuroforaminal stenosis at L4-L5.

## 2021-07-18 DIAGNOSIS — M7061 Trochanteric bursitis, right hip: Secondary | ICD-10-CM | POA: Insufficient documentation

## 2021-07-18 HISTORY — DX: Trochanteric bursitis, right hip: M70.61

## 2021-09-27 ENCOUNTER — Other Ambulatory Visit (HOSPITAL_COMMUNITY): Payer: Self-pay | Admitting: Orthopedic Surgery

## 2021-09-27 ENCOUNTER — Other Ambulatory Visit: Payer: Self-pay | Admitting: Orthopedic Surgery

## 2021-09-27 DIAGNOSIS — M25551 Pain in right hip: Secondary | ICD-10-CM

## 2021-10-04 ENCOUNTER — Encounter (HOSPITAL_COMMUNITY): Payer: Self-pay | Admitting: *Deleted

## 2021-10-04 ENCOUNTER — Other Ambulatory Visit: Payer: Self-pay

## 2021-10-04 NOTE — Progress Notes (Addendum)
Kathy [name] denies chest pain or  shortness of breath. Patient denies havin  Kathy [name] 's PCP is Dr. Gilford Rile. Kathy [name] had a sleep study in January of 2023, she has a CPAP, but does not have a mask that works for her.  Kathy [name] states they are working on it.

## 2021-10-09 ENCOUNTER — Ambulatory Visit (HOSPITAL_COMMUNITY): Payer: Medicare HMO | Admitting: Anesthesiology

## 2021-10-09 ENCOUNTER — Ambulatory Visit (HOSPITAL_COMMUNITY)
Admission: RE | Admit: 2021-10-09 | Discharge: 2021-10-09 | Disposition: A | Discharge status: Home / Self Care | Payer: Medicare HMO | Source: Ambulatory Visit | Attending: Orthopedic Surgery | Admitting: Orthopedic Surgery

## 2021-10-09 ENCOUNTER — Ambulatory Visit (HOSPITAL_BASED_OUTPATIENT_CLINIC_OR_DEPARTMENT_OTHER): Payer: Medicare HMO | Admitting: Anesthesiology

## 2021-10-09 ENCOUNTER — Other Ambulatory Visit: Payer: Self-pay

## 2021-10-09 ENCOUNTER — Encounter (HOSPITAL_COMMUNITY): Payer: Self-pay | Admitting: Orthopedic Surgery

## 2021-10-09 ENCOUNTER — Encounter (HOSPITAL_COMMUNITY)
Admission: RE | Disposition: A | Discharge status: Home / Self Care | Payer: Self-pay | Source: Home / Self Care | Attending: Orthopedic Surgery

## 2021-10-09 ENCOUNTER — Ambulatory Visit (HOSPITAL_COMMUNITY)
Admission: RE | Admit: 2021-10-09 | Discharge: 2021-10-09 | Disposition: A | Discharge status: Home / Self Care | Payer: Medicare HMO | Attending: Orthopedic Surgery | Admitting: Orthopedic Surgery

## 2021-10-09 DIAGNOSIS — M1990 Unspecified osteoarthritis, unspecified site: Secondary | ICD-10-CM | POA: Diagnosis not present

## 2021-10-09 DIAGNOSIS — F418 Other specified anxiety disorders: Secondary | ICD-10-CM | POA: Insufficient documentation

## 2021-10-09 DIAGNOSIS — K219 Gastro-esophageal reflux disease without esophagitis: Secondary | ICD-10-CM | POA: Insufficient documentation

## 2021-10-09 DIAGNOSIS — W19XXXA Unspecified fall, initial encounter: Secondary | ICD-10-CM | POA: Insufficient documentation

## 2021-10-09 DIAGNOSIS — I10 Essential (primary) hypertension: Secondary | ICD-10-CM | POA: Diagnosis not present

## 2021-10-09 DIAGNOSIS — J45909 Unspecified asthma, uncomplicated: Secondary | ICD-10-CM | POA: Diagnosis not present

## 2021-10-09 DIAGNOSIS — M797 Fibromyalgia: Secondary | ICD-10-CM | POA: Diagnosis not present

## 2021-10-09 DIAGNOSIS — M25551 Pain in right hip: Secondary | ICD-10-CM

## 2021-10-09 DIAGNOSIS — E039 Hypothyroidism, unspecified: Secondary | ICD-10-CM | POA: Insufficient documentation

## 2021-10-09 HISTORY — DX: Fibromyalgia: M79.7

## 2021-10-09 HISTORY — PX: RADIOLOGY WITH ANESTHESIA: SHX6223

## 2021-10-09 HISTORY — DX: Hypothyroidism, unspecified: E03.9

## 2021-10-09 HISTORY — DX: Pneumonia, unspecified organism: J18.9

## 2021-10-09 SURGERY — MRI WITH ANESTHESIA
Anesthesia: General | Laterality: Right

## 2021-10-09 MED ORDER — PROPOFOL 10 MG/ML IV BOLUS
INTRAVENOUS | Status: DC | PRN
  Administered 2021-10-09: 150 mg via INTRAVENOUS

## 2021-10-09 MED ORDER — MIDAZOLAM HCL 2 MG/2ML IJ SOLN
INTRAMUSCULAR | Status: AC
  Filled 2021-10-09: qty 2

## 2021-10-09 MED ORDER — LACTATED RINGERS IV SOLN: INTRAVENOUS | Status: DC

## 2021-10-09 MED ORDER — DEXMEDETOMIDINE (PRECEDEX) IN NS 20 MCG/5ML (4 MCG/ML) IV SYRINGE
PREFILLED_SYRINGE | INTRAVENOUS | Status: DC | PRN
  Administered 2021-10-09: 8 ug via INTRAVENOUS

## 2021-10-09 MED ORDER — CHLORHEXIDINE GLUCONATE 0.12 % MT SOLN
15.0000 mL | Freq: Once | OROMUCOSAL | Status: AC
Start: 2021-10-09 — End: 2021-10-09
  Administered 2021-10-09: 15 mL via OROMUCOSAL
  Filled 2021-10-09: qty 15

## 2021-10-09 MED ORDER — ORAL CARE MOUTH RINSE: 15.0000 mL | Freq: Once | OROMUCOSAL | Status: AC

## 2021-10-09 MED ORDER — FENTANYL CITRATE (PF) 100 MCG/2ML IJ SOLN
INTRAMUSCULAR | Status: DC | PRN
  Administered 2021-10-09 (×2): 50 ug via INTRAVENOUS

## 2021-10-09 MED ORDER — PHENYLEPHRINE HCL (PRESSORS) 10 MG/ML IV SOLN
INTRAVENOUS | Status: DC | PRN
  Administered 2021-10-09 (×2): 240 ug via INTRAVENOUS

## 2021-10-09 MED ORDER — MIDAZOLAM HCL 5 MG/5ML IJ SOLN
INTRAMUSCULAR | Status: DC | PRN
  Administered 2021-10-09: 2 mg via INTRAVENOUS

## 2021-10-09 MED ORDER — FENTANYL CITRATE (PF) 250 MCG/5ML IJ SOLN
INTRAMUSCULAR | Status: AC
  Filled 2021-10-09: qty 5

## 2021-10-09 MED ORDER — EPHEDRINE SULFATE-NACL 50-0.9 MG/10ML-% IV SOSY
PREFILLED_SYRINGE | INTRAVENOUS | Status: DC | PRN
  Administered 2021-10-09: 10 mg via INTRAVENOUS

## 2021-10-09 NOTE — Transfer of Care (Signed)
Immediate Anesthesia Transfer of Care Note  Patient: Kathy [name]  Procedure(s) Performed: MRI WITH ANESTHESIA (Right)  Patient Location: PACU  Anesthesia Type:General  Level of Consciousness: patient cooperative  Airway & Oxygen Therapy: Patient Spontanous Breathing  Post-op Assessment: Report given to RN and Post -op Vital signs reviewed and stable  Post vital signs: Reviewed and stable  Last Vitals:  Vitals Value Taken Time  BP 127/53 10/09/21 1318  Temp    Pulse 73 10/09/21 1319  Resp 15 10/09/21 1319  SpO2 95 % 10/09/21 1319  Vitals shown include unvalidated device data.  Last Pain:  Vitals:   10/09/21 0922  PainSc: 5          Complications: No notable events documented.

## 2021-10-09 NOTE — Anesthesia Preprocedure Evaluation (Addendum)
Anesthesia Evaluation  Patient identified by MRN, date of birth, ID band Patient awake    Reviewed: Allergy & Precautions, NPO status , Patient's Chart, lab work & pertinent test results, reviewed documented beta blocker date and time   Airway Mallampati: III  TM Distance: >3 FB Neck ROM: Full    Dental  (+) Missing, Dental Advisory Given,    Pulmonary asthma ,    Pulmonary exam normal breath sounds clear to auscultation       Cardiovascular hypertension, Pt. on medications and Pt. on home beta blockers Normal cardiovascular exam Rhythm:Regular Rate:Normal  Stress Test 2022  normal   Neuro/Psych  Headaches, PSYCHIATRIC DISORDERS Anxiety Depression    GI/Hepatic Neg liver ROS, GERD  Medicated,  Endo/Other  Hypothyroidism   Renal/GU negative Renal ROS  negative genitourinary   Musculoskeletal  (+) Arthritis , Fibromyalgia -  Abdominal   Peds  Hematology negative hematology ROS (+)   Anesthesia Other Findings   Reproductive/Obstetrics                            Anesthesia Physical Anesthesia Plan  ASA: 2  Anesthesia Plan: MAC   Post-op Pain Management:    Induction: Intravenous  PONV Risk Score and Plan: 2 and Treatment may vary due to age or medical condition and Midazolam  Airway Management Planned: Natural Airway  Additional Equipment:   Intra-op Plan:   Post-operative Plan:   Informed Consent: I have reviewed the patients History and Physical, chart, labs and discussed the procedure including the risks, benefits and alternatives for the proposed anesthesia with the patient or authorized representative who has indicated his/her understanding and acceptance.     Dental advisory given  Plan Discussed with: CRNA  Anesthesia Plan Comments:        Anesthesia Quick Evaluation

## 2021-10-09 NOTE — Anesthesia Procedure Notes (Signed)
Procedure Name: LMA Insertion Date/Time: 10/09/2021 12:35 PM  Performed by: Georgia Duff, CRNAPre-anesthesia Checklist: Patient identified, Emergency Drugs available, Suction available and Patient being monitored Patient Re-evaluated:Patient Re-evaluated prior to induction Oxygen Delivery Method: Circle System Utilized Preoxygenation: Pre-oxygenation with 100% oxygen Induction Type: IV induction Ventilation: Mask ventilation without difficulty LMA: LMA inserted LMA Size: 4.0 Number of attempts: 1 Airway Equipment and Method: Bite block Placement Confirmation: positive ETCO2 Tube secured with: Tape Dental Injury: Teeth and Oropharynx as per pre-operative assessment

## 2021-10-10 ENCOUNTER — Encounter (HOSPITAL_COMMUNITY): Payer: Self-pay | Admitting: Radiology

## 2021-10-10 NOTE — H&P (Signed)
PREOPERATIVE H&P  Chief Complaint: PAIN IN RIGHT HIP  HPI: Kathy [name] is a 66 y.o. female who presents for preoperative history and physical with a diagnosis of PAIN IN RIGHT HIP. Symptoms are rated as moderate to severe, and have been worsening.  She had a RT HIP IA injection 08-01-21 with Dr. Nelva Bush, which helped significantly with pain relief. Patient reported fall injury 08-11-21, which triggered pain into the back and hip. She is only taking methocarbamol to help with spasms. She has difficulty WB.  Past Medical History:  Diagnosis Date   Aortic atherosclerosis (HCC)    Arthritis    Depression    Fibromyalgia    GERD (gastroesophageal reflux disease)    Hypertension    Hypothyroidism    Pneumonia    Pre-diabetes    Past Surgical History:  Procedure Laterality Date   ABDOMINAL HYSTERECTOMY     CERVICAL DISC SURGERY     x 3   CHOLECYSTECTOMY     COLONOSCOPY     KNEE ARTHROSCOPY     right   POSTERIOR CERVICAL FUSION/FORAMINOTOMY  02/17/2012   Procedure: POSTERIOR CERVICAL FUSION/FORAMINOTOMY LEVEL 2;  Surgeon: Hosie Spangle, MD;  Location: [facility];  Service: Neurosurgery;  Laterality: N/A;  Cervical five to seven posterior arthrodesis   POSTERIOR CERVICAL FUSION/FORAMINOTOMY N/A 03/25/2012   Procedure: POSTERIOR CERVICAL FUSION/FORAMINOTOMY LEVEL 1;  Surgeon: Hosie Spangle, MD;  Location: [place] [facility];  Service: Neurosurgery;  Laterality: N/A;  Cervical five-six posterior cervical arthrodesis and instrumentation    RADIOLOGY WITH ANESTHESIA N/A 08/27/2018   Procedure: MRI LUMBAR SPINE WITHOUT CONTRAST;  Surgeon: Radiologist, Medication, MD;  Location: [place];  Service: Radiology;  Laterality: N/A;   RADIOLOGY WITH ANESTHESIA Right 10/09/2021   Procedure: MRI WITH ANESTHESIA;  Surgeon: Radiologist, Medication, MD;  Location: [place];  Service: Radiology;  Laterality: Right;   SHOULDER DEBRIDEMENT     5- Right, 3 left   SINOSCOPY     TRANSFORAMINAL LUMBAR INTERBODY  FUSION W/ MIS 1 LEVEL Left 07/12/2019   Procedure: Left Lumbar Five- Sacral One minimally invasive transforaminal lumbar interbody fusion;  Surgeon: Judith Part, MD;  Location: [place];  Service: Neurosurgery;  Laterality: Left;  Left Lumbar Five- Sacral One minimally invasive transforaminal lumbar interbody fusion   TUBAL LIGATION     Social History   Socioeconomic History   Marital status: Divorced    Spouse name: Not on file   Number of children: Not on file   Years of education: Not on file   Highest education level: Not on file  Occupational History   Not on file  Tobacco Use   Smoking status: Never   Smokeless tobacco: Never  Vaping Use   Vaping Use: Never used  Substance and Sexual Activity   Alcohol use: Yes    Alcohol/week: 0.0 standard drinks of alcohol    Comment: rarely - , 1 per month   Drug use: No   Sexual activity: Never  Other Topics Concern   Not on file  Social History Narrative   Lives alone.  No children.     Right Handed    Lives in a two story home    Social Determinants of Health   Financial Resource Strain: Not on file  Food Insecurity: Not on file  Transportation Needs: Not on file  Physical Activity: Not on file  Stress: Not on file  Social Connections: Not on file   Family History  Problem Relation Age of  Onset   Heart disease Mother 31   Diabetes Mother    Heart disease Father 110   Diabetes Sister    High blood pressure Brother    High blood pressure Brother    Heart disease Brother 69   Stroke Brother    Diabetes Brother    Heart disease Maternal Grandfather    Heart disease Paternal Grandmother    Heart disease Paternal Grandfather    Colon cancer Maternal Aunt    Heart disease Paternal Uncle    Allergies  Allergen Reactions   Gabapentin Swelling    From 06/21/2013 OV: Pt thinks it affects her speech some "more thick tongued".   Amlodipine Swelling   Clonazepam Other (See Comments)    syncope   Losartan Other (See  Comments)    unknown   Mobic [Meloxicam] Itching   Naproxen Sodium Itching   Lisinopril Cough   Prior to Admission medications   Medication Sig Start Date End Date Taking? Authorizing Provider  acetaminophen (TYLENOL) 650 MG CR tablet Take 1,300 mg by mouth every 8 (eight) hours as needed for pain.   Yes [provider]  ascorbic acid (VITAMIN C) 500 MG tablet Take 500 mg by mouth daily.   Yes [provider]  busPIRone (BUSPAR) 5 MG tablet Take 5 mg by mouth 2 (two) times daily as needed (anxiety). 11/28/20  Yes [provider]  carvedilol (COREG) 6.25 MG tablet Take 6.25 mg by mouth 2 (two) times daily with a meal.   Yes [provider]  celecoxib (CELEBREX) 200 MG capsule Take 200 mg by mouth at bedtime.   Yes [provider]  Cholecalciferol (VITAMIN D3) 125 MCG (5000 UT) CAPS Take 5,000 Units by mouth daily.   Yes [provider]  cyanocobalamin (VITAMIN B12) 1000 MCG tablet Take 1,000 mcg by mouth daily.   Yes [provider]  dicyclomine (BENTYL) 10 MG capsule Take 10 mg by mouth every 6 (six) hours as needed for spasms. 11/11/20  Yes [provider]  estradiol (ESTRACE) 0.5 MG tablet Take 0.5 mg by mouth daily.    Yes [provider]  famotidine (PEPCID) 40 MG tablet Take 40 mg by mouth at bedtime.   Yes [provider]  FLUoxetine (PROZAC) 40 MG capsule Take 40 mg by mouth daily.   Yes [provider]  furosemide (LASIX) 20 MG tablet Take 20 mg by mouth daily as needed for edema.   Yes [provider]  levothyroxine (SYNTHROID) 50 MCG tablet Take 50 mcg by mouth daily before breakfast.   Yes [provider]  loratadine (CLARITIN) 10 MG tablet Take 10 mg by mouth at bedtime.   Yes [provider]  montelukast (SINGULAIR) 10 MG tablet Take 10 mg by mouth daily. 09/28/21  Yes [provider]  omeprazole (PRILOSEC) 40 MG capsule Take 40 mg by mouth every  morning. 08/28/21  Yes [provider]  rOPINIRole (REQUIP) 1 MG tablet Take 2-3 mg by mouth at bedtime. 08/24/18  Yes [provider]  cyclobenzaprine (FLEXERIL) 10 MG tablet TAKE ONE TABLET BY MOUTH 3 TIMES DAILY AS NEEDED FOR MUSCLE SPASMS 02/08/21   Landis Martins, DPM     Positive ROS: All other systems have been reviewed and were otherwise negative with the exception of those mentioned in the HPI and as above.  Physical Exam: General: Alert, no acute distress Cardiovascular: No pedal edema Respiratory: No cyanosis, no use of accessory musculature GI: No organomegaly, abdomen is  soft and non-tender Skin: No lesions in the area of chief complaint Neurologic: Sensation intact distally Psychiatric: Patient is competent for consent with normal mood and affect Lymphatic: No axillary or cervical lymphadenopathy  MUSCULOSKELETAL: Examination of the right hip reveals no skin wounds or lesions. She does have trochanteric tenderness to palpation. Mildly restricted range of motion of the right hip. Pain with terminal flexion and rotation. Pain in the position of impingement.  Neurovascular intact distally.  Assessment: PAIN IN RIGHT HIP Status post recent fall with increasing right hip pain and difficulty weightbearing. Right hip osteoarthritis. Trochanteric bursitis right hip. Postlaminectomy syndrome with low back pain and radicular leg pain.  Plan: Plan for Procedure(s): MRI WITH ANESTHESIA  I discussed the findings with the patient. The intra-articular injection gave her excellent relief, until she fell. Her hip is now hurting worse. She is having difficulty weightbearing. We went over her x-rays, which do not demonstrate any acute change. At this point, I recommend MRI of the right hip to rule out occult fracture. Recommend protected weightbearing with a walker for now. Prescription provided for walker. Follow-up once MRI is done, we will go over the results  face-to-face. All questions were solicited and answered.  Bertram Savin, MD [phone]   10/10/2021 2:00 PM

## 2021-10-10 NOTE — Anesthesia Postprocedure Evaluation (Signed)
Anesthesia Post Note  Patient: Kathy [name]  Procedure(s) Performed: MRI WITH ANESTHESIA (Right)     Patient location during evaluation: PACU Anesthesia Type: General Level of consciousness: awake and alert Pain management: pain level controlled Vital Signs Assessment: post-procedure vital signs reviewed and stable Respiratory status: spontaneous breathing, nonlabored ventilation, respiratory function stable and patient connected to nasal cannula oxygen Cardiovascular status: blood pressure returned to baseline and stable Postop Assessment: no apparent nausea or vomiting Anesthetic complications: no   No notable events documented.  Last Vitals:  Vitals:   10/09/21 1330 10/09/21 1340  BP: 135/74 125/66  Pulse: 71 74  Resp: 17 20  Temp:  36.6 C  SpO2: 95% 93%    Last Pain:  Vitals:   10/09/21 1340  PainSc: 0-No pain   Pain Goal:                    L 

## 2021-10-18 ENCOUNTER — Other Ambulatory Visit (HOSPITAL_COMMUNITY): Payer: Self-pay | Admitting: Neurological Surgery

## 2021-10-18 ENCOUNTER — Other Ambulatory Visit: Payer: Self-pay | Admitting: Neurological Surgery

## 2021-10-18 DIAGNOSIS — M5416 Radiculopathy, lumbar region: Secondary | ICD-10-CM

## 2021-11-26 ENCOUNTER — Encounter (HOSPITAL_COMMUNITY): Payer: Self-pay | Admitting: *Deleted

## 2021-11-26 ENCOUNTER — Other Ambulatory Visit: Payer: Self-pay

## 2021-11-26 NOTE — Progress Notes (Addendum)
Ms Mittleman denies chest pain or shortness of breath. Patient denies having any s/s of Covid in her household, also denies any known exposure to Covid.   Ms Giovanelli was seen on 11/06/21 with upper respitory symptoms, patient was negativef or Covid and flu.  Patient was treated with Decadron, she  has no respiratory symptoms at this time.  Ms Patchin's PCP is Dr. Gilford Rile.

## 2021-11-27 ENCOUNTER — Ambulatory Visit (HOSPITAL_COMMUNITY)
Admission: RE | Admit: 2021-11-27 | Discharge: 2021-11-27 | Disposition: A | Discharge status: Home / Self Care | Payer: Medicare HMO | Source: Ambulatory Visit | Attending: Neurological Surgery | Admitting: Neurological Surgery

## 2021-11-27 ENCOUNTER — Encounter (HOSPITAL_COMMUNITY)
Admission: RE | Disposition: A | Discharge status: Home / Self Care | Payer: Self-pay | Source: Ambulatory Visit | Attending: Neurological Surgery

## 2021-11-27 ENCOUNTER — Ambulatory Visit (HOSPITAL_COMMUNITY): Payer: Medicare HMO

## 2021-11-27 ENCOUNTER — Encounter (HOSPITAL_COMMUNITY): Payer: Self-pay | Admitting: Neurological Surgery

## 2021-11-27 ENCOUNTER — Ambulatory Visit (HOSPITAL_BASED_OUTPATIENT_CLINIC_OR_DEPARTMENT_OTHER): Payer: Medicare HMO

## 2021-11-27 DIAGNOSIS — M48061 Spinal stenosis, lumbar region without neurogenic claudication: Secondary | ICD-10-CM | POA: Insufficient documentation

## 2021-11-27 DIAGNOSIS — M2578 Osteophyte, vertebrae: Secondary | ICD-10-CM | POA: Diagnosis not present

## 2021-11-27 DIAGNOSIS — M5416 Radiculopathy, lumbar region: Secondary | ICD-10-CM

## 2021-11-27 DIAGNOSIS — M5136 Other intervertebral disc degeneration, lumbar region: Secondary | ICD-10-CM | POA: Diagnosis not present

## 2021-11-27 DIAGNOSIS — Z981 Arthrodesis status: Secondary | ICD-10-CM | POA: Diagnosis not present

## 2021-11-27 HISTORY — DX: Anxiety disorder, unspecified: F41.9

## 2021-11-27 HISTORY — PX: RADIOLOGY WITH ANESTHESIA: SHX6223

## 2021-11-27 HISTORY — DX: Dyspnea, unspecified: R06.00

## 2021-11-27 LAB — BASIC METABOLIC PANEL
Anion gap: 6 (ref 5–15)
BUN: 16 mg/dL (ref 8–23)
CO2: 23 mmol/L (ref 22–32)
Calcium: 8.3 mg/dL — ABNORMAL LOW (ref 8.9–10.3)
Chloride: 107 mmol/L (ref 98–111)
Creatinine, Ser: 0.75 mg/dL (ref 0.44–1.00)
GFR, Estimated: >60 mL/min (ref >60)
Glucose, Bld: 88 mg/dL (ref 70–99)
Potassium: 4.1 mmol/L (ref 3.5–5.1)
Sodium: 136 mmol/L (ref 135–145)

## 2021-11-27 SURGERY — MRI WITH ANESTHESIA
Anesthesia: General

## 2021-11-27 MED ORDER — LIDOCAINE 2% (20 MG/ML) 5 ML SYRINGE
INTRAMUSCULAR | Status: DC | PRN
  Administered 2021-11-27: 40 mg via INTRAVENOUS

## 2021-11-27 MED ORDER — PHENYLEPHRINE 80 MCG/ML (10ML) SYRINGE FOR IV PUSH (FOR BLOOD PRESSURE SUPPORT)
PREFILLED_SYRINGE | INTRAVENOUS | Status: DC | PRN
  Administered 2021-11-27 (×2): 160 ug via INTRAVENOUS

## 2021-11-27 MED ORDER — FENTANYL CITRATE (PF) 100 MCG/2ML IJ SOLN
INTRAMUSCULAR | Status: DC | PRN
  Administered 2021-11-27 (×2): 50 ug via INTRAVENOUS

## 2021-11-27 MED ORDER — ROCURONIUM BROMIDE 10 MG/ML (PF) SYRINGE
PREFILLED_SYRINGE | INTRAVENOUS | Status: DC | PRN
  Administered 2021-11-27: 70 mg via INTRAVENOUS

## 2021-11-27 MED ORDER — CHLORHEXIDINE GLUCONATE 0.12 % MT SOLN
15.0000 mL | OROMUCOSAL | Status: AC
  Filled 2021-11-27: qty 15

## 2021-11-27 MED ORDER — CHLORHEXIDINE GLUCONATE 0.12 % MT SOLN
OROMUCOSAL | Status: AC
  Administered 2021-11-27: 15 mL via OROMUCOSAL
  Filled 2021-11-27: qty 15

## 2021-11-27 MED ORDER — EPHEDRINE SULFATE-NACL 50-0.9 MG/10ML-% IV SOSY
PREFILLED_SYRINGE | INTRAVENOUS | Status: DC | PRN
  Administered 2021-11-27 (×2): 10 mg via INTRAVENOUS
  Administered 2021-11-27: 5 mg via INTRAVENOUS

## 2021-11-27 MED ORDER — ONDANSETRON HCL 4 MG/2ML IJ SOLN
INTRAMUSCULAR | Status: DC | PRN
  Administered 2021-11-27: 4 mg via INTRAVENOUS

## 2021-11-27 MED ORDER — PROPOFOL 10 MG/ML IV BOLUS
INTRAVENOUS | Status: DC | PRN
  Administered 2021-11-27: 170 mg via INTRAVENOUS

## 2021-11-27 MED ORDER — DEXAMETHASONE SODIUM PHOSPHATE 10 MG/ML IJ SOLN
INTRAMUSCULAR | Status: DC | PRN
  Administered 2021-11-27: 4 mg via INTRAVENOUS

## 2021-11-27 MED ORDER — SUGAMMADEX SODIUM 200 MG/2ML IV SOLN
INTRAVENOUS | Status: DC | PRN
  Administered 2021-11-27: 200 mg via INTRAVENOUS

## 2021-11-27 MED ORDER — LACTATED RINGERS IV SOLN: INTRAVENOUS | Status: DC

## 2021-11-27 NOTE — Transfer of Care (Signed)
Immediate Anesthesia Transfer of Care Note  Patient: Kathy [name]  Procedure(s) Performed: LUMBER SPINE WITHOUT CONTRAST  Patient Location: PACU  Anesthesia Type:General  Level of Consciousness: awake, alert  and oriented  Airway & Oxygen Therapy: Patient Spontanous Breathing and Patient connected to nasal cannula oxygen  Post-op Assessment: Report given to RN, Post -op Vital signs reviewed and stable and Patient moving all extremities  Post vital signs: Reviewed and stable  Last Vitals:  Vitals Value Taken Time  BP 142/66 11/27/21 1301  Temp 36.7 C 11/27/21 1255  Pulse 81 11/27/21 1303  Resp 17 11/27/21 1303  SpO2 91 % 11/27/21 1303  Vitals shown include unvalidated device data.  Last Pain:  Vitals:   11/27/21 1255  TempSrc:   PainSc: 0-No pain      Patients Stated Pain Goal: 0 (57/47/34 0370)  Complications: No notable events documented.

## 2021-11-27 NOTE — Anesthesia Procedure Notes (Signed)
Procedure Name: Intubation Date/Time: 11/27/2021 11:58 AM  Performed by: Leonor Liv, CRNAPre-anesthesia Checklist: Patient identified, Emergency Drugs available, Suction available and Patient being monitored Patient Re-evaluated:Patient Re-evaluated prior to induction Oxygen Delivery Method: Circle System Utilized Preoxygenation: Pre-oxygenation with 100% oxygen Induction Type: IV induction Ventilation: Mask ventilation without difficulty Laryngoscope Size: Glidescope and 3 Grade View: Grade I Tube type: Oral Tube size: 7.0 mm Number of attempts: 1 Airway Equipment and Method: Stylet and Oral airway Placement Confirmation: ETT inserted through vocal cords under direct vision, positive ETCO2 and breath sounds checked- equal and bilateral Secured at: 21 cm Tube secured with: Tape Dental Injury: Teeth and Oropharynx as per pre-operative assessment  Comments: SRNA K Venrick

## 2021-11-28 ENCOUNTER — Encounter (HOSPITAL_COMMUNITY): Payer: Self-pay | Admitting: Radiology

## 2021-11-28 NOTE — Anesthesia Postprocedure Evaluation (Signed)
Anesthesia Post Note  Patient: Kathy [name]  Procedure(s) Performed: LUMBER SPINE WITHOUT CONTRAST     Patient location during evaluation: PACU Anesthesia Type: General Level of consciousness: awake and alert Pain management: pain level controlled Vital Signs Assessment: post-procedure vital signs reviewed and stable Respiratory status: spontaneous breathing, nonlabored ventilation and respiratory function stable Cardiovascular status: blood pressure returned to baseline and stable Postop Assessment: no apparent nausea or vomiting Anesthetic complications: no   No notable events documented.  Last Vitals:  Vitals:   11/27/21 1315 11/27/21 1329  BP: (!) 146/62 (!) 150/67  Pulse: 80 76  Resp: 20 (!) 24  Temp:  36.7 C  SpO2: 94% 93%    Last Pain:  Vitals:   11/27/21 1255  TempSrc:   PainSc: 0-No pain                  

## 2021-11-28 NOTE — Anesthesia Preprocedure Evaluation (Signed)
Anesthesia Evaluation  Patient identified by MRN, date of birth, ID band Patient awake    Reviewed: Allergy & Precautions, NPO status , Patient's Chart, lab work & pertinent test results, reviewed documented beta blocker date and time   Airway Mallampati: III  TM Distance: >3 FB Neck ROM: Full    Dental  (+) Missing, Dental Advisory Given,    Pulmonary asthma ,    Pulmonary exam normal breath sounds clear to auscultation       Cardiovascular hypertension, Pt. on medications and Pt. on home beta blockers Normal cardiovascular exam Rhythm:Regular Rate:Normal  Stress Test 2022  normal   Neuro/Psych  Headaches, PSYCHIATRIC DISORDERS Anxiety Depression    GI/Hepatic Neg liver ROS, GERD  Medicated,  Endo/Other  Hypothyroidism   Renal/GU negative Renal ROS  negative genitourinary   Musculoskeletal  (+) Arthritis , Fibromyalgia -  Abdominal   Peds  Hematology negative hematology ROS (+)   Anesthesia Other Findings   Reproductive/Obstetrics                             Anesthesia Physical Anesthesia Plan  ASA: 2  Anesthesia Plan: General   Post-op Pain Management: Minimal or no pain anticipated   Induction: Intravenous  PONV Risk Score and Plan: 3 and Dexamethasone and Ondansetron  Airway Management Planned: Oral ETT  Additional Equipment: None  Intra-op Plan:   Post-operative Plan: Extubation in OR  Informed Consent: I have reviewed the patients History and Physical, chart, labs and discussed the procedure including the risks, benefits and alternatives for the proposed anesthesia with the patient or authorized representative who has indicated his/her understanding and acceptance.     Dental advisory given  Plan Discussed with: CRNA  Anesthesia Plan Comments:         Anesthesia Quick Evaluation

## 2022-05-24 DIAGNOSIS — M47816 Spondylosis without myelopathy or radiculopathy, lumbar region: Secondary | ICD-10-CM

## 2022-05-24 HISTORY — DX: Spondylosis without myelopathy or radiculopathy, lumbar region: M47.816

## 2022-06-18 ENCOUNTER — Encounter: Payer: Self-pay | Admitting: Cardiology

## 2022-06-18 ENCOUNTER — Ambulatory Visit: Payer: Medicare HMO | Attending: Cardiology | Admitting: Cardiology

## 2022-06-18 VITALS — BP 132/72 | HR 82 | Ht 63.0 in | Wt 173.2 lb

## 2022-06-18 DIAGNOSIS — I6523 Occlusion and stenosis of bilateral carotid arteries: Secondary | ICD-10-CM | POA: Diagnosis not present

## 2022-06-18 DIAGNOSIS — R072 Precordial pain: Secondary | ICD-10-CM | POA: Diagnosis not present

## 2022-06-18 DIAGNOSIS — I700 Atherosclerosis of aorta: Secondary | ICD-10-CM

## 2022-06-18 DIAGNOSIS — R0789 Other chest pain: Secondary | ICD-10-CM

## 2022-06-18 DIAGNOSIS — R7303 Prediabetes: Secondary | ICD-10-CM

## 2022-06-18 DIAGNOSIS — I10 Essential (primary) hypertension: Secondary | ICD-10-CM | POA: Diagnosis not present

## 2022-06-18 DIAGNOSIS — R0609 Other forms of dyspnea: Secondary | ICD-10-CM

## 2022-06-18 HISTORY — DX: Other chest pain: R07.89

## 2022-06-18 MED ORDER — METOPROLOL TARTRATE 100 MG PO TABS: 100.0000 mg | ORAL_TABLET | Freq: Once | ORAL | 0 refills | Status: DC

## 2022-06-18 NOTE — Addendum Note (Signed)
Addended by: Roosvelt Harps R on: 06/18/2022 01:53 PM   Modules accepted: Orders

## 2022-06-18 NOTE — Patient Instructions (Addendum)
Medication Instructions:  Your physician recommends that you continue on your current medications as directed. Please refer to the Current Medication list given to you today.  *If you need a refill on your cardiac medications before your next appointment, please call your pharmacy*   Lab Work: Your physician recommends that you return for lab work in:   Labs 1 week before CT: BMP  If you have labs (blood work) drawn today and your tests are completely normal, you will receive your results only by: MyChart Message (if you have MyChart) OR A paper copy in the mail If you have any lab test that is abnormal or we need to change your treatment, we will call you to review the results.   Testing/Procedures:   Your cardiac CT will be scheduled at one of the below locations:   [facility] [address] [place], [place] [address] [phone]  OR  [facility] [address] [place], [place] [address] [phone]  OR   [facility] [address] [place], [place] [address] [phone]  If scheduled at [facility], please arrive at the [facility] and Children's Entrance (Entrance C2) of [facility] 30 minutes prior to test start time. You can use the FREE valet parking offered at entrance C (encouraged to control the heart rate for the test)  Proceed to the [facility] Radiology Department (first floor) to check-in and test prep.  All radiology patients and guests should use entrance C2 at [facility], accessed from [facility], even though the hospital's physical address listed is [address]    If scheduled at [facility] or [facility], please arrive 15 mins early for check-in and test prep.   Please follow these instructions carefully (unless otherwise directed):  On the Night Before the Test: Be  sure to Drink plenty of water. Do not consume any caffeinated/decaffeinated beverages or chocolate 12 hours prior to your test. Do not take any antihistamines 12 hours prior to your test.  On the Day of the Test: Drink plenty of water until 1 hour prior to the test. Do not eat any food 1 hour prior to test. You may take your regular medications prior to the test.  Take metoprolol (Lopressor) two hours prior to test. If you take Hydrochlorothiazide please HOLD on the morning of the test. FEMALES- please wear underwire-free bra if available, avoid dresses & tight clothing       After the Test: Drink plenty of water. After receiving IV contrast, you may experience a mild flushed feeling. This is normal. On occasion, you may experience a mild rash up to 24 hours after the test. This is not dangerous. If this occurs, you can take Benadryl 25 mg and increase your fluid intake. If you experience trouble breathing, this can be serious. If it is severe call 911 IMMEDIATELY. If it is mild, please call our office. If you take any of these medications: Glipizide/Metformin, Avandament, Glucavance, please do not take 48 hours after completing test unless otherwise instructed.  We will call to schedule your test 2-4 weeks out understanding that some insurance companies will need an authorization prior to the service being performed.     Follow-Up: At [facility], you and your health needs are our priority.  As part of our continuing mission to provide you with exceptional heart care, we have created designated Provider Care  Teams.  These Care Teams include your primary Cardiologist (physician) and Advanced Practice Providers (APPs -  Physician Assistants and Nurse Practitioners) who all work together to provide you with the care you need, when you need it.  We recommend signing up for the patient portal called "MyChart".  Sign up information is provided on this After Visit Summary.  MyChart is  used to connect with patients for Virtual Visits (Telemedicine).  Patients are able to view lab/test results, encounter notes, upcoming appointments, etc.  Non-urgent messages can be sent to your provider as well.   To learn more about what you can do with MyChart, go to [email or web address].    Your next appointment:   3 month(s)  Provider:   Gypsy Balsam, MD    Other Instructions Patient declined EKG chaperone

## 2022-06-18 NOTE — Progress Notes (Signed)
Cardiology Office Note:    Date:  06/18/2022   ID:  Kathy [name], DOB 22-Sep-1955, MRN [id number]  PCP:  Audie Pinto, FNP  Cardiologist:  Gypsy Balsam, MD    Referring MD: Gordan Payment., MD   Chief Complaint  Patient presents with   Shortness of Breath        Abnormal Echo    History of Present Illness:    Kathy [name] is a 67 y.o. female past medical history significant for aortic atherosclerosis, atherosclerosis of carotic arteries, chronic lung condition followed by pulmonary, she was referred to [place] because of his normal echocardiogram.  She did have echocardiogram done by pulmonary clinic which showed normal left ventricle ejection fraction, trivial tricuspid regurgitation with normal pulm artery pressure.  Patient is some alarmed by that and would like to have discussion about it what triggered this evaluation his shortness of breath we still have no good explanation for it.  She a complaint of having some tightness in the chest when she gets short of breath as well in 2022 she did have stress test which showed no evidence of ischemia, it was plan EKG treadmill stress test.  She denies have any palpitations no dizziness no recent swelling of lower extremities.  Past Medical History:  Diagnosis Date   Anxiety    Aortic atherosclerosis (HCC)    Arthritis    Depression    Dyspnea    Fibromyalgia    GERD (gastroesophageal reflux disease)    Hypertension    Hypothyroidism    Pneumonia    Pre-diabetes     Past Surgical History:  Procedure Laterality Date   ABDOMINAL HYSTERECTOMY     CERVICAL DISC SURGERY     x 3   CHOLECYSTECTOMY     COLONOSCOPY     KNEE ARTHROSCOPY     right   POSTERIOR CERVICAL FUSION/FORAMINOTOMY  02/17/2012   Procedure: POSTERIOR CERVICAL FUSION/FORAMINOTOMY LEVEL 2;  Surgeon: Hewitt Shorts, MD;  Location: [facility];  Service: Neurosurgery;  Laterality: N/A;  Cervical five to seven posterior arthrodesis   POSTERIOR CERVICAL  FUSION/FORAMINOTOMY N/A 03/25/2012   Procedure: POSTERIOR CERVICAL FUSION/FORAMINOTOMY LEVEL 1;  Surgeon: Hewitt Shorts, MD;  Location: [facility]UR[facility];  Service: Neurosurgery;  Laterality: N/A;  Cervical five-six posterior cervical arthrodesis and instrumentation    RADIOLOGY WITH ANESTHESIA N/A 08/27/2018   Procedure: MRI LUMBAR SPINE WITHOUT CONTRAST;  Surgeon: Radiologist, Medication, MD;  Location: [facility] OR;  Service: Radiology;  Laterality: N/A;   RADIOLOGY WITH ANESTHESIA Right 10/09/2021   Procedure: MRI WITH ANESTHESIA;  Surgeon: Radiologist, Medication, MD;  Location: [facility] OR;  Service: Radiology;  Laterality: Right;   RADIOLOGY WITH ANESTHESIA N/A 11/27/2021   Procedure: LUMBER SPINE WITHOUT CONTRAST;  Surgeon: Radiologist, Medication, MD;  Location: [facility] OR;  Service: Radiology;  Laterality: N/A;   SHOULDER DEBRIDEMENT     5- Right, 3 left   SINOSCOPY     TRANSFORAMINAL LUMBAR INTERBODY FUSION W/ MIS 1 LEVEL Left 07/12/2019   Procedure: Left Lumbar Five- Sacral One minimally invasive transforaminal lumbar interbody fusion;  Surgeon: Jadene Pierini, MD;  Location: [facility] OR;  Service: Neurosurgery;  Laterality: Left;  Left Lumbar Five- Sacral One minimally invasive transforaminal lumbar interbody fusion   TUBAL LIGATION      Current Medications: Current Meds  Medication Sig   acetaminophen (TYLENOL) 650 MG CR tablet Take 1,300 mg by mouth every 8 (eight) hours as needed for pain.   busPIRone (BUSPAR) 5  MG tablet Take 5 mg by mouth 2 (two) times daily as needed (anxiety).   carvedilol (COREG) 6.25 MG tablet Take 6.25 mg by mouth 2 (two) times daily with a meal.   celecoxib (CELEBREX) 200 MG capsule Take 200 mg by mouth at bedtime.   CYANOCOBALAMIN IJ Inject 1 each as directed every 30 (thirty) days.   cyclobenzaprine (FLEXERIL) 10 MG tablet TAKE ONE TABLET BY MOUTH 3 TIMES DAILY AS NEEDED FOR MUSCLE SPASMS (Patient taking differently: Take 10 mg by mouth 3 (three) times daily as needed for  muscle spasms.)   dexamethasone (DECADRON) 4 MG tablet Take 4 mg by mouth 2 (two) times daily with a meal. Completed course   dicyclomine (BENTYL) 10 MG capsule Take 10 mg by mouth every 6 (six) hours as needed for spasms.   estradiol (ESTRACE) 0.5 MG tablet Take 0.5 mg by mouth daily.    famotidine (PEPCID) 40 MG tablet Take 40 mg by mouth at bedtime.   FLUoxetine (PROZAC) 20 MG capsule Take 20 mg by mouth at bedtime.   FLUoxetine (PROZAC) 40 MG capsule Take 40 mg by mouth every morning.   fluticasone (FLONASE) 50 MCG/ACT nasal spray Place 2 sprays into both nostrils 2 (two) times daily.   hydrochlorothiazide (HYDRODIURIL) 12.5 MG tablet Take 12.5 mg by mouth daily.   levothyroxine (SYNTHROID) 50 MCG tablet Take 50 mcg by mouth daily before breakfast.   loratadine-pseudoephedrine (CLARITIN-D 24-HOUR) 10-240 MG 24 hr tablet Take 1 tablet by mouth at bedtime.   LORazepam (ATIVAN) 0.5 MG tablet Take 0.5 mg by mouth 2 (two) times daily as needed (restless leg).   methocarbamol (ROBAXIN) 500 MG tablet Take 500 mg by mouth 2 (two) times daily as needed (restless leg).   metoprolol tartrate (LOPRESSOR) 100 MG tablet Take 1 tablet (100 mg total) by mouth once for 1 dose. Please take this medication 2 hours before CT   montelukast (SINGULAIR) 10 MG tablet Take 10 mg by mouth daily.   omeprazole (PRILOSEC) 40 MG capsule Take 40 mg by mouth daily before breakfast.   rOPINIRole (REQUIP) 1 MG tablet Take 1-2 mg by mouth See admin instructions. Take 1 mg in the morning and 1 mg at evening and 2 mg at bedtime   traMADol (ULTRAM) 50 MG tablet Take 50 mg by mouth every 6 (six) hours as needed (sleep).   [DISCONTINUED] acetaminophen (TYLENOL) 650 MG CR tablet Take 650 mg by mouth every 8 (eight) hours as needed for pain.   [DISCONTINUED] Cholecalciferol (VITAMIN D3) 125 MCG (5000 UT) CAPS Take 5,000 Units by mouth daily.   [DISCONTINUED] furosemide (LASIX) 20 MG tablet Take 20 mg by mouth daily as needed for  edema.     Allergies:   Gabapentin, Amlodipine, Clonazepam, Losartan, Mobic [meloxicam], Naproxen sodium, and Lisinopril   Social History   Socioeconomic History   Marital status: Divorced    Spouse name: Not on file   Number of children: Not on file   Years of education: Not on file   Highest education level: Not on file  Occupational History   Not on file  Tobacco Use   Smoking status: Never   Smokeless tobacco: Never  Vaping Use   Vaping Use: Never used  Substance and Sexual Activity   Alcohol use: Yes    Alcohol/week: 0.0 standard drinks of alcohol    Comment: rarely - , 1 per month   Drug use: No   Sexual activity: Never  Other Topics Concern   Not  on file  Social History Narrative   Lives alone.  No children.     Right Handed    Lives in a two story home    Social Determinants of Health   Financial Resource Strain: Not on file  Food Insecurity: Not on file  Transportation Needs: Not on file  Physical Activity: Not on file  Stress: Not on file  Social Connections: Not on file     Family History: The patient's family history includes Colon cancer in her maternal aunt; Diabetes in her brother, mother, and sister; Heart disease in her maternal grandfather, paternal grandfather, paternal grandmother, and paternal uncle; Heart disease (age of onset: 59) in her brother; Heart disease (age of onset: 52) in her mother; Heart disease (age of onset: 70) in her father; High blood pressure in her brother and brother; Stroke in her brother. ROS:   Please see the history of present illness.    All 14 point review of systems negative except as described per history of present illness  EKGs/Labs/Other Studies Reviewed:      Recent Labs: 11/27/2021: BUN 16; Creatinine, Ser 0.75; Potassium 4.1; Sodium 136  Recent Lipid Panel No results found for: "CHOL", "TRIG", "HDL", "CHOLHDL", "VLDL", "LDLCALC", "LDLDIRECT"  Physical Exam:    VS:  BP 132/72 (BP Location: Left Arm,  Patient Position: Sitting)   Pulse 82   Ht 5\' 3"  (1.6 m)   Wt 173 lb 3.2 oz (78.6 kg)   SpO2 95%   BMI 30.68 kg/m     Wt Readings from Last 3 Encounters:  06/18/22 173 lb 3.2 oz (78.6 kg)  11/27/21 170 lb (77.1 kg)  10/09/21 167 lb (75.8 kg)     GEN:  Well nourished, well developed in no acute distress HEENT: Normal NECK: No JVD; No carotid bruits LYMPHATICS: No lymphadenopathy CARDIAC: RRR, no murmurs, no rubs, no gallops RESPIRATORY:  Clear to auscultation without rales, wheezing or rhonchi  ABDOMEN: Soft, non-tender, non-distended MUSCULOSKELETAL:  No edema; No deformity  SKIN: Warm and dry LOWER EXTREMITIES: no swelling NEUROLOGIC:  Alert and oriented x 3 PSYCHIATRIC:  Normal affect   ASSESSMENT:    1. Precordial pain   2. Aortic atherosclerosis (HCC)   3. Atherosclerosis of both carotid arteries   4. Essential hypertension   5. Prediabetes   6. Dyspnea on exertion   7. Atypical chest pain    PLAN:    In order of problems listed above:  Precordial chest pain somewhat atypical but some characteristics are worrisome especially take into consideration the fact that it it can happen with exercise.  I think the best approach to this situation will be to have coronary CT angio scheduled. Aortic atherosclerosis: I do not have her fasting lipid profile.  Will call primary care physician to get a copy of it.  Last numbers I have from 2022 with LDL 148 which is clearly elevated. Essential hypertension, blood pressure seems to well-controlled we will continue present management. Dyspnea on exertion echocardiogram does not explain her symptomatology however echocardiogram is incomplete.  There is now assessment of diastolic function, I do see in the computer proBNP done 8 months ago which was 70 which is normal which indicates no evidence of congestive heart failure.  If coronary CT angio will not find explanation for shortness of breath then we will repeat echocardiogram with  assessment of diastolic function   Medication Adjustments/Labs and Tests Ordered: Current medicines are reviewed at length with the patient today.  Concerns regarding medicines  are outlined above.  Orders Placed This Encounter  Procedures   CT CORONARY MORPH W/CTA COR W/SCORE W/CA W/CM &/OR WO/CM   Basic Metabolic Panel (BMET)   Medication changes:  Meds ordered this encounter  Medications   metoprolol tartrate (LOPRESSOR) 100 MG tablet    Sig: Take 1 tablet (100 mg total) by mouth once for 1 dose. Please take this medication 2 hours before CT    Dispense:  1 tablet    Refill:  0    Signed, Georgeanna Lea, MD, [facility] 06/18/2022 12:57 PM    [facility]

## 2022-06-21 LAB — BASIC METABOLIC PANEL
BUN/Creatinine Ratio: 26 (ref 12–28)
BUN: 21 mg/dL (ref 8–27)
CO2: 23 mmol/L (ref 20–29)
Calcium: 9.3 mg/dL (ref 8.7–10.3)
Chloride: 103 mmol/L (ref 96–106)
Creatinine, Ser: 0.81 mg/dL (ref 0.57–1.00)
Glucose: 93 mg/dL (ref 70–99)
Potassium: 4.1 mmol/L (ref 3.5–5.2)
Sodium: 138 mmol/L (ref 134–144)
eGFR: 81 mL/min/{1.73_m2} (ref >59)

## 2022-06-26 ENCOUNTER — Telehealth (HOSPITAL_COMMUNITY): Payer: Self-pay | Admitting: *Deleted

## 2022-06-26 NOTE — Telephone Encounter (Signed)
Reaching out to patient to offer assistance regarding upcoming cardiac imaging study; pt verbalizes understanding of appt date/time, parking situation and where to check in, pre-test NPO status and medications ordered, and verified current allergies; name and call back number provided for further questions should they arise    RN Navigator Cardiac [facility] [phone] office [phone] cell  Patient to take 100mg metoprolol tartrate two hours prior to her cardiac CT scan. She is aware to arrive at 12pm. 

## 2022-06-27 ENCOUNTER — Ambulatory Visit (HOSPITAL_COMMUNITY)
Admission: RE | Admit: 2022-06-27 | Discharge: 2022-06-27 | Disposition: A | Discharge status: Home / Self Care | Payer: Medicare HMO | Source: Ambulatory Visit | Attending: Cardiology | Admitting: Cardiology

## 2022-06-27 DIAGNOSIS — R072 Precordial pain: Secondary | ICD-10-CM | POA: Diagnosis not present

## 2022-06-27 MED ORDER — NITROGLYCERIN 0.4 MG SL SUBL
SUBLINGUAL_TABLET | SUBLINGUAL | Status: AC
  Filled 2022-06-27: qty 2

## 2022-06-27 MED ORDER — NITROGLYCERIN 0.4 MG SL SUBL
0.8000 mg | SUBLINGUAL_TABLET | Freq: Once | SUBLINGUAL | Status: AC
  Administered 2022-06-27: 0.8 mg via SUBLINGUAL

## 2022-06-27 MED ORDER — IOHEXOL 350 MG/ML SOLN
95.0000 mL | Freq: Once | INTRAVENOUS | Status: AC | PRN
  Administered 2022-06-27: 95 mL via INTRAVENOUS

## 2022-07-04 ENCOUNTER — Telehealth: Payer: Self-pay | Admitting: Cardiology

## 2022-07-04 ENCOUNTER — Telehealth: Payer: Self-pay

## 2022-07-04 NOTE — Telephone Encounter (Signed)
Patient is calling requesting a callback to discuss her CT results. Please advise.  

## 2022-07-04 NOTE — Telephone Encounter (Signed)
CT Angio Results reviewed with pt as per Dr. Krasowski's note.  Pt verbalized understanding and had no additional questions. Routed to PCP 

## 2022-07-17 ENCOUNTER — Telehealth: Payer: Self-pay | Admitting: *Deleted

## 2022-07-17 NOTE — Telephone Encounter (Signed)
   Pre-operative Risk Assessment    Patient Name: Kathy [name]  DOB: 1955/05/19 MRN: [id number]      Request for Surgical Clearance    Procedure:   RIGHT TOTAL KNEE ARTHROPLASTY  Date of Surgery:  Clearance TBD                                 Surgeon:  Margarita Rana, MD Surgeon's Group or Practice Name:  [facility] Phone number:  [phone]: [phone] Fax number:  [phone]   Type of Clearance Requested:   - Medical    Type of Anesthesia:  Spinal   Additional requests/questions:    Wilhemina Cash   07/17/2022, 1:44 PM

## 2022-07-17 NOTE — Telephone Encounter (Signed)
   Primary Cardiologist: Gypsy Balsam, MD   I contacted patient to ensure no further concerning cardiac symptoms. She continues to have shortness of breath and is limited by chronic back and knee pain, but she is able to achieve > 4 METS activity without concern cardiac symptoms. Her RCRI is 0.4%.   Given past medical history, based on ACC/AHA guidelines, Kathy [name] would be at acceptable risk for the planned procedure without further cardiovascular testing.   Patient was advised that if she develops new symptoms prior to surgery to contact our office to arrange a follow-up appointment.  He verbalized understanding.  I will route this recommendation to the requesting party via Epic fax function and remove from pre-op pool.  Please call with questions.  Levi Aland, NP-C  07/17/2022, 3:30 PM [address] Office [phone] Fax [phone]

## 2022-08-26 NOTE — Progress Notes (Addendum)
Anesthesia Review:  PCP: Koren Bound- preop ov on 07/24/22  Cardiologist :Bing Matter  6/12/24Lebron Conners telephone encounter clearance  Chest x-ray : EKG : 10/09/21  CT Cors- 07/03/22  Echo : 05/13/22  Stress test: 2022  Cardiac Cath :  Activity level: can do a flight of stairs without difficutly  Sleep Study/ CPAP : Fasting Blood Sugar :      / Checks Blood Sugar -- times a day:   Blood Thinner/ Instructions /Last Dose: ASA / Instructions/ Last Dose :    Prediabetes - on no meds

## 2022-08-26 NOTE — H&P (Signed)
KNEE ARTHROPLASTY ADMISSION H&P  Patient ID: Kathy [name] MRN: [id number] DOB/AGE: April 21, 1955 67 y.o.  Chief Complaint: right knee pain.  Planned Procedure Date: 09/10/22  Medical Clearance by Koren Bound NP   Cardiac Clearance by Dr. Bing Matter   HPI: Kathy [name] is a 67 y.o. female who presents for evaluation of OA RIGHT KNEE. The patient has a history of pain and functional disability in the right knee due to arthritis and has failed non-surgical conservative treatments for greater than 12 weeks to include NSAID's and/or analgesics, corticosteriod injections, viscosupplementation injections, use of assistive devices, and activity modification.  Onset of symptoms was gradual, starting >10 years ago with gradually worsening course since that time. The patient noted prior procedures on the knee to include  arthroscopy and menisectomy on the right knee.  Patient currently rates pain at 8 out of 10 with activity. Patient has night pain, worsening of pain with activity and weight bearing, and pain that interferes with activities of daily living.  Patient has evidence of subchondral sclerosis, periarticular osteophytes, and joint space narrowing by imaging studies.  There is no active infection.  Past Medical History:  Diagnosis Date   Anxiety    Aortic atherosclerosis (HCC)    Arthritis    Depression    Dyspnea    Fibromyalgia    GERD (gastroesophageal reflux disease)    Hypertension    Hypothyroidism    Pneumonia    Pre-diabetes    Past Surgical History:  Procedure Laterality Date   ABDOMINAL HYSTERECTOMY     CERVICAL DISC SURGERY     x 3   CHOLECYSTECTOMY     COLONOSCOPY     KNEE ARTHROSCOPY     right   POSTERIOR CERVICAL FUSION/FORAMINOTOMY  02/17/2012   Procedure: POSTERIOR CERVICAL FUSION/FORAMINOTOMY LEVEL 2;  Surgeon: Hewitt Shorts, MD;  Location: [facility];  Service: Neurosurgery;  Laterality: N/A;  Cervical five to seven posterior arthrodesis   POSTERIOR  CERVICAL FUSION/FORAMINOTOMY N/A 03/25/2012   Procedure: POSTERIOR CERVICAL FUSION/FORAMINOTOMY LEVEL 1;  Surgeon: Hewitt Shorts, MD;  Location: [facility];  Service: Neurosurgery;  Laterality: N/A;  Cervical five-six posterior cervical arthrodesis and instrumentation    RADIOLOGY WITH ANESTHESIA N/A 08/27/2018   Procedure: MRI LUMBAR SPINE WITHOUT CONTRAST;  Surgeon: Radiologist, Medication, MD;  Location: [facility];  Service: Radiology;  Laterality: N/A;   RADIOLOGY WITH ANESTHESIA Right 10/09/2021   Procedure: MRI WITH ANESTHESIA;  Surgeon: Radiologist, Medication, MD;  Location: [facility];  Service: Radiology;  Laterality: Right;   RADIOLOGY WITH ANESTHESIA N/A 11/27/2021   Procedure: LUMBER SPINE WITHOUT CONTRAST;  Surgeon: Radiologist, Medication, MD;  Location: [facility];  Service: Radiology;  Laterality: N/A;   SHOULDER DEBRIDEMENT     5- Right, 3 left   SINOSCOPY     TRANSFORAMINAL LUMBAR INTERBODY FUSION W/ MIS 1 LEVEL Left 07/12/2019   Procedure: Left Lumbar Five- Sacral One minimally invasive transforaminal lumbar interbody fusion;  Surgeon: Jadene Pierini, MD;  Location: [facility];  Service: Neurosurgery;  Laterality: Left;  Left Lumbar Five- Sacral One minimally invasive transforaminal lumbar interbody fusion   TUBAL LIGATION     Allergies  Allergen Reactions   Gabapentin Swelling    From 06/21/2013 OV: Pt thinks it affects her speech some "more thick tongued".   Amlodipine Swelling   Clonazepam Other (See Comments)    syncope   Losartan Other (See Comments)    unknown   Mobic [Meloxicam] Itching   Naproxen Sodium Itching  Lisinopril Cough   Prior to Admission medications   Medication Sig Start Date End Date Taking? Authorizing Provider  acetaminophen (TYLENOL) 650 MG CR tablet Take 1,300 mg by mouth every 8 (eight) hours as needed for pain.   Yes [provider]  albuterol (VENTOLIN HFA) 108 (90 Base) MCG/ACT inhaler Inhale 1-2 puffs into the lungs every 6 (six) hours as  needed for wheezing or shortness of breath.   Yes [provider]  budesonide-formoterol (SYMBICORT) 160-4.5 MCG/ACT inhaler Inhale 2 puffs into the lungs 2 (two) times daily.   Yes [provider]  celecoxib (CELEBREX) 200 MG capsule Take 200 mg by mouth at bedtime.   Yes [provider]  diclofenac Sodium (VOLTAREN) 1 % GEL Apply 2 g topically daily as needed (pain).   Yes [provider]  estradiol (ESTRACE) 0.5 MG tablet Take 0.5 mg by mouth daily.    Yes [provider]  famotidine (PEPCID) 40 MG tablet Take 40 mg by mouth 2 (two) times daily.   Yes [provider]  FLUoxetine (PROZAC) 20 MG capsule Take 20 mg by mouth daily.   Yes [provider]  FLUoxetine (PROZAC) 40 MG capsule Take 40 mg by mouth every morning.   Yes [provider]  fluticasone (FLONASE) 50 MCG/ACT nasal spray Place 2 sprays into both nostrils 2 (two) times daily.   Yes [provider]  hydrochlorothiazide (HYDRODIURIL) 12.5 MG tablet Take 12.5 mg by mouth daily.   Yes [provider]  levothyroxine (SYNTHROID) 50 MCG tablet Take 50 mcg by mouth daily before breakfast.   Yes [provider]  montelukast (SINGULAIR) 10 MG tablet Take 10 mg by mouth daily. 09/28/21  Yes [provider]  pramipexole (MIRAPEX) 1.5 MG tablet Take 3 mg by mouth in the morning and at bedtime. 08/13/22  Yes [provider]  tiZANidine (ZANAFLEX) 4 MG tablet Take 4 mg by mouth at bedtime. 07/26/22  Yes [provider]  cyclobenzaprine (FLEXERIL) 10 MG tablet TAKE ONE TABLET BY MOUTH 3 TIMES DAILY AS NEEDED FOR MUSCLE SPASMS Patient taking differently: Take 10 mg by mouth 3 (three) times daily as needed for muscle spasms. 02/08/21   Asencion Islam, DPM  metoprolol tartrate (LOPRESSOR) 100 MG tablet Take 1 tablet (100 mg total) by mouth once for 1 dose. Please take this medication 2 hours before CT Patient not taking: Reported on  08/23/2022 06/18/22 06/18/22  Georgeanna Lea, MD   Social History   Socioeconomic History   Marital status: Divorced    Spouse name: Not on file   Number of children: Not on file   Years of education: Not on file   Highest education level: Not on file  Occupational History   Not on file  Tobacco Use   Smoking status: Never   Smokeless tobacco: Never  Vaping Use   Vaping status: Never Used  Substance and Sexual Activity   Alcohol use: Yes    Alcohol/week: 0.0 standard drinks of alcohol    Comment: rarely - , 1 per month   Drug use: No   Sexual activity: Never  Other Topics Concern   Not on file  Social History Narrative   Lives alone.  No children.     Right Handed    Lives in a two story home    Social Determinants of Health   Financial Resource Strain: Not on file  Food Insecurity: Not on file  Transportation Needs: No Transportation Needs (08/15/2021)  Received from [facility] visits prior to 04/06/2022., [facility]   PRAPARE - Transportation    Lack of Transportation (Medical): No    Lack of Transportation (Non-Medical): No  Physical Activity: Not on file  Stress: Not on file  Social Connections: Not on file   Family History  Problem Relation Age of Onset   Heart disease Mother 61   Diabetes Mother    Heart disease Father 23   Diabetes Sister    High blood pressure Brother    High blood pressure Brother    Heart disease Brother 26   Stroke Brother    Diabetes Brother    Heart disease Maternal Grandfather    Heart disease Paternal Grandmother    Heart disease Paternal Grandfather    Colon cancer Maternal Aunt    Heart disease Paternal Uncle     ROS: Currently denies lightheadedness, dizziness, Fever, chills, CP, SOB.   No personal history of DVT, PE, MI, or CVA. No loose teeth or dentures All other systems have been reviewed and were otherwise currently negative with the exception of those mentioned in the HPI  and as above.  Objective: Vitals: Ht: 5'3" Wt: 168 lbs Temp: 98 BP: 167/78 Pulse: 69 O2 97% on room air.   Physical Exam: General: Alert, NAD.  Antalgic Gait  HEENT: EOMI, Good Neck Extension  Pulm: No increased work of breathing.  Clear B/L A/P w/o crackle or wheeze.  CV: RRR, No m/g/r appreciated  GI: soft, NT, ND. BS x 4 quadrants Neuro: CN II-XII grossly intact without focal deficit.  Sensation intact distally Skin: No lesions in the area of chief complaint MSK/Surgical Site:  + JLT. ROM slow from 5-100 degrees.  Decreased strength in extension and flexion. Focal edema. +EHL/FHL.  NVI.  Pain and instability with varus and valgus stress.    Imaging Review Plain radiographs demonstrate severe degenerative joint disease of the right knee.   The overall alignment ismild valgus. The bone quality appears to be fair for age and reported activity level.  Preoperative templating of the joint replacement has been completed, documented, and submitted to the Operating Room personnel in order to optimize intra-operative equipment management.  Assessment: OA RIGHT KNEE Active Problems:   * No active hospital problems. *   Plan: Plan for Procedure(s): TOTAL KNEE ARTHROPLASTY  The patient history, physical exam, clinical judgement of the provider and imaging are consistent with end stage degenerative joint disease and total joint arthroplasty is deemed medically necessary. The treatment options including medical management, injection therapy, and arthroplasty were discussed at length. The risks and benefits of Procedure(s): TOTAL KNEE ARTHROPLASTY were presented and reviewed.  The risks of nonoperative treatment, versus surgical intervention including but not limited to continued pain, aseptic loosening, stiffness, dislocation/subluxation, infection, bleeding, nerve injury, blood clots, cardiopulmonary complications, morbidity, mortality, among others were discussed. The patient verbalizes  understanding and wishes to proceed with the plan.  Patient is being admitted for inpatient treatment for surgery, pain control, PT, prophylactic antibiotics, VTE prophylaxis, progressive ambulation, ADL's and discharge planning.   Dental prophylaxis discussed and recommended for 2 years postoperatively.  The patient does meet the criteria for TXA which will be used perioperatively.   ASA 81 mg BID will be used postoperatively for DVT prophylaxis in addition to SCDs, and early ambulation. Plan for Tylenol, Celebrex, oxycodone for pain.   Robaxin for muscle spasms.   Zofran for nausea and vomiting. Senokot for constipation prevention Pharmacy- [facility]  [address] in [place] The patient is planning to be discharged home with OPPT and into the care of her friend Janyce Llanos who can be reached at [phone] Follow up appt 09/25/22 at 4:15pm    [place] [facility] Office [phone] 08/26/2022 4:34 PM

## 2022-08-26 NOTE — Patient Instructions (Signed)
If you received a COVID test during your pre-op visit  it is requested that you wear a mask when out in public, stay away from anyone that may not be feeling well and notify your surgeon if you develop symptoms. If you test positive for Covid or have been in contact with anyone that has tested positive in the last 10 days please notify you surgeon.    SURGICAL WAITING ROOM VISITATION  Patients having surgery or a procedure may have no more than 2 support people in the waiting area - these visitors may rotate.    Children under the age of 24 must have an adult with them who is not the patient.  Due to an increase in RSV and influenza rates and associated hospitalizations, children ages 35 and under may not visit patients in [facility] hospitals.  If the patient needs to stay at the hospital during part of their recovery, the visitor guidelines for inpatient rooms apply. Pre-op nurse will coordinate an appropriate time for 1 support person to accompany patient in pre-op.  This support person may not rotate.    Please refer to the [facility] website for the visitor guidelines for Inpatients (after your surgery is over and you are in a regular room).       Your procedure is scheduled on:  09/10/2022    Report to [facility] Main Entrance    Report to admitting at  0515 AM   Call this number if you have problems the morning of surgery [phone]   Do not eat food :After Midnight.   After Midnight you may have the following liquids until __ 0430___ AM  DAY OF SURGERY  Water Non-Citrus Juices (without pulp, NO RED-Apple, White grape, White cranberry) Black Coffee (NO MILK/CREAM OR CREAMERS, sugar ok)  Clear Tea (NO MILK/CREAM OR CREAMERS, sugar ok) regular and decaf                             Plain Jell-O (NO RED)                                           Fruit ices (not with fruit pulp, NO RED)                                     Popsicles (NO RED)                                                                Sports drinks like Gatorade (NO RED)                    The day of surgery:  Drink ONE (1) Pre-Surgery Clear Ensure or G2 at 0430 AM ( have completed by )  the morning of surgery. Drink in one sitting. Do not sip.  This drink was given to you during your hospital  pre-op appointment visit. Nothing else to drink after completing the  Pre-Surgery Clear Ensure or G2.          If  you have questions, please contact your surgeon's office.       Oral Hygiene is also important to reduce your risk of infection.                                    Remember - BRUSH YOUR TEETH THE MORNING OF SURGERY WITH YOUR REGULAR TOOTHPASTE  DENTURES WILL BE REMOVED PRIOR TO SURGERY PLEASE DO NOT APPLY "Poly grip" OR ADHESIVES!!!   Do NOT smoke after Midnight   Take these medicines the morning of surgery with A SIP OF WATER:  inhalers as usual and bring, pepcid, prozac, flonase, singulair, mirapex , synthroid   DO NOT TAKE ANY ORAL DIABETIC MEDICATIONS DAY OF YOUR SURGERY  Bring CPAP mask and tubing day of surgery.                              You may not have any metal on your body including hair pins, jewelry, and body piercing             Do not wear make-up, lotions, powders, perfumes/cologne, or deodorant  Do not wear nail polish including gel and S&S, artificial/acrylic nails, or any other type of covering on natural nails including finger and toenails. If you have artificial nails, gel coating, etc. that needs to be removed by a nail salon please have this removed prior to surgery or surgery may need to be canceled/ delayed if the surgeon/ anesthesia feels like they are unable to be safely monitored.   Do not shave  48 hours prior to surgery.               Men may shave face and neck.   Do not bring valuables to the hospital. [facility] IS NOT             RESPONSIBLE   FOR VALUABLES.   Contacts, glasses, dentures or bridgework may not be worn into  surgery.   Bring small overnight bag day of surgery.   DO NOT BRING YOUR HOME MEDICATIONS TO THE HOSPITAL. PHARMACY WILL DISPENSE MEDICATIONS LISTED ON YOUR MEDICATION LIST TO YOU DURING YOUR ADMISSION IN THE HOSPITAL!    Patients discharged on the day of surgery will not be allowed to drive home.  Someone NEEDS to stay with you for the first 24 hours after anesthesia.   Special Instructions: Bring a copy of your healthcare power of attorney and living will documents the day of surgery if you haven't scanned them before.              Please read over the following fact sheets you were given: IF YOU HAVE QUESTIONS ABOUT YOUR PRE-OP INSTRUCTIONS PLEASE CALL [phone]   If you received a COVID test during your pre-op visit  it is requested that you wear a mask when out in public, stay away from anyone that may not be feeling well and notify your surgeon if you develop symptoms. If you test positive for Covid or have been in contact with anyone that has tested positive in the last 10 days please notify you surgeon.      Pre-operative 5 CHG Bath Instructions   You can play a key role in reducing the risk of infection after surgery. Your skin needs to be as free of germs as possible. You can reduce the number of germs on your  skin by washing with CHG (chlorhexidine gluconate) soap before surgery. CHG is an antiseptic soap that kills germs and continues to kill germs even after washing.   DO NOT use if you have an allergy to chlorhexidine/CHG or antibacterial soaps. If your skin becomes reddened or irritated, stop using the CHG and notify one of our RNs at [phone].   Please shower with the CHG soap starting 4 days before surgery using the following schedule:     Please keep in mind the following:  DO NOT shave, including legs and underarms, starting the day of your first shower.   You may shave your face at any point before/day of surgery.  Place clean sheets on your bed the day you  start using CHG soap. Use a clean washcloth (not used since being washed) for each shower. DO NOT sleep with pets once you start using the CHG.   CHG Shower Instructions:  If you choose to wash your hair and private area, wash first with your normal shampoo/soap.  After you use shampoo/soap, rinse your hair and body thoroughly to remove shampoo/soap residue.  Turn the water OFF and apply about 3 tablespoons (45 ml) of CHG soap to a CLEAN washcloth.  Apply CHG soap ONLY FROM YOUR NECK DOWN TO YOUR TOES (washing for 3-5 minutes)  DO NOT use CHG soap on face, private areas, open wounds, or sores.  Pay special attention to the area where your surgery is being performed.  If you are having back surgery, having someone wash your back for you may be helpful. Wait 2 minutes after CHG soap is applied, then you may rinse off the CHG soap.  Pat dry with a clean towel  Put on clean clothes/pajamas   If you choose to wear lotion, please use ONLY the CHG-compatible lotions on the back of this paper.     Additional instructions for the day of surgery: DO NOT APPLY any lotions, deodorants, cologne, or perfumes.   Put on clean/comfortable clothes.  Brush your teeth.  Ask your nurse before applying any prescription medications to the skin.      CHG Compatible Lotions   Aveeno Moisturizing lotion  Cetaphil Moisturizing Cream  Cetaphil Moisturizing Lotion  Clairol Herbal Essence Moisturizing Lotion, Dry Skin  Clairol Herbal Essence Moisturizing Lotion, Extra Dry Skin  Clairol Herbal Essence Moisturizing Lotion, Normal Skin  Curel Age Defying Therapeutic Moisturizing Lotion with Alpha Hydroxy  Curel Extreme Care Body Lotion  Curel Soothing Hands Moisturizing Hand Lotion  Curel Therapeutic Moisturizing Cream, Fragrance-Free  Curel Therapeutic Moisturizing Lotion, Fragrance-Free  Curel Therapeutic Moisturizing Lotion, Original Formula  Eucerin Daily Replenishing Lotion  Eucerin Dry Skin Therapy  Plus Alpha Hydroxy Crme  Eucerin Dry Skin Therapy Plus Alpha Hydroxy Lotion  Eucerin Original Crme  Eucerin Original Lotion  Eucerin Plus Crme Eucerin Plus Lotion  Eucerin TriLipid Replenishing Lotion  Keri Anti-Bacterial Hand Lotion  Keri Deep Conditioning Original Lotion Dry Skin Formula Softly Scented  Keri Deep Conditioning Original Lotion, Fragrance Free Sensitive Skin Formula  Keri Lotion Fast Absorbing Fragrance Free Sensitive Skin Formula  Keri Lotion Fast Absorbing Softly Scented Dry Skin Formula  Keri Original Lotion  Keri Skin Renewal Lotion Keri Silky Smooth Lotion  Keri Silky Smooth Sensitive Skin Lotion  Nivea Body Creamy Conditioning Oil  Nivea Body Extra Enriched Teacher, adult education Moisturizing Lotion Nivea Crme  Nivea Skin Firming Lotion  NutraDerm 30 Skin Lotion  NutraDerm Skin Lotion  NutraDerm Therapeutic  Skin Cream  NutraDerm Therapeutic Skin Lotion  ProShield Protective Hand Cream  Provon moisturizing lotion

## 2022-08-28 ENCOUNTER — Encounter (HOSPITAL_COMMUNITY): Payer: Self-pay

## 2022-08-28 ENCOUNTER — Other Ambulatory Visit: Payer: Self-pay

## 2022-08-28 ENCOUNTER — Encounter (HOSPITAL_COMMUNITY)
Admission: RE | Admit: 2022-08-28 | Discharge: 2022-08-28 | Disposition: A | Discharge status: Home / Self Care | Payer: Medicare HMO | Source: Ambulatory Visit | Attending: Orthopedic Surgery | Admitting: Orthopedic Surgery

## 2022-08-28 VITALS — BP 179/75 | HR 61 | Temp 97.7°F | Resp 16 | Ht 63.0 in | Wt 163.0 lb

## 2022-08-28 DIAGNOSIS — I10 Essential (primary) hypertension: Secondary | ICD-10-CM | POA: Insufficient documentation

## 2022-08-28 DIAGNOSIS — G4730 Sleep apnea, unspecified: Secondary | ICD-10-CM | POA: Diagnosis not present

## 2022-08-28 DIAGNOSIS — G8929 Other chronic pain: Secondary | ICD-10-CM | POA: Diagnosis not present

## 2022-08-28 DIAGNOSIS — M1711 Unilateral primary osteoarthritis, right knee: Secondary | ICD-10-CM | POA: Diagnosis not present

## 2022-08-28 DIAGNOSIS — M549 Dorsalgia, unspecified: Secondary | ICD-10-CM | POA: Insufficient documentation

## 2022-08-28 DIAGNOSIS — Z01818 Encounter for other preprocedural examination: Secondary | ICD-10-CM

## 2022-08-28 DIAGNOSIS — Z01812 Encounter for preprocedural laboratory examination: Secondary | ICD-10-CM | POA: Diagnosis present

## 2022-08-28 DIAGNOSIS — R0602 Shortness of breath: Secondary | ICD-10-CM | POA: Diagnosis not present

## 2022-08-28 HISTORY — DX: Sleep apnea, unspecified: G47.30

## 2022-08-28 LAB — BASIC METABOLIC PANEL
Anion gap: 1 — ABNORMAL LOW (ref 5–15)
BUN: 20 mg/dL (ref 8–23)
CO2: 23 mmol/L (ref 22–32)
Calcium: 9.2 mg/dL (ref 8.9–10.3)
Chloride: 104 mmol/L (ref 98–111)
Creatinine, Ser: 0.8 mg/dL (ref 0.44–1.00)
GFR, Estimated: >60 mL/min (ref >60)
Glucose, Bld: 95 mg/dL (ref 70–99)
Potassium: 4.2 mmol/L (ref 3.5–5.1)
Sodium: 137 mmol/L (ref 135–145)

## 2022-08-28 LAB — CBC
HCT: 39.3 % (ref 36.0–46.0)
Hemoglobin: 12.6 g/dL (ref 12.0–15.0)
MCH: 27.6 pg (ref 26.0–34.0)
MCHC: 32.1 g/dL (ref 30.0–36.0)
MCV: 86 fL (ref 80.0–100.0)
Platelets: 271 10*3/uL (ref 150–400)
RBC: 4.57 MIL/uL (ref 3.87–5.11)
RDW: 12.9 % (ref 11.5–15.5)
WBC: 6.6 10*3/uL (ref 4.0–10.5)
nRBC: 0 % (ref 0.0–0.2)

## 2022-08-28 LAB — SURGICAL PCR SCREEN
MRSA, PCR: NEGATIVE
Staphylococcus aureus: POSITIVE — AB

## 2022-08-30 NOTE — Progress Notes (Signed)
Anesthesia Chart Review   Case: [id number] Date/Time: 09/10/22 0715   Procedure: TOTAL KNEE ARTHROPLASTY (Right: Knee)   Anesthesia type: Spinal   Pre-op diagnosis: OA RIGHT KNEE   Location: [facility] / [facility]   Surgeons: Sheral Apley, MD       DISCUSSION:67 y.o. never smoker with h/o HTN, sleep apnea, right knee OA scheduled for above procedure 09/10/2022 with Dr. Margarita Rana.   Per cardiology preoperative evaluation 07/17/2022. Per OV note, "I contacted patient to ensure no further concerning cardiac symptoms. She continues to have shortness of breath and is limited by chronic back and knee pain, but she is able to achieve > 4 METS activity without concern cardiac symptoms. Her RCRI is 0.4%.  Given past medical history, based on ACC/AHA guidelines, Satsuki Berganza Dilone would be at acceptable risk for the planned procedure without further cardiovascular testing."  VS: BP (!) 179/75   Pulse 61   Temp 36.5 C (Oral)   Resp 16   Ht 5\' 3"  (1.6 m)   Wt 73.9 kg   SpO2 98%   BMI 28.87 kg/m   PROVIDERS: Audie Pinto, FNP is PCP   Primary Cardiologist: Gypsy Balsam, MD   LABS: Labs reviewed: Acceptable for surgery. (all labs ordered are listed, but only abnormal results are displayed)  Labs Reviewed  SURGICAL PCR SCREEN - Abnormal; Notable for the following components:      Result Value   Staphylococcus aureus POSITIVE (*)    All other components within normal limits  BASIC METABOLIC PANEL - Abnormal; Notable for the following components:   Anion gap 1 (*)    All other components within normal limits  CBC     IMAGES:   EKG:   CV: CT Coronary 06/27/2022 IMPRESSION: 1. Coronary calcium score of 0. This was percentile for age-, sex, and race-matched controls.   2. Total plaque volume 54 mm3 which is 42nd percentile for age- and sex-matched controls (calcified plaque 3 mm3; non-calcified plaque 51 mm3). TPV is (mild).   2. Normal coronary origin with right  dominance.   3. There is minimal (<25%) soft plaque in the LAD.  CAD RADS 1. Past Medical History:  Diagnosis Date   Anxiety    Aortic atherosclerosis (HCC)    Arthritis    Asthma    Depression    Dyspnea    with exertion   Fibromyalgia    GERD (gastroesophageal reflux disease)    Hypertension    Hypothyroidism    Pneumonia    Pre-diabetes    Sleep apnea    not in current use due to mask does not fit    Past Surgical History:  Procedure Laterality Date   ABDOMINAL HYSTERECTOMY     ANTERIOR CERVICAL DECOMP/DISCECTOMY FUSION     x 2   CERVICAL DISC SURGERY     x 3   CHOLECYSTECTOMY     COLONOSCOPY     KNEE ARTHROSCOPY     right   KNEE SURGERY     right knee surgery x 3   NASAL SINUS SURGERY     POSTERIOR CERVICAL FUSION/FORAMINOTOMY  02/17/2012   Procedure: POSTERIOR CERVICAL FUSION/FORAMINOTOMY LEVEL 2;  Surgeon: Hewitt Shorts, MD;  Location: [facility];  Service: Neurosurgery;  Laterality: N/A;  Cervical five to seven posterior arthrodesis   POSTERIOR CERVICAL FUSION/FORAMINOTOMY N/A 03/25/2012   Procedure: POSTERIOR CERVICAL FUSION/FORAMINOTOMY LEVEL 1;  Surgeon: Hewitt Shorts, MD;  Location: [facility];  Service:  Neurosurgery;  Laterality: N/A;  Cervical five-six posterior cervical arthrodesis and instrumentation    RADIOLOGY WITH ANESTHESIA N/A 08/27/2018   Procedure: MRI LUMBAR SPINE WITHOUT CONTRAST;  Surgeon: Radiologist, Medication, MD;  Location: [facility];  Service: Radiology;  Laterality: N/A;   RADIOLOGY WITH ANESTHESIA Right 10/09/2021   Procedure: MRI WITH ANESTHESIA;  Surgeon: Radiologist, Medication, MD;  Location: [facility];  Service: Radiology;  Laterality: Right;   RADIOLOGY WITH ANESTHESIA N/A 11/27/2021   Procedure: LUMBER SPINE WITHOUT CONTRAST;  Surgeon: Radiologist, Medication, MD;  Location: [facility];  Service: Radiology;  Laterality: N/A;   right wrist surgery      SHOULDER DEBRIDEMENT     5- Right, 3 left   SHOULDER SURGERY     5 on right  and 3 on left   SINOSCOPY     TRANSFORAMINAL LUMBAR INTERBODY FUSION W/ MIS 1 LEVEL Left 07/12/2019   Procedure: Left Lumbar Five- Sacral One minimally invasive transforaminal lumbar interbody fusion;  Surgeon: Jadene Pierini, MD;  Location: [facility];  Service: Neurosurgery;  Laterality: Left;  Left Lumbar Five- Sacral One minimally invasive transforaminal lumbar interbody fusion   TUBAL LIGATION      MEDICATIONS:  pramipexole (MIRAPEX) 1.5 MG tablet   acetaminophen (TYLENOL) 650 MG CR tablet   albuterol (VENTOLIN HFA) 108 (90 Base) MCG/ACT inhaler   budesonide-formoterol (SYMBICORT) 160-4.5 MCG/ACT inhaler   celecoxib (CELEBREX) 200 MG capsule   cyclobenzaprine (FLEXERIL) 10 MG tablet   diclofenac Sodium (VOLTAREN) 1 % GEL   estradiol (ESTRACE) 0.5 MG tablet   famotidine (PEPCID) 40 MG tablet   FLUoxetine (PROZAC) 20 MG capsule   FLUoxetine (PROZAC) 40 MG capsule   fluticasone (FLONASE) 50 MCG/ACT nasal spray   hydrochlorothiazide (HYDRODIURIL) 12.5 MG tablet   levothyroxine (SYNTHROID) 50 MCG tablet   metoprolol tartrate (LOPRESSOR) 100 MG tablet   montelukast (SINGULAIR) 10 MG tablet   tiZANidine (ZANAFLEX) 4 MG tablet   No current facility-administered medications for this encounter.     Jodell Cipro Ward, PA-C [facility] [phone]

## 2022-08-30 NOTE — Anesthesia Preprocedure Evaluation (Addendum)
Anesthesia Evaluation  Patient identified by MRN, date of birth, ID band Patient awake    Reviewed: Allergy & Precautions, NPO status , Patient's Chart, lab work & pertinent test results  Airway Mallampati: II  TM Distance: >3 FB Neck ROM: Full    Dental no notable dental hx. (+) Teeth Intact, Dental Advisory Given   Pulmonary shortness of breath and with exertion, asthma , sleep apnea (does not wear CPAP)    Pulmonary exam normal breath sounds clear to auscultation       Cardiovascular hypertension, Pt. on medications Normal cardiovascular exam Rhythm:Regular Rate:Normal  CT Coronary 06/27/2022 IMPRESSION: 1. Coronary calcium score of 0. This was percentile for age-, sex, and race-matched controls.   2. Total plaque volume 54 mm3 which is 42nd percentile for age- and sex-matched controls (calcified plaque 3 mm3; non-calcified plaque 51 mm3). TPV is (mild).   2. Normal coronary origin with right dominance.   3. There is minimal (<25%) soft plaque in the LAD.  CAD RADS 1.    Neuro/Psych  Headaches PSYCHIATRIC DISORDERS Anxiety Depression       GI/Hepatic Neg liver ROS,GERD  ,,  Endo/Other  Hypothyroidism    Renal/GU negative Renal ROS  negative genitourinary   Musculoskeletal  (+) Arthritis ,  Fibromyalgia -  Abdominal   Peds  Hematology negative hematology ROS (+)   Anesthesia Other Findings   Reproductive/Obstetrics                             Anesthesia Physical Anesthesia Plan  ASA: 3  Anesthesia Plan: Spinal and Regional   Post-op Pain Management: Regional block* and Tylenol PO (pre-op)*   Induction:   PONV Risk Score and Plan: 2 and Treatment may vary due to age or medical condition, Midazolam, Propofol infusion, Dexamethasone and Ondansetron  Airway Management Planned: Natural Airway  Additional Equipment:   Intra-op Plan:   Post-operative Plan:   Informed  Consent: I have reviewed the patients History and Physical, chart, labs and discussed the procedure including the risks, benefits and alternatives for the proposed anesthesia with the patient or authorized representative who has indicated his/her understanding and acceptance.     Dental advisory given  Plan Discussed with: CRNA  Anesthesia Plan Comments: (See PAT note 08/28/2022)       Anesthesia Quick Evaluation

## 2022-09-10 ENCOUNTER — Ambulatory Visit (HOSPITAL_BASED_OUTPATIENT_CLINIC_OR_DEPARTMENT_OTHER): Payer: Medicare HMO | Admitting: Anesthesiology

## 2022-09-10 ENCOUNTER — Ambulatory Visit (HOSPITAL_COMMUNITY): Payer: Medicare HMO

## 2022-09-10 ENCOUNTER — Ambulatory Visit (HOSPITAL_COMMUNITY)
Admission: RE | Admit: 2022-09-10 | Discharge: 2022-09-10 | Disposition: A | Discharge status: Home / Self Care | Payer: Medicare HMO | Source: Ambulatory Visit | Attending: Orthopedic Surgery | Admitting: Orthopedic Surgery

## 2022-09-10 ENCOUNTER — Encounter (HOSPITAL_COMMUNITY): Payer: Self-pay | Admitting: Orthopedic Surgery

## 2022-09-10 ENCOUNTER — Other Ambulatory Visit: Payer: Self-pay

## 2022-09-10 ENCOUNTER — Ambulatory Visit (HOSPITAL_COMMUNITY): Payer: Medicare HMO | Admitting: Physician Assistant

## 2022-09-10 ENCOUNTER — Encounter (HOSPITAL_COMMUNITY)
Admission: RE | Disposition: A | Discharge status: Home / Self Care | Payer: Self-pay | Source: Ambulatory Visit | Attending: Orthopedic Surgery

## 2022-09-10 DIAGNOSIS — G4730 Sleep apnea, unspecified: Secondary | ICD-10-CM | POA: Insufficient documentation

## 2022-09-10 DIAGNOSIS — J45909 Unspecified asthma, uncomplicated: Secondary | ICD-10-CM | POA: Diagnosis not present

## 2022-09-10 DIAGNOSIS — M1990 Unspecified osteoarthritis, unspecified site: Secondary | ICD-10-CM | POA: Insufficient documentation

## 2022-09-10 DIAGNOSIS — Z96651 Presence of right artificial knee joint: Secondary | ICD-10-CM

## 2022-09-10 DIAGNOSIS — F418 Other specified anxiety disorders: Secondary | ICD-10-CM | POA: Diagnosis not present

## 2022-09-10 DIAGNOSIS — J4531 Mild persistent asthma with (acute) exacerbation: Secondary | ICD-10-CM

## 2022-09-10 DIAGNOSIS — M1711 Unilateral primary osteoarthritis, right knee: Secondary | ICD-10-CM | POA: Diagnosis present

## 2022-09-10 DIAGNOSIS — Z79899 Other long term (current) drug therapy: Secondary | ICD-10-CM | POA: Insufficient documentation

## 2022-09-10 DIAGNOSIS — Z8249 Family history of ischemic heart disease and other diseases of the circulatory system: Secondary | ICD-10-CM | POA: Insufficient documentation

## 2022-09-10 DIAGNOSIS — E039 Hypothyroidism, unspecified: Secondary | ICD-10-CM

## 2022-09-10 DIAGNOSIS — K219 Gastro-esophageal reflux disease without esophagitis: Secondary | ICD-10-CM | POA: Insufficient documentation

## 2022-09-10 DIAGNOSIS — M25761 Osteophyte, right knee: Secondary | ICD-10-CM | POA: Diagnosis not present

## 2022-09-10 DIAGNOSIS — I10 Essential (primary) hypertension: Secondary | ICD-10-CM | POA: Insufficient documentation

## 2022-09-10 DIAGNOSIS — M797 Fibromyalgia: Secondary | ICD-10-CM | POA: Insufficient documentation

## 2022-09-10 HISTORY — PX: TOTAL KNEE ARTHROPLASTY: SHX125

## 2022-09-10 SURGERY — ARTHROPLASTY, KNEE, TOTAL
Anesthesia: Regional | Site: Knee | Laterality: Right

## 2022-09-10 MED ORDER — TRANEXAMIC ACID-NACL 1000-0.7 MG/100ML-% IV SOLN
1000.0000 mg | INTRAVENOUS | Status: AC
  Administered 2022-09-10: 1000 mg via INTRAVENOUS
  Filled 2022-09-10: qty 100

## 2022-09-10 MED ORDER — LACTATED RINGERS IV BOLUS: 250.0000 mL | Freq: Once | INTRAVENOUS | Status: AC

## 2022-09-10 MED ORDER — MIDAZOLAM HCL 2 MG/2ML IJ SOLN
INTRAMUSCULAR | Status: AC
  Filled 2022-09-10: qty 2

## 2022-09-10 MED ORDER — SODIUM CHLORIDE 0.9 % IR SOLN
Status: DC | PRN
  Administered 2022-09-10: 1000 mL

## 2022-09-10 MED ORDER — ONDANSETRON HCL 4 MG/2ML IJ SOLN: 4.0000 mg | Freq: Four times a day (QID) | INTRAMUSCULAR | Status: DC | PRN

## 2022-09-10 MED ORDER — 0.9 % SODIUM CHLORIDE (POUR BTL) OPTIME
TOPICAL | Status: DC | PRN
  Administered 2022-09-10: 1000 mL

## 2022-09-10 MED ORDER — CEFAZOLIN SODIUM-DEXTROSE 1-4 GM/50ML-% IV SOLN
INTRAVENOUS | Status: AC
  Administered 2022-09-10: 1 g via INTRAVENOUS
  Filled 2022-09-10: qty 50

## 2022-09-10 MED ORDER — PROPOFOL 1000 MG/100ML IV EMUL
INTRAVENOUS | Status: AC
  Filled 2022-09-10: qty 100

## 2022-09-10 MED ORDER — MAGNESIUM CITRATE PO SOLN: 1.0000 | Freq: Once | ORAL | Status: DC | PRN

## 2022-09-10 MED ORDER — ASPIRIN 81 MG PO CHEW: 81.0000 mg | CHEWABLE_TABLET | Freq: Two times a day (BID) | ORAL | Status: DC

## 2022-09-10 MED ORDER — PHENOL 1.4 % MT LIQD: 1.0000 | OROMUCOSAL | Status: DC | PRN

## 2022-09-10 MED ORDER — KETAMINE HCL 10 MG/ML IJ SOLN
INTRAMUSCULAR | Status: DC | PRN
Start: 2022-09-10 — End: 2022-09-10
  Administered 2022-09-10 (×2): 20 mg via INTRAVENOUS

## 2022-09-10 MED ORDER — DOCUSATE SODIUM 100 MG PO CAPS: 100.0000 mg | ORAL_CAPSULE | Freq: Two times a day (BID) | ORAL | Status: DC

## 2022-09-10 MED ORDER — BISACODYL 10 MG RE SUPP: 10.0000 mg | Freq: Every day | RECTAL | Status: DC | PRN

## 2022-09-10 MED ORDER — ONDANSETRON 4 MG PO TBDP: 4.0000 mg | ORAL_TABLET | Freq: Three times a day (TID) | ORAL | 0 refills | Status: DC | PRN

## 2022-09-10 MED ORDER — METHOCARBAMOL 500 MG IVPB - SIMPLE MED
INTRAVENOUS | Status: AC
  Administered 2022-09-10: 500 mg via INTRAVENOUS
  Filled 2022-09-10: qty 55

## 2022-09-10 MED ORDER — ONDANSETRON HCL 4 MG/2ML IJ SOLN
INTRAMUSCULAR | Status: AC
  Filled 2022-09-10: qty 2

## 2022-09-10 MED ORDER — TRANEXAMIC ACID-NACL 1000-0.7 MG/100ML-% IV SOLN: 1000.0000 mg | Freq: Once | INTRAVENOUS | Status: AC

## 2022-09-10 MED ORDER — DIPHENHYDRAMINE HCL 12.5 MG/5ML PO ELIX: 12.5000 mg | ORAL_SOLUTION | ORAL | Status: DC | PRN

## 2022-09-10 MED ORDER — BUPIVACAINE-EPINEPHRINE 0.25% -1:200000 IJ SOLN
INTRAMUSCULAR | Status: DC | PRN
  Administered 2022-09-10: 30 mL

## 2022-09-10 MED ORDER — METOCLOPRAMIDE HCL 5 MG PO TABS: 5.0000 mg | ORAL_TABLET | Freq: Three times a day (TID) | ORAL | Status: DC | PRN

## 2022-09-10 MED ORDER — FENTANYL CITRATE PF 50 MCG/ML IJ SOSY
PREFILLED_SYRINGE | INTRAMUSCULAR | Status: AC
  Administered 2022-09-10: 50 ug via INTRAVENOUS
  Filled 2022-09-10: qty 2

## 2022-09-10 MED ORDER — HYDROMORPHONE HCL 1 MG/ML IJ SOLN
INTRAMUSCULAR | Status: AC
  Filled 2022-09-10: qty 1

## 2022-09-10 MED ORDER — FENTANYL CITRATE (PF) 100 MCG/2ML IJ SOLN
INTRAMUSCULAR | Status: DC | PRN
  Administered 2022-09-10 (×2): 25 ug via INTRAVENOUS
  Administered 2022-09-10 (×2): 50 ug via INTRAVENOUS
  Administered 2022-09-10 (×2): 25 ug via INTRAVENOUS

## 2022-09-10 MED ORDER — ONDANSETRON HCL 4 MG PO TABS: 4.0000 mg | ORAL_TABLET | Freq: Four times a day (QID) | ORAL | Status: DC | PRN

## 2022-09-10 MED ORDER — ASPIRIN 81 MG PO TBEC: 81.0000 mg | DELAYED_RELEASE_TABLET | Freq: Two times a day (BID) | ORAL | 0 refills | Status: DC

## 2022-09-10 MED ORDER — FENTANYL CITRATE PF 50 MCG/ML IJ SOSY
PREFILLED_SYRINGE | INTRAMUSCULAR | Status: AC
  Filled 2022-09-10: qty 1

## 2022-09-10 MED ORDER — CEFAZOLIN SODIUM-DEXTROSE 1-4 GM/50ML-% IV SOLN: 1.0000 g | Freq: Four times a day (QID) | INTRAVENOUS | Status: DC

## 2022-09-10 MED ORDER — MIDAZOLAM HCL 5 MG/5ML IJ SOLN
INTRAMUSCULAR | Status: DC | PRN
  Administered 2022-09-10 (×2): 1 mg via INTRAVENOUS

## 2022-09-10 MED ORDER — DEXAMETHASONE SODIUM PHOSPHATE 10 MG/ML IJ SOLN
INTRAMUSCULAR | Status: DC | PRN
  Administered 2022-09-10: 10 mg

## 2022-09-10 MED ORDER — HYDROMORPHONE HCL 1 MG/ML IJ SOLN: 0.5000 mg | INTRAMUSCULAR | Status: DC | PRN

## 2022-09-10 MED ORDER — LACTATED RINGERS IV SOLN: INTRAVENOUS | Status: DC

## 2022-09-10 MED ORDER — CEFAZOLIN SODIUM-DEXTROSE 2-4 GM/100ML-% IV SOLN
2.0000 g | INTRAVENOUS | Status: AC
  Administered 2022-09-10: 2 g via INTRAVENOUS
  Filled 2022-09-10: qty 100

## 2022-09-10 MED ORDER — ONDANSETRON HCL 4 MG/2ML IJ SOLN
INTRAMUSCULAR | Status: DC | PRN
  Administered 2022-09-10: 4 mg via INTRAVENOUS

## 2022-09-10 MED ORDER — METHOCARBAMOL 500 MG IVPB - SIMPLE MED
500.0000 mg | Freq: Four times a day (QID) | INTRAVENOUS | Status: DC | PRN
  Filled 2022-09-10: qty 55

## 2022-09-10 MED ORDER — OXYCODONE HCL 5 MG PO TABS: 5.0000 mg | ORAL_TABLET | ORAL | 0 refills | Status: DC | PRN

## 2022-09-10 MED ORDER — CELECOXIB 200 MG PO CAPS: 200.0000 mg | ORAL_CAPSULE | Freq: Two times a day (BID) | ORAL | 1 refills | Status: AC | PRN

## 2022-09-10 MED ORDER — POVIDONE-IODINE 10 % EX SWAB: 2.0000 | Freq: Once | CUTANEOUS | Status: DC

## 2022-09-10 MED ORDER — METOCLOPRAMIDE HCL 5 MG/ML IJ SOLN: 5.0000 mg | Freq: Three times a day (TID) | INTRAMUSCULAR | Status: DC | PRN

## 2022-09-10 MED ORDER — WATER FOR IRRIGATION, STERILE IR SOLN
Status: DC | PRN
  Administered 2022-09-10: 2000 mL

## 2022-09-10 MED ORDER — TRANEXAMIC ACID-NACL 1000-0.7 MG/100ML-% IV SOLN
INTRAVENOUS | Status: AC
  Administered 2022-09-10: 1000 mg via INTRAVENOUS
  Filled 2022-09-10: qty 100

## 2022-09-10 MED ORDER — HYDROMORPHONE HCL 1 MG/ML IJ SOLN
INTRAMUSCULAR | Status: DC | PRN
  Administered 2022-09-10 (×4): .2 mg via INTRAVENOUS

## 2022-09-10 MED ORDER — DEXAMETHASONE SODIUM PHOSPHATE 10 MG/ML IJ SOLN
8.0000 mg | Freq: Once | INTRAMUSCULAR | Status: AC
  Administered 2022-09-10: 8 mg via INTRAVENOUS

## 2022-09-10 MED ORDER — SENNA-DOCUSATE SODIUM 8.6-50 MG PO TABS: 2.0000 | ORAL_TABLET | Freq: Every day | ORAL | 1 refills | Status: DC | PRN

## 2022-09-10 MED ORDER — SODIUM CHLORIDE (PF) 0.9 % IJ SOLN
INTRAMUSCULAR | Status: AC
  Filled 2022-09-10: qty 30

## 2022-09-10 MED ORDER — OXYCODONE HCL 5 MG PO TABS: 5.0000 mg | ORAL_TABLET | ORAL | Status: DC | PRN

## 2022-09-10 MED ORDER — PROPOFOL 500 MG/50ML IV EMUL
INTRAVENOUS | Status: DC | PRN
  Administered 2022-09-10 (×2): 20 mg via INTRAVENOUS
  Administered 2022-09-10: 30 mg via INTRAVENOUS
  Administered 2022-09-10: 40 ug/kg/min via INTRAVENOUS

## 2022-09-10 MED ORDER — KETAMINE HCL 50 MG/5ML IJ SOSY
PREFILLED_SYRINGE | INTRAMUSCULAR | Status: AC
  Filled 2022-09-10: qty 5

## 2022-09-10 MED ORDER — METHOCARBAMOL 750 MG PO TABS: 750.0000 mg | ORAL_TABLET | Freq: Three times a day (TID) | ORAL | 0 refills | Status: DC | PRN

## 2022-09-10 MED ORDER — EPHEDRINE 5 MG/ML INJ
INTRAVENOUS | Status: AC
  Filled 2022-09-10: qty 5

## 2022-09-10 MED ORDER — OXYCODONE HCL 5 MG PO TABS
ORAL_TABLET | ORAL | Status: AC
  Filled 2022-09-10: qty 2

## 2022-09-10 MED ORDER — HYDROMORPHONE HCL 1 MG/ML IJ SOLN
0.2500 mg | INTRAMUSCULAR | Status: DC | PRN
  Administered 2022-09-10: 0.5 mg via INTRAVENOUS

## 2022-09-10 MED ORDER — ACETAMINOPHEN 500 MG PO TABS: 1000.0000 mg | ORAL_TABLET | Freq: Four times a day (QID) | ORAL | 0 refills | Status: AC | PRN

## 2022-09-10 MED ORDER — ACETAMINOPHEN 325 MG PO TABS: 325.0000 mg | ORAL_TABLET | Freq: Four times a day (QID) | ORAL | Status: DC | PRN

## 2022-09-10 MED ORDER — ACETAMINOPHEN 500 MG PO TABS
1000.0000 mg | ORAL_TABLET | Freq: Once | ORAL | Status: AC
  Administered 2022-09-10: 1000 mg via ORAL
  Filled 2022-09-10: qty 2

## 2022-09-10 MED ORDER — DEXAMETHASONE SODIUM PHOSPHATE 10 MG/ML IJ SOLN: 10.0000 mg | Freq: Once | INTRAMUSCULAR | Status: DC

## 2022-09-10 MED ORDER — BUPIVACAINE LIPOSOME 1.3 % IJ SUSP
INTRAMUSCULAR | Status: AC
  Filled 2022-09-10: qty 20

## 2022-09-10 MED ORDER — LACTATED RINGERS IV BOLUS: 500.0000 mL | Freq: Once | INTRAVENOUS | Status: AC

## 2022-09-10 MED ORDER — POLYETHYLENE GLYCOL 3350 17 G PO PACK: 17.0000 g | PACK | Freq: Every day | ORAL | Status: DC | PRN

## 2022-09-10 MED ORDER — BUPIVACAINE LIPOSOME 1.3 % IJ SUSP: 20.0000 mL | Freq: Once | INTRAMUSCULAR | Status: DC

## 2022-09-10 MED ORDER — HYDROMORPHONE HCL 2 MG/ML IJ SOLN
INTRAMUSCULAR | Status: AC
  Filled 2022-09-10: qty 1

## 2022-09-10 MED ORDER — METHOCARBAMOL 500 MG PO TABS: 500.0000 mg | ORAL_TABLET | Freq: Four times a day (QID) | ORAL | Status: DC | PRN

## 2022-09-10 MED ORDER — PANTOPRAZOLE SODIUM 40 MG PO TBEC: 40.0000 mg | DELAYED_RELEASE_TABLET | Freq: Every day | ORAL | Status: DC

## 2022-09-10 MED ORDER — ROPIVACAINE HCL 5 MG/ML IJ SOLN
INTRAMUSCULAR | Status: DC | PRN
  Administered 2022-09-10: 20 mL via PERINEURAL

## 2022-09-10 MED ORDER — DEXAMETHASONE SODIUM PHOSPHATE 10 MG/ML IJ SOLN
INTRAMUSCULAR | Status: AC
  Filled 2022-09-10: qty 1

## 2022-09-10 MED ORDER — PHENYLEPHRINE HCL-NACL 20-0.9 MG/250ML-% IV SOLN
INTRAVENOUS | Status: DC | PRN
  Administered 2022-09-10: 25 ug/min via INTRAVENOUS

## 2022-09-10 MED ORDER — BUPIVACAINE-EPINEPHRINE 0.25% -1:200000 IJ SOLN
INTRAMUSCULAR | Status: AC
  Filled 2022-09-10: qty 1

## 2022-09-10 MED ORDER — ACETAMINOPHEN 500 MG PO TABS: 1000.0000 mg | ORAL_TABLET | Freq: Four times a day (QID) | ORAL | Status: DC

## 2022-09-10 MED ORDER — ALUM & MAG HYDROXIDE-SIMETH 200-200-20 MG/5ML PO SUSP: 30.0000 mL | ORAL | Status: DC | PRN

## 2022-09-10 MED ORDER — BUPIVACAINE LIPOSOME 1.3 % IJ SUSP
INTRAMUSCULAR | Status: DC | PRN
  Administered 2022-09-10: 20 mL

## 2022-09-10 MED ORDER — ACETAMINOPHEN 500 MG PO TABS: 1000.0000 mg | ORAL_TABLET | Freq: Once | ORAL | Status: DC

## 2022-09-10 MED ORDER — FENTANYL CITRATE (PF) 100 MCG/2ML IJ SOLN
INTRAMUSCULAR | Status: AC
  Filled 2022-09-10: qty 2

## 2022-09-10 MED ORDER — EPHEDRINE SULFATE-NACL 50-0.9 MG/10ML-% IV SOSY
PREFILLED_SYRINGE | INTRAVENOUS | Status: DC | PRN
  Administered 2022-09-10: 10 mg via INTRAVENOUS

## 2022-09-10 MED ORDER — FENTANYL CITRATE PF 50 MCG/ML IJ SOSY
25.0000 ug | PREFILLED_SYRINGE | INTRAMUSCULAR | Status: DC | PRN
  Administered 2022-09-10: 50 ug via INTRAVENOUS

## 2022-09-10 MED ORDER — SODIUM CHLORIDE (PF) 0.9 % IJ SOLN
INTRAMUSCULAR | Status: DC | PRN
  Administered 2022-09-10: 30 mL

## 2022-09-10 MED ORDER — OXYCODONE HCL 5 MG PO TABS
10.0000 mg | ORAL_TABLET | ORAL | Status: DC | PRN
  Administered 2022-09-10: 10 mg via ORAL

## 2022-09-10 MED ORDER — MENTHOL 3 MG MT LOZG: 1.0000 | LOZENGE | OROMUCOSAL | Status: DC | PRN

## 2022-09-10 MED ORDER — TRAMADOL HCL 50 MG PO TABS: 50.0000 mg | ORAL_TABLET | Freq: Four times a day (QID) | ORAL | Status: DC

## 2022-09-10 SURGICAL SUPPLY — 54 items
BAG COUNTER SPONGE SURGICOUNT (BAG) IMPLANT
BAG SPNG CNTER NS LX DISP (BAG)
BLADE SAG 18X100X1.27 (BLADE) ×1 IMPLANT
BLADE SAGITTAL 25.0X1.37X90 (BLADE) ×1 IMPLANT
BLADE SURG 15 STRL LF DISP TIS (BLADE) ×1 IMPLANT
BLADE SURG 15 STRL SS (BLADE) ×1
BNDG CMPR MED 10X6 ELC LF (GAUZE/BANDAGES/DRESSINGS) ×1
BNDG ELASTIC 6X10 VLCR STRL LF (GAUZE/BANDAGES/DRESSINGS) ×1 IMPLANT
BOWL SMART MIX CTS (DISPOSABLE) IMPLANT
BSPLAT TIB 3 KN TRITANIUM (Knees) ×1 IMPLANT
CLSR STERI-STRIP ANTIMIC 1/2X4 (GAUZE/BANDAGES/DRESSINGS) ×2 IMPLANT
COMPONENT TRI CR RETAIN KNEE (Orthopedic Implant) IMPLANT
COVER SURGICAL LIGHT HANDLE (MISCELLANEOUS) ×1 IMPLANT
CUFF TOURN SGL QUICK 34 (TOURNIQUET CUFF) ×1
CUFF TRNQT CYL 34X4.125X (TOURNIQUET CUFF) ×1 IMPLANT
DRAPE U-SHAPE 47X51 STRL (DRAPES) ×1 IMPLANT
DRSG MEPILEX POST OP 4X12 (GAUZE/BANDAGES/DRESSINGS) ×1 IMPLANT
DURAPREP 26ML APPLICATOR (WOUND CARE) ×2 IMPLANT
ELECT REM PT RETURN 15FT ADLT (MISCELLANEOUS) ×1 IMPLANT
GLOVE BIO SURGEON STRL SZ7.5 (GLOVE) ×2 IMPLANT
GLOVE BIOGEL PI IND STRL 7.5 (GLOVE) ×1 IMPLANT
GLOVE BIOGEL PI IND STRL 8 (GLOVE) ×1 IMPLANT
GLOVE SURG SYN 7.5 E (GLOVE) ×1 IMPLANT
GLOVE SURG SYN 7.5 PF PI (GLOVE) ×1 IMPLANT
GOWN STRL REUS W/ TWL LRG LVL3 (GOWN DISPOSABLE) ×1 IMPLANT
GOWN STRL REUS W/ TWL XL LVL3 (GOWN DISPOSABLE) ×1 IMPLANT
GOWN STRL REUS W/TWL LRG LVL3 (GOWN DISPOSABLE) ×1
GOWN STRL REUS W/TWL XL LVL3 (GOWN DISPOSABLE) ×1
HANDPIECE INTERPULSE COAX TIP (DISPOSABLE) ×1
HOLDER FOLEY CATH W/STRAP (MISCELLANEOUS) IMPLANT
IMMOBILIZER KNEE 20 (SOFTGOODS) ×1
IMMOBILIZER KNEE 20 THIGH 36 (SOFTGOODS) IMPLANT
IMMOBILIZER KNEE 22 UNIV (SOFTGOODS) IMPLANT
INSERT TIB CS TRIATH X3 9 (Insert) IMPLANT
KIT TURNOVER KIT A (KITS) IMPLANT
KNEE PATELLA ASYMMETRIC 9X29 (Knees) IMPLANT
KNEE TIBIAL COMPONENT SZ3 (Knees) IMPLANT
MANIFOLD NEPTUNE II (INSTRUMENTS) ×1 IMPLANT
NS IRRIG 1000ML POUR BTL (IV SOLUTION) ×1 IMPLANT
PACK TOTAL KNEE CUSTOM (KITS) ×1 IMPLANT
PIN FLUTED HEDLESS FIX 3.5X1/8 (PIN) IMPLANT
PROTECTOR NERVE ULNAR (MISCELLANEOUS) ×1 IMPLANT
SET HNDPC FAN SPRY TIP SCT (DISPOSABLE) ×1 IMPLANT
SPIKE FLUID TRANSFER (MISCELLANEOUS) ×1 IMPLANT
SUT MNCRL AB 3-0 PS2 18 (SUTURE) ×1 IMPLANT
SUT VIC AB 0 CT1 36 (SUTURE) ×1 IMPLANT
SUT VIC AB 1 CT1 36 (SUTURE) ×1 IMPLANT
SUT VIC AB 2-0 CT1 27 (SUTURE) ×1
SUT VIC AB 2-0 CT1 TAPERPNT 27 (SUTURE) ×1 IMPLANT
TOWEL GREEN STERILE FF (TOWEL DISPOSABLE) ×1 IMPLANT
TRAY FOLEY MTR SLVR 16FR STAT (SET/KITS/TRAYS/PACK) IMPLANT
TRIA CRUCIATE RETAIN KNEE (Orthopedic Implant) ×1 IMPLANT
TUBE SUCTION HIGH CAP CLEAR NV (SUCTIONS) ×1 IMPLANT
WRAP KNEE MAXI GEL POST OP (GAUZE/BANDAGES/DRESSINGS) ×1 IMPLANT

## 2022-09-10 NOTE — Progress Notes (Signed)
Orthopedic Tech Progress Note Patient Details:  Kathy [name] [birth year] [id number]  Ortho Devices Type of Ortho Device: Bone foam zero knee Ortho Device/Splint Location: right Ortho Device/Splint Interventions: Ordered, Application, Adjustment   Post Interventions Patient Tolerated: Well Instructions Provided: Care of device, Adjustment of device  [name] 09/10/2022, 11:51 AM

## 2022-09-10 NOTE — Transfer of Care (Signed)
Immediate Anesthesia Transfer of Care Note  Patient: Kathy [name]  Procedure(s) Performed: TOTAL KNEE ARTHROPLASTY (Right: Knee)  Patient Location: PACU  Anesthesia Type:General  Level of Consciousness: awake, alert , oriented, and patient cooperative  Airway & Oxygen Therapy: Patient Spontanous Breathing and Patient connected to face mask oxygen  Post-op Assessment: Report given to RN, Post -op Vital signs reviewed and stable, and Patient moving all extremities  Post vital signs: Reviewed and stable  Last Vitals:  Vitals Value Taken Time  BP 156/75 09/10/22 0932  Temp    Pulse 76 09/10/22 0935  Resp 17 09/10/22 0935  SpO2 100 % 09/10/22 0935  Vitals shown include unfiled device data.  Last Pain:  Vitals:   09/10/22 0532  TempSrc: Oral         Complications: No notable events documented.

## 2022-09-10 NOTE — Anesthesia Postprocedure Evaluation (Signed)
Anesthesia Post Note  Patient: Kathy [name]  Procedure(s) Performed: TOTAL KNEE ARTHROPLASTY (Right: Knee)     Patient location during evaluation: PACU Anesthesia Type: Regional and General Level of consciousness: awake and alert Pain management: pain level controlled Vital Signs Assessment: post-procedure vital signs reviewed and stable Respiratory status: spontaneous breathing, nonlabored ventilation, respiratory function stable and patient connected to nasal cannula oxygen Cardiovascular status: blood pressure returned to baseline and stable Postop Assessment: no apparent nausea or vomiting Anesthetic complications: no  No notable events documented.  Last Vitals:  Vitals:   09/10/22 1130 09/10/22 1138  BP: (!) 171/82 (!) 161/75  Pulse: 76 87  Resp: 13 14  Temp:  36.7 C  SpO2: 92% 95%    Last Pain:  Vitals:   09/10/22 1153  TempSrc:   PainSc: 7                   L 

## 2022-09-10 NOTE — Discharge Instructions (Signed)

## 2022-09-10 NOTE — Anesthesia Procedure Notes (Signed)
Procedure Name: LMA Insertion Date/Time: 09/10/2022 7:41 AM  Performed by: Elisabeth Cara, CRNAPre-anesthesia Checklist: Patient identified, Emergency Drugs available, Suction available, Patient being monitored and Timeout performed Patient Re-evaluated:Patient Re-evaluated prior to induction Oxygen Delivery Method: Circle system utilized Preoxygenation: Pre-oxygenation with 100% oxygen Induction Type: IV induction LMA: LMA with gastric port inserted LMA Size: 4.0 Number of attempts: 1 Placement Confirmation: positive ETCO2 and breath sounds checked- equal and bilateral Tube secured with: Tape Dental Injury: Teeth and Oropharynx as per pre-operative assessment  Comments: Head maintained in a neutral position

## 2022-09-10 NOTE — Op Note (Signed)
DATE OF SURGERY:  09/10/2022 TIME: 9:38 AM  PATIENT NAME:  Kathy [name]   AGE: 67 y.o.    PRE-OPERATIVE DIAGNOSIS:  OA RIGHT KNEE  POST-OPERATIVE DIAGNOSIS:  Same  PROCEDURE:  Procedure(s): TOTAL KNEE ARTHROPLASTY   SURGEON:  Sheral Apley, MD   ASSISTANT:  Levester Fresh, PA-C, she was present and scrubbed throughout the case, critical for completion in a timely fashion, and for retraction, instrumentation, and closure.    OPERATIVE IMPLANTS: [employer] Triathlon CR. Press fit knee  Femur size 3, Tibia size 3, Patella size 29 3-peg oval button, with a 9 mm polyethylene insert.   PREOPERATIVE INDICATIONS:  Kathy [name] is a 67 y.o. year old female with end stage bone on bone degenerative arthritis of the knee who failed conservative treatment, including injections, antiinflammatories, activity modification, and assistive devices, and had significant impairment of their activities of daily living, and elected for Total Knee Arthroplasty.   The risks, benefits, and alternatives were discussed at length including but not limited to the risks of infection, bleeding, nerve injury, stiffness, blood clots, the need for revision surgery, cardiopulmonary complications, among others, and they were willing to proceed.   OPERATIVE DESCRIPTION:  The patient was brought to the operative room and placed in a supine position.  General anesthesia was administered.  IV antibiotics were given.  The lower extremity was prepped and draped in the usual sterile fashion.  Time out was performed.  The leg was elevated and exsanguinated and the tourniquet was inflated.  Anterior approach was performed.  The patella was everted and osteophytes were removed.  The anterior horn of the medial and lateral meniscus was removed.   The distal femur was opened with the drill and the intramedullary distal femoral cutting jig was utilized, set at 5 degrees resecting 8 mm off the distal femur.  Care was taken  to protect the collateral ligaments.  The distal femoral sizing jig was applied, taking care to avoid notching.  Then the 4-in-1 cutting jig was applied and the anterior and posterior femur was cut, along with the chamfer cuts.  All posterior osteophytes were removed.  The flexion gap was then measured and was symmetric with the extension gap.  Then the extramedullary tibial cutting jig was utilized making the appropriate cut using the anterior tibial crest as a reference building in appropriate posterior slope.  Care was taken during the cut to protect the medial and collateral ligaments.  The proximal tibia was removed along with the posterior horns of the menisci.    I completed the distal femoral preparation using the appropriate jig to prepare the box.  The patella was then measured, and cut with the saw.    The proximal tibia sized and prepared accordingly with the reamer and the punch, and then all components were trialed with the above sized poly insert.  The knee was found to have excellent balance and full motion.    The above named components were then impacted into place and Poly tibial piece and patella were inserted.  I was very happy with his stability and ROM  I performed a periarticular injection with Exparel  The knee was easily taken through a range of motion and the patella tracked well and the knee irrigated copiously and the parapatellar and subcutaneous tissue closed with vicryl, and monocryl with steri strips for the skin.  The incision was dressed with sterile gauze and the tourniquet released and the patient was awakened and returned to the  PACU in stable and satisfactory condition.  There were no complications.  Total tourniquet time was roughly 60 minutes.   POSTOPERATIVE PLAN: post op Abx, DVT px: SCD's, TED's, Early ambulation and chemical px

## 2022-09-10 NOTE — Interval H&P Note (Signed)
History and Physical Interval Note:  09/10/2022 6:57 AM  Kathy [name]  has presented today for surgery, with the diagnosis of OA RIGHT KNEE.  The various methods of treatment have been discussed with the patient and family. After consideration of risks, benefits and other options for treatment, the patient has consented to  Procedure(s): TOTAL KNEE ARTHROPLASTY (Right) as a surgical intervention.  The patient's history has been reviewed, patient examined, no change in status, stable for surgery.  I have reviewed the patient's chart and labs.  Questions were answered to the patient's satisfaction.     Sheral Apley

## 2022-09-10 NOTE — Evaluation (Signed)
Physical Therapy Evaluation Patient Details Name: Kathy [name] MRN: [id number] DOB: Jan 20, 1956 Today's Date: 09/10/2022  History of Present Illness  67 yo female presents to therapy s/p R TKA on 09/10/2022 due to failure of conservative measures. Pt PMH includes but is not limited to: aortic atherosclerosis, fibromyalgia, GERD, HTN, hypothyroidism, B shoulder surgery, cervical and lumbar surgery.  Clinical Impression    Kathy [name] is a 67 y.o. female POD 0 s/p R TKA. Patient reports mod I with intermittent use of SPC with mobility at baseline. Patient is now limited by functional impairments (see PT problem list below) and requires CGA and cues for transfers and gait with RW. Patient was able to ambulate 35 feet x 2 with RW and CGA and cues for safe walker management. Patient educated on safe sequencing for functional mobility tasks including car transfers, fall risk prevention, use of RW, pain goal and management, CP/Ice pt and friend  verbalized understanding of safe guarding position for people assisting with mobility. Patient instructed in exercises to facilitate ROM and circulation. Patient will benefit from continued skilled PT interventions to address impairments and progress towards PLOF. Patient has met mobility goals at adequate level for discharge home with strong social support and OPPT starting 8/8; will continue to follow if pt continues acute stay to progress towards Mod I goals.       If plan is discharge home, recommend the following: A little help with walking and/or transfers;A little help with bathing/dressing/bathroom;Assistance with cooking/housework;Assist for transportation;Help with stairs or ramp for entrance   Can travel by private vehicle        Equipment Recommendations Rolling walker (2 wheels) (youth, provided and adjusted at eval)  Recommendations for Other Services       Functional Status Assessment Patient has had a recent decline in their functional  status and demonstrates the ability to make significant improvements in function in a reasonable and predictable amount of time.     Precautions / Restrictions Precautions Precautions: Knee;Fall Restrictions Weight Bearing Restrictions: No      Mobility  Bed Mobility Overal bed mobility: Needs Assistance Bed Mobility: Supine to Sit     Supine to sit: Min guard     General bed mobility comments: min cues    Transfers Overall transfer level: Needs assistance Equipment used: Rolling walker (2 wheels) Transfers: Sit to/from Stand Sit to Stand: Min guard           General transfer comment: cues for safety proper UE and AD placement for bed, recliner and commdoe transfers    Ambulation/Gait Ambulation/Gait assistance: Min guard Gait Distance (Feet): 35 Feet Assistive device: Rolling walker (2 wheels) Gait Pattern/deviations: Step-to pattern, Antalgic, Trunk flexed Gait velocity: decreased     General Gait Details: step to pattern with limited R knee flexion t/o gait cycle  Stairs            Wheelchair Mobility     Tilt Bed    Modified Rankin (Stroke Patients Only)       Balance Overall balance assessment: Needs assistance Sitting-balance support: Feet supported Sitting balance-Leahy Scale: Good     Standing balance support: Bilateral upper extremity supported, During functional activity, Reliant on assistive device for balance Standing balance-Leahy Scale: Fair Standing balance comment: static standing without UE support                             Pertinent Vitals/Pain Pain Assessment Pain Assessment:  0-10 Pain Score: 8  Pain Descriptors / Indicators: Aching, Constant, Discomfort, Operative site guarding Pain Intervention(s): Limited activity within patient's tolerance, Monitored during session, Premedicated before session, Repositioned, Ice applied    Home Living Family/patient expects to be discharged to:: Private  residence Living Arrangements: Alone Available Help at Discharge: Friend(s) Type of Home: Apartment (second floor Sr. Living apartment) Home Access: Level entry       Home Layout: One level Home Equipment: Cane - single point      Prior Function Prior Level of Function : Independent/Modified Independent;Driving             Mobility Comments: intermittent use of SPC for mobility mod I with ADLs, self care tasks, IADLs       Hand Dominance        Extremity/Trunk Assessment        Lower Extremity Assessment Lower Extremity Assessment: RLE deficits/detail RLE Deficits / Details: ankle DF/PF 5/5; SLR < 10 degree lag RLE Sensation: decreased light touch    Cervical / Trunk Assessment Cervical / Trunk Assessment: Back Surgery;Neck Surgery (head forward)  Communication   Communication: No difficulties  Cognition Arousal/Alertness: Lethargic, Suspect due to medications Behavior During Therapy: WFL for tasks assessed/performed Overall Cognitive Status: Within Functional Limits for tasks assessed                                          General Comments      Exercises Total Joint Exercises Ankle Circles/Pumps: AROM, Both, 20 reps Quad Sets: AROM, Right, 5 reps Short Arc Quad: AROM, Right, 5 reps Heel Slides: AROM, Right, 5 reps Hip ABduction/ADduction: AROM, Right, 5 reps, Supine Straight Leg Raises: AROM, Right, 5 reps Knee Flexion: AROM, Right, 5 reps, Seated   Assessment/Plan    PT Assessment Patient needs continued PT services  PT Problem List Decreased strength;Decreased range of motion;Decreased activity tolerance;Decreased balance;Decreased mobility;Decreased coordination;Pain       PT Treatment Interventions DME instruction;Gait training;Functional mobility training;Therapeutic activities;Therapeutic exercise;Balance training;Neuromuscular re-education;Patient/family education;Modalities    PT Goals (Current goals can be found in the  Care Plan section)  Acute Rehab PT Goals Patient Stated Goal: to be able to walk again and maybe play golf PT Goal Formulation: With patient Time For Goal Achievement: 09/24/22 Potential to Achieve Goals: Good    Frequency 7X/week     Co-evaluation               AM-PAC PT "6 Clicks" Mobility  Outcome Measure Help needed turning from your back to your side while in a flat bed without using bedrails?: A Little Help needed moving from lying on your back to sitting on the side of a flat bed without using bedrails?: A Little Help needed moving to and from a bed to a chair (including a wheelchair)?: A Little Help needed standing up from a chair using your arms (e.g., wheelchair or bedside chair)?: A Little Help needed to walk in hospital room?: A Little Help needed climbing 3-5 steps with a railing? : A Lot 6 Click Score: 17    End of Session Equipment Utilized During Treatment: Gait belt       PT Visit Diagnosis: Unsteadiness on feet (R26.81);Other abnormalities of gait and mobility (R26.89);Muscle weakness (generalized) (M62.81);Difficulty in walking, not elsewhere classified (R26.2);Pain Pain - Right/Left: Right Pain - part of body: Knee;Leg    Time: 7829-5621 PT Time  Calculation (min) (ACUTE ONLY): 39 min   Charges:   PT Evaluation $PT Eval Low Complexity: 1 Low PT Treatments $Gait Training: 8-22 mins $Therapeutic Exercise: 8-22 mins PT General Charges $$ ACUTE PT VISIT: 1 Visit         Johnny Bridge, PT Acute Rehab   Jacqualyn Posey 09/10/2022, 1:49 PM

## 2022-09-10 NOTE — Anesthesia Procedure Notes (Signed)
Anesthesia Regional Block: Adductor canal block   Pre-Anesthetic Checklist: , timeout performed,  Correct Patient, Correct Site, Correct Laterality,  Correct Procedure, Correct Position, site marked,  Risks and benefits discussed,  Pre-op evaluation,  At surgeon's request and post-op pain management  Laterality: Right  Prep: Maximum Sterile Barrier Precautions used, chloraprep       Needles:  Injection technique: Single-shot  Needle Type: Echogenic Stimulator Needle     Needle Length: 9cm  Needle Gauge: 21     Additional Needles:   Procedures:,,,, ultrasound used (permanent image in chart),,    Narrative:  Start time: 09/10/2022 7:00 AM End time: 09/10/2022 7:04 AM Injection made incrementally with aspirations every 5 mL. Anesthesiologist: Elmer Picker, MD

## 2022-09-11 ENCOUNTER — Emergency Department (HOSPITAL_COMMUNITY)
Admission: EM | Admit: 2022-09-11 | Discharge: 2022-09-11 | Disposition: A | Discharge status: Home / Self Care | Payer: Medicare HMO | Attending: Emergency Medicine | Admitting: Emergency Medicine

## 2022-09-11 ENCOUNTER — Encounter (HOSPITAL_COMMUNITY): Payer: Self-pay

## 2022-09-11 DIAGNOSIS — I10 Essential (primary) hypertension: Secondary | ICD-10-CM | POA: Diagnosis not present

## 2022-09-11 DIAGNOSIS — Z96652 Presence of left artificial knee joint: Secondary | ICD-10-CM | POA: Insufficient documentation

## 2022-09-11 DIAGNOSIS — M96830 Postprocedural hemorrhage and hematoma of a musculoskeletal structure following a musculoskeletal system procedure: Secondary | ICD-10-CM | POA: Insufficient documentation

## 2022-09-11 DIAGNOSIS — T148XXA Other injury of unspecified body region, initial encounter: Secondary | ICD-10-CM

## 2022-09-11 DIAGNOSIS — Z79899 Other long term (current) drug therapy: Secondary | ICD-10-CM | POA: Diagnosis not present

## 2022-09-11 DIAGNOSIS — Z7982 Long term (current) use of aspirin: Secondary | ICD-10-CM | POA: Insufficient documentation

## 2022-09-11 MED ORDER — OXYCODONE-ACETAMINOPHEN 5-325 MG PO TABS
1.0000 | ORAL_TABLET | Freq: Once | ORAL | Status: AC
  Administered 2022-09-11: 1 via ORAL
  Filled 2022-09-11: qty 1

## 2022-09-11 NOTE — Discharge Instructions (Signed)
Keep your leg straight today, then resume your therapy tomorrow.

## 2022-09-11 NOTE — ED Triage Notes (Signed)
Pt arrived POV for right knee pain and bleeding. Pt had a total knee replacement 8/6 here at [facility], noted blood leaking through bandage, pt has not removed wrap to assess bleeding. Reports 8/10 pain, otherwise NAD noted, A&Ox4.

## 2022-09-11 NOTE — ED Provider Notes (Signed)
[place] EMERGENCY DEPARTMENT AT [facility] Provider Note   CSN: [id number] Arrival date & time: 09/11/22  0310     History  Chief Complaint  Patient presents with   Post-op Problem    Kathy [name] is a 67 y.o. female.  The history is provided by the patient.  She has history of hypertension, prediabetes and is status post right knee total arthroplasty done yesterday and comes in because blood is seeping through her dressings.  She has moderate pain but not what she considers out of line for just having had her knee surgery.  She is not on any anticoagulants.  She was prescribed low-dose aspirin for prophylaxis against DVT.   Home Medications Prior to Admission medications   Medication Sig Start Date End Date Taking? Authorizing Provider  acetaminophen (TYLENOL) 500 MG tablet Take 2 tablets (1,000 mg total) by mouth every 6 (six) hours as needed for mild pain or moderate pain. 09/10/22   Jenne Pane, PA-C  albuterol (VENTOLIN HFA) 108 (90 Base) MCG/ACT inhaler Inhale 1-2 puffs into the lungs every 6 (six) hours as needed for wheezing or shortness of breath.    [provider]  aspirin EC 81 MG tablet Take 1 tablet (81 mg total) by mouth 2 (two) times daily. To prevent blood clots for 30 days after surgery. 09/10/22   Jenne Pane, PA-C  budesonide-formoterol (SYMBICORT) 160-4.5 MCG/ACT inhaler Inhale 2 puffs into the lungs 2 (two) times daily.    [provider]  celecoxib (CELEBREX) 200 MG capsule Take 1 capsule (200 mg total) by mouth 2 (two) times daily as needed for mild pain (and swelling). 09/10/22   Jenne Pane, PA-C  diclofenac Sodium (VOLTAREN) 1 % GEL Apply 2 g topically daily as needed (pain).    [provider]  estradiol (ESTRACE) 0.5 MG tablet Take 0.5 mg by mouth daily.     [provider]  famotidine (PEPCID) 40 MG tablet Take 40 mg by mouth 2 (two) times daily.    [provider]  FLUoxetine (PROZAC)  20 MG capsule Take 20 mg by mouth daily.    [provider]  FLUoxetine (PROZAC) 40 MG capsule Take 40 mg by mouth every morning.    [provider]  fluticasone (FLONASE) 50 MCG/ACT nasal spray Place 2 sprays into both nostrils 2 (two) times daily.    [provider]  hydrochlorothiazide (HYDRODIURIL) 12.5 MG tablet Take 12.5 mg by mouth daily.    [provider]  levothyroxine (SYNTHROID) 50 MCG tablet Take 50 mcg by mouth daily before breakfast.    [provider]  methocarbamol (ROBAXIN-750) 750 MG tablet Take 1 tablet (750 mg total) by mouth every 8 (eight) hours as needed for muscle spasms. DO NOT TAKE FLEXERIL OR TIZANIDINE WHEN USING THIS MEDICINE! 09/10/22   Jenne Pane, PA-C  metoprolol tartrate (LOPRESSOR) 100 MG tablet Take 1 tablet (100 mg total) by mouth once for 1 dose. Please take this medication 2 hours before CT Patient not taking: Reported on 08/23/2022 06/18/22 06/18/22  Georgeanna Lea, MD  montelukast (SINGULAIR) 10 MG tablet Take 10 mg by mouth daily. 09/28/21   [provider]  ondansetron (ZOFRAN-ODT) 4 MG disintegrating tablet Take 1 tablet (4 mg total) by mouth every 8 (eight) hours as needed for nausea or vomiting. 09/10/22   Jenne Pane, PA-C  oxyCODONE (ROXICODONE) 5 MG immediate release tablet Take 1 tablet (5 mg total) by mouth every  4 (four) hours as needed for severe pain. 09/10/22   Jenne Pane, PA-C  pramipexole (MIRAPEX) 1.5 MG tablet Take 1.5 mg by mouth 3 (three) times daily.    [provider]  sennosides-docusate sodium (SENOKOT-S) 8.6-50 MG tablet Take 2 tablets by mouth daily as needed for constipation (while taking narcotics). 09/10/22   Jenne Pane, PA-C      Allergies    Gabapentin, Amlodipine, Clonazepam, Losartan, Mobic [meloxicam], Naproxen sodium, and Lisinopril    Review of Systems   Review of Systems  All other systems reviewed and are negative.   Physical Exam Updated  Vital Signs BP (!) 175/67   Pulse 91   Temp (!) 97.5 F (36.4 C) (Oral)   Resp 16   Ht 5\' 3"  (1.6 m)   Wt 73.9 kg   SpO2 93%   BMI 28.87 kg/m  Physical Exam Vitals and nursing note reviewed.   67 year old female, resting comfortably and in no acute distress. Vital signs are significant for elevated blood pressure. Oxygen saturation is 93%, which is normal. Head is normocephalic and atraumatic. PERRLA, EOMI.  Lungs are clear without rales, wheezes, or rhonchi. Chest is nontender. Heart has regular rate and rhythm without murmur. Abdomen is soft, flat, nontender. Extremities: Right leg has a elastic bandage with blood staining in the anteromedial aspect of the right knee.  I removed the bandage and underneath there is a Hydro cellulose dressing with blood coming from the lateral aspect at the knee joint.  I removed the dressing and there is a small amount of fresh blood present, but when cleaned with a sterile gauze, no active bleeding was noted. Skin is warm and dry without rash. Neurologic: Awake and alert.  ED Results / Procedures / Treatments    Procedures Procedures    Medications Ordered in ED Medications - No data to display  ED Course/ Medical Decision Making/ A&P                                 Medical Decision Making Risk Prescription drug management.   Postoperative bleeding following right total knee arthroplasty which seems to have stopped.  I had to remove 2 Steri-Strips which were bloodsoaked and no longer adherent.    I observe the patient in the ED and there was very minimal oozing from the site, only apparent after 20-30 minutes.  I have discussed the case with her surgeon, Dr. Eulah Pont and decision was made to try to apply tissue adhesive and new Steri-Strips over the wound and then reapply a Hydro cellulose dressing.    I have applied tissue adhesive over that area, then applied a new Hydro cellulose dressing.  Dr. Eulah Pont request patient keep the leg  straight for the next 24 hours to minimize stress on the wound and to allow it to heal.  Final Clinical Impression(s) / ED Diagnoses Final diagnoses:  Bleeding from wound  Elevated blood pressure reading with diagnosis of hypertension    Rx / DC Orders ED Discharge Orders     None         Dione Booze, MD 09/11/22 [phone]

## 2022-09-18 ENCOUNTER — Ambulatory Visit: Payer: Medicare HMO | Admitting: Cardiology

## 2023-02-10 ENCOUNTER — Other Ambulatory Visit: Payer: Self-pay | Admitting: Family Medicine

## 2023-02-10 DIAGNOSIS — Z1231 Encounter for screening mammogram for malignant neoplasm of breast: Secondary | ICD-10-CM

## 2023-02-12 ENCOUNTER — Ambulatory Visit
Admission: RE | Admit: 2023-02-12 | Discharge: 2023-02-12 | Disposition: A | Discharge status: Home / Self Care | Payer: Medicare Other | Source: Ambulatory Visit | Attending: Family Medicine | Admitting: Family Medicine

## 2023-02-12 ENCOUNTER — Inpatient Hospital Stay: Admission: RE | Admit: 2023-02-12 | Payer: No Typology Code available for payment source | Source: Ambulatory Visit

## 2023-02-12 DIAGNOSIS — Z1231 Encounter for screening mammogram for malignant neoplasm of breast: Secondary | ICD-10-CM | POA: Diagnosis not present

## 2023-02-18 DIAGNOSIS — R5383 Other fatigue: Secondary | ICD-10-CM | POA: Diagnosis not present

## 2023-02-18 DIAGNOSIS — G4733 Obstructive sleep apnea (adult) (pediatric): Secondary | ICD-10-CM | POA: Diagnosis not present

## 2023-03-03 DIAGNOSIS — Z6829 Body mass index (BMI) 29.0-29.9, adult: Secondary | ICD-10-CM | POA: Diagnosis not present

## 2023-03-03 DIAGNOSIS — Z20822 Contact with and (suspected) exposure to covid-19: Secondary | ICD-10-CM | POA: Diagnosis not present

## 2023-03-03 DIAGNOSIS — R059 Cough, unspecified: Secondary | ICD-10-CM | POA: Diagnosis not present

## 2023-03-03 DIAGNOSIS — U071 COVID-19: Secondary | ICD-10-CM | POA: Diagnosis not present

## 2023-03-07 DIAGNOSIS — U071 COVID-19: Secondary | ICD-10-CM | POA: Diagnosis not present

## 2023-03-10 DIAGNOSIS — R9431 Abnormal electrocardiogram [ECG] [EKG]: Secondary | ICD-10-CM | POA: Diagnosis not present

## 2023-03-10 DIAGNOSIS — E039 Hypothyroidism, unspecified: Secondary | ICD-10-CM | POA: Diagnosis not present

## 2023-03-10 DIAGNOSIS — I10 Essential (primary) hypertension: Secondary | ICD-10-CM | POA: Diagnosis not present

## 2023-03-10 DIAGNOSIS — R0602 Shortness of breath: Secondary | ICD-10-CM | POA: Diagnosis not present

## 2023-03-10 DIAGNOSIS — R079 Chest pain, unspecified: Secondary | ICD-10-CM | POA: Diagnosis not present

## 2023-03-10 DIAGNOSIS — Z8616 Personal history of COVID-19: Secondary | ICD-10-CM | POA: Diagnosis not present

## 2023-03-10 DIAGNOSIS — E7800 Pure hypercholesterolemia, unspecified: Secondary | ICD-10-CM | POA: Diagnosis not present

## 2023-03-12 DIAGNOSIS — G2581 Restless legs syndrome: Secondary | ICD-10-CM | POA: Diagnosis not present

## 2023-03-12 DIAGNOSIS — J4530 Mild persistent asthma, uncomplicated: Secondary | ICD-10-CM | POA: Diagnosis not present

## 2023-03-12 DIAGNOSIS — G4733 Obstructive sleep apnea (adult) (pediatric): Secondary | ICD-10-CM | POA: Diagnosis not present

## 2023-03-12 DIAGNOSIS — J479 Bronchiectasis, uncomplicated: Secondary | ICD-10-CM | POA: Diagnosis not present

## 2023-03-26 DIAGNOSIS — D649 Anemia, unspecified: Secondary | ICD-10-CM | POA: Diagnosis not present

## 2023-03-26 DIAGNOSIS — E039 Hypothyroidism, unspecified: Secondary | ICD-10-CM | POA: Diagnosis not present

## 2023-03-31 DIAGNOSIS — G4733 Obstructive sleep apnea (adult) (pediatric): Secondary | ICD-10-CM | POA: Diagnosis not present

## 2023-03-31 DIAGNOSIS — Z6830 Body mass index (BMI) 30.0-30.9, adult: Secondary | ICD-10-CM | POA: Diagnosis not present

## 2023-04-01 DIAGNOSIS — I10 Essential (primary) hypertension: Secondary | ICD-10-CM | POA: Diagnosis not present

## 2023-04-01 DIAGNOSIS — Z6830 Body mass index (BMI) 30.0-30.9, adult: Secondary | ICD-10-CM | POA: Diagnosis not present

## 2023-04-01 DIAGNOSIS — E039 Hypothyroidism, unspecified: Secondary | ICD-10-CM | POA: Diagnosis not present

## 2023-04-01 DIAGNOSIS — K219 Gastro-esophageal reflux disease without esophagitis: Secondary | ICD-10-CM | POA: Diagnosis not present

## 2023-04-01 DIAGNOSIS — E538 Deficiency of other specified B group vitamins: Secondary | ICD-10-CM | POA: Diagnosis not present

## 2023-04-01 DIAGNOSIS — N3946 Mixed incontinence: Secondary | ICD-10-CM | POA: Diagnosis not present

## 2023-04-08 DIAGNOSIS — G2581 Restless legs syndrome: Secondary | ICD-10-CM | POA: Diagnosis not present

## 2023-04-08 DIAGNOSIS — J479 Bronchiectasis, uncomplicated: Secondary | ICD-10-CM | POA: Diagnosis not present

## 2023-04-08 DIAGNOSIS — J4530 Mild persistent asthma, uncomplicated: Secondary | ICD-10-CM | POA: Diagnosis not present

## 2023-04-08 DIAGNOSIS — J101 Influenza due to other identified influenza virus with other respiratory manifestations: Secondary | ICD-10-CM | POA: Diagnosis not present

## 2023-04-11 DIAGNOSIS — Z79899 Other long term (current) drug therapy: Secondary | ICD-10-CM | POA: Diagnosis not present

## 2023-04-11 DIAGNOSIS — E039 Hypothyroidism, unspecified: Secondary | ICD-10-CM | POA: Diagnosis not present

## 2023-04-11 DIAGNOSIS — J111 Influenza due to unidentified influenza virus with other respiratory manifestations: Secondary | ICD-10-CM | POA: Diagnosis not present

## 2023-04-11 DIAGNOSIS — E860 Dehydration: Secondary | ICD-10-CM | POA: Diagnosis not present

## 2023-04-11 DIAGNOSIS — E876 Hypokalemia: Secondary | ICD-10-CM | POA: Diagnosis not present

## 2023-04-11 DIAGNOSIS — N179 Acute kidney failure, unspecified: Secondary | ICD-10-CM | POA: Diagnosis not present

## 2023-04-11 DIAGNOSIS — I10 Essential (primary) hypertension: Secondary | ICD-10-CM | POA: Diagnosis not present

## 2023-04-11 DIAGNOSIS — F419 Anxiety disorder, unspecified: Secondary | ICD-10-CM | POA: Diagnosis not present

## 2023-04-11 DIAGNOSIS — J1008 Influenza due to other identified influenza virus with other specified pneumonia: Secondary | ICD-10-CM | POA: Diagnosis not present

## 2023-04-11 DIAGNOSIS — E871 Hypo-osmolality and hyponatremia: Secondary | ICD-10-CM | POA: Diagnosis not present

## 2023-04-11 DIAGNOSIS — D509 Iron deficiency anemia, unspecified: Secondary | ICD-10-CM | POA: Diagnosis not present

## 2023-04-11 DIAGNOSIS — R531 Weakness: Secondary | ICD-10-CM | POA: Diagnosis not present

## 2023-04-11 DIAGNOSIS — T50905A Adverse effect of unspecified drugs, medicaments and biological substances, initial encounter: Secondary | ICD-10-CM | POA: Diagnosis not present

## 2023-04-11 DIAGNOSIS — Z888 Allergy status to other drugs, medicaments and biological substances status: Secondary | ICD-10-CM | POA: Diagnosis not present

## 2023-04-11 DIAGNOSIS — I959 Hypotension, unspecified: Secondary | ICD-10-CM | POA: Diagnosis not present

## 2023-04-11 DIAGNOSIS — J440 Chronic obstructive pulmonary disease with acute lower respiratory infection: Secondary | ICD-10-CM | POA: Diagnosis not present

## 2023-04-11 DIAGNOSIS — M1990 Unspecified osteoarthritis, unspecified site: Secondary | ICD-10-CM | POA: Diagnosis not present

## 2023-04-11 DIAGNOSIS — E7800 Pure hypercholesterolemia, unspecified: Secondary | ICD-10-CM | POA: Diagnosis not present

## 2023-04-11 DIAGNOSIS — Z886 Allergy status to analgesic agent status: Secondary | ICD-10-CM | POA: Diagnosis not present

## 2023-04-11 DIAGNOSIS — T375X5A Adverse effect of antiviral drugs, initial encounter: Secondary | ICD-10-CM | POA: Diagnosis not present

## 2023-04-11 DIAGNOSIS — Z7951 Long term (current) use of inhaled steroids: Secondary | ICD-10-CM | POA: Diagnosis not present

## 2023-04-11 DIAGNOSIS — F32A Depression, unspecified: Secondary | ICD-10-CM | POA: Diagnosis not present

## 2023-04-11 DIAGNOSIS — Z87440 Personal history of urinary (tract) infections: Secondary | ICD-10-CM | POA: Diagnosis not present

## 2023-04-11 DIAGNOSIS — Z791 Long term (current) use of non-steroidal anti-inflammatories (NSAID): Secondary | ICD-10-CM | POA: Diagnosis not present

## 2023-04-11 DIAGNOSIS — R112 Nausea with vomiting, unspecified: Secondary | ICD-10-CM | POA: Diagnosis not present

## 2023-04-11 DIAGNOSIS — J159 Unspecified bacterial pneumonia: Secondary | ICD-10-CM | POA: Diagnosis not present

## 2023-04-11 DIAGNOSIS — Z8711 Personal history of peptic ulcer disease: Secondary | ICD-10-CM | POA: Diagnosis not present

## 2023-04-15 DIAGNOSIS — Z6830 Body mass index (BMI) 30.0-30.9, adult: Secondary | ICD-10-CM | POA: Diagnosis not present

## 2023-04-15 DIAGNOSIS — B001 Herpesviral vesicular dermatitis: Secondary | ICD-10-CM | POA: Diagnosis not present

## 2023-04-15 DIAGNOSIS — J1100 Influenza due to unidentified influenza virus with unspecified type of pneumonia: Secondary | ICD-10-CM | POA: Diagnosis not present

## 2023-04-17 DIAGNOSIS — J189 Pneumonia, unspecified organism: Secondary | ICD-10-CM | POA: Diagnosis not present

## 2023-04-17 DIAGNOSIS — B029 Zoster without complications: Secondary | ICD-10-CM | POA: Diagnosis not present

## 2023-04-17 DIAGNOSIS — R059 Cough, unspecified: Secondary | ICD-10-CM | POA: Diagnosis not present

## 2023-04-17 DIAGNOSIS — I10 Essential (primary) hypertension: Secondary | ICD-10-CM | POA: Diagnosis not present

## 2023-04-17 DIAGNOSIS — J440 Chronic obstructive pulmonary disease with acute lower respiratory infection: Secondary | ICD-10-CM | POA: Diagnosis not present

## 2023-04-17 DIAGNOSIS — J449 Chronic obstructive pulmonary disease, unspecified: Secondary | ICD-10-CM | POA: Diagnosis not present

## 2023-04-17 DIAGNOSIS — R0602 Shortness of breath: Secondary | ICD-10-CM | POA: Diagnosis not present

## 2023-04-17 DIAGNOSIS — I509 Heart failure, unspecified: Secondary | ICD-10-CM | POA: Diagnosis not present

## 2023-04-17 DIAGNOSIS — E785 Hyperlipidemia, unspecified: Secondary | ICD-10-CM | POA: Diagnosis not present

## 2023-04-17 DIAGNOSIS — Z79899 Other long term (current) drug therapy: Secondary | ICD-10-CM | POA: Diagnosis not present

## 2023-04-17 DIAGNOSIS — I110 Hypertensive heart disease with heart failure: Secondary | ICD-10-CM | POA: Diagnosis not present

## 2023-04-18 DIAGNOSIS — J189 Pneumonia, unspecified organism: Secondary | ICD-10-CM | POA: Diagnosis not present

## 2023-04-18 DIAGNOSIS — B029 Zoster without complications: Secondary | ICD-10-CM | POA: Diagnosis not present

## 2023-04-18 DIAGNOSIS — J9811 Atelectasis: Secondary | ICD-10-CM | POA: Diagnosis not present

## 2023-04-18 DIAGNOSIS — I509 Heart failure, unspecified: Secondary | ICD-10-CM | POA: Diagnosis not present

## 2023-04-19 DIAGNOSIS — B029 Zoster without complications: Secondary | ICD-10-CM | POA: Diagnosis not present

## 2023-04-19 DIAGNOSIS — I509 Heart failure, unspecified: Secondary | ICD-10-CM | POA: Diagnosis not present

## 2023-04-19 DIAGNOSIS — J189 Pneumonia, unspecified organism: Secondary | ICD-10-CM | POA: Diagnosis not present

## 2023-05-08 DIAGNOSIS — M25572 Pain in left ankle and joints of left foot: Secondary | ICD-10-CM | POA: Diagnosis not present

## 2023-05-08 DIAGNOSIS — M25561 Pain in right knee: Secondary | ICD-10-CM | POA: Diagnosis not present

## 2023-05-14 ENCOUNTER — Other Ambulatory Visit: Payer: Self-pay | Admitting: Otolaryngology

## 2023-05-16 DIAGNOSIS — M25561 Pain in right knee: Secondary | ICD-10-CM | POA: Diagnosis not present

## 2023-05-16 DIAGNOSIS — M25572 Pain in left ankle and joints of left foot: Secondary | ICD-10-CM | POA: Diagnosis not present

## 2023-05-22 DIAGNOSIS — Z6829 Body mass index (BMI) 29.0-29.9, adult: Secondary | ICD-10-CM | POA: Diagnosis not present

## 2023-05-22 DIAGNOSIS — E039 Hypothyroidism, unspecified: Secondary | ICD-10-CM | POA: Diagnosis not present

## 2023-05-22 DIAGNOSIS — F331 Major depressive disorder, recurrent, moderate: Secondary | ICD-10-CM | POA: Diagnosis not present

## 2023-05-22 DIAGNOSIS — F411 Generalized anxiety disorder: Secondary | ICD-10-CM | POA: Diagnosis not present

## 2023-06-04 DIAGNOSIS — R1310 Dysphagia, unspecified: Secondary | ICD-10-CM | POA: Diagnosis not present

## 2023-06-04 DIAGNOSIS — K5900 Constipation, unspecified: Secondary | ICD-10-CM | POA: Diagnosis not present

## 2023-06-04 DIAGNOSIS — K219 Gastro-esophageal reflux disease without esophagitis: Secondary | ICD-10-CM | POA: Diagnosis not present

## 2023-06-12 ENCOUNTER — Ambulatory Visit (HOSPITAL_COMMUNITY)
Admission: RE | Admit: 2023-06-12 | Discharge: 2023-06-12 | Disposition: A | Discharge status: Home / Self Care | Attending: Otolaryngology | Admitting: Otolaryngology

## 2023-06-12 ENCOUNTER — Encounter (HOSPITAL_COMMUNITY)
Admission: RE | Disposition: A | Discharge status: Home / Self Care | Payer: Self-pay | Source: Home / Self Care | Attending: Otolaryngology

## 2023-06-12 ENCOUNTER — Ambulatory Visit (HOSPITAL_COMMUNITY)

## 2023-06-12 ENCOUNTER — Encounter (HOSPITAL_COMMUNITY): Payer: Self-pay | Admitting: Otolaryngology

## 2023-06-12 ENCOUNTER — Other Ambulatory Visit: Payer: Self-pay

## 2023-06-12 DIAGNOSIS — F418 Other specified anxiety disorders: Secondary | ICD-10-CM | POA: Diagnosis not present

## 2023-06-12 DIAGNOSIS — I10 Essential (primary) hypertension: Secondary | ICD-10-CM | POA: Diagnosis not present

## 2023-06-12 DIAGNOSIS — M797 Fibromyalgia: Secondary | ICD-10-CM | POA: Diagnosis not present

## 2023-06-12 DIAGNOSIS — F32A Depression, unspecified: Secondary | ICD-10-CM | POA: Diagnosis not present

## 2023-06-12 DIAGNOSIS — G4733 Obstructive sleep apnea (adult) (pediatric): Secondary | ICD-10-CM | POA: Diagnosis not present

## 2023-06-12 DIAGNOSIS — E039 Hypothyroidism, unspecified: Secondary | ICD-10-CM | POA: Insufficient documentation

## 2023-06-12 DIAGNOSIS — K219 Gastro-esophageal reflux disease without esophagitis: Secondary | ICD-10-CM | POA: Diagnosis not present

## 2023-06-12 DIAGNOSIS — F419 Anxiety disorder, unspecified: Secondary | ICD-10-CM | POA: Insufficient documentation

## 2023-06-12 DIAGNOSIS — J45909 Unspecified asthma, uncomplicated: Secondary | ICD-10-CM

## 2023-06-12 HISTORY — PX: DRUG INDUCED ENDOSCOPY: SHX6808

## 2023-06-12 SURGERY — DRUG INDUCED SLEEP ENDOSCOPY
Anesthesia: Monitor Anesthesia Care | Laterality: Bilateral

## 2023-06-12 MED ORDER — SODIUM CHLORIDE 0.9 % IV SOLN
INTRAVENOUS | Status: AC | PRN
  Administered 2023-06-12: 250 mL via INTRAVENOUS

## 2023-06-12 MED ORDER — PROPOFOL 500 MG/50ML IV EMUL
INTRAVENOUS | Status: DC | PRN
  Administered 2023-06-12: 125 ug/kg/min via INTRAVENOUS

## 2023-06-12 MED ORDER — OXYMETAZOLINE HCL 0.05 % NA SOLN
NASAL | Status: AC
  Filled 2023-06-12: qty 30

## 2023-06-12 NOTE — H&P (Signed)
 Kathy [name] is an 68 y.o. female.   Chief Complaint: Sleep apnea HPI: 68 year old female with sleep apnea who has not been able to tolerate CPAP.  Past Medical History:  Diagnosis Date   Anxiety    Aortic atherosclerosis (HCC)    Arthritis    Asthma    Depression    Dyspnea    with exertion   Fibromyalgia    GERD (gastroesophageal reflux disease)    Hypertension    Hypothyroidism    Pneumonia    Pre-diabetes    Sleep apnea    not in current use due to mask does not fit    Past Surgical History:  Procedure Laterality Date   ABDOMINAL HYSTERECTOMY     ANTERIOR CERVICAL DECOMP/DISCECTOMY FUSION     x 2   CERVICAL DISC SURGERY     x 3   CHOLECYSTECTOMY     COLONOSCOPY     KNEE ARTHROSCOPY     right   KNEE SURGERY     right knee surgery x 3   NASAL SINUS SURGERY     POSTERIOR CERVICAL FUSION/FORAMINOTOMY  02/17/2012   Procedure: POSTERIOR CERVICAL FUSION/FORAMINOTOMY LEVEL 2;  Surgeon: Corrina Dimitri, MD;  Location: [facility];  Service: Neurosurgery;  Laterality: N/A;  Cervical five to seven posterior arthrodesis   POSTERIOR CERVICAL FUSION/FORAMINOTOMY N/A 03/25/2012   Procedure: POSTERIOR CERVICAL FUSION/FORAMINOTOMY LEVEL 1;  Surgeon: Corrina Dimitri, MD;  Location: [facility];  Service: Neurosurgery;  Laterality: N/A;  Cervical five-six posterior cervical arthrodesis and instrumentation    RADIOLOGY WITH ANESTHESIA N/A 08/27/2018   Procedure: MRI LUMBAR SPINE WITHOUT CONTRAST;  Surgeon: Radiologist, Medication, MD;  Location: [facility] OR;  Service: Radiology;  Laterality: N/A;   RADIOLOGY WITH ANESTHESIA Right 10/09/2021   Procedure: MRI WITH ANESTHESIA;  Surgeon: Radiologist, Medication, MD;  Location: [facility] OR;  Service: Radiology;  Laterality: Right;   RADIOLOGY WITH ANESTHESIA N/A 11/27/2021   Procedure: LUMBER SPINE WITHOUT CONTRAST;  Surgeon: Radiologist, Medication, MD;  Location: [facility] OR;  Service: Radiology;  Laterality: N/A;   right wrist surgery       SHOULDER DEBRIDEMENT     5- Right, 3 left   SHOULDER SURGERY     5 on right and 3 on left   SINOSCOPY     TOTAL KNEE ARTHROPLASTY Right 09/10/2022   Procedure: TOTAL KNEE ARTHROPLASTY;  Surgeon: Saundra Curl, MD;  Location: [facility];  Service: Orthopedics;  Laterality: Right;   TRANSFORAMINAL LUMBAR INTERBODY FUSION W/ MIS 1 LEVEL Left 07/12/2019   Procedure: Left Lumbar Five- Sacral One minimally invasive transforaminal lumbar interbody fusion;  Surgeon: Cannon Champion, MD;  Location: [facility] OR;  Service: Neurosurgery;  Laterality: Left;  Left Lumbar Five- Sacral One minimally invasive transforaminal lumbar interbody fusion   TUBAL LIGATION      Family History  Problem Relation Age of Onset   Heart disease Mother 39   Diabetes Mother    Heart disease Father 79   Diabetes Sister    Colon cancer Maternal Aunt    Heart disease Paternal Uncle    Heart disease Maternal Grandfather    Heart disease Paternal Grandmother    Heart disease Paternal Grandfather    High blood pressure Brother    High blood pressure Brother    Heart disease Brother 50   Stroke Brother    Diabetes Brother    Breast cancer Neg Hx    Social History:  reports that she has  never smoked. She has never used smokeless tobacco. She reports current alcohol use. She reports that she does not use drugs.  Allergies:  Allergies  Allergen Reactions   Gabapentin  Swelling    From 06/21/2013 OV: Pt thinks it affects her speech some "more thick tongued".   Amlodipine Swelling   Clonazepam Other (See Comments)    syncope   Losartan Other (See Comments)    unknown   Mobic [Meloxicam] Itching   Naproxen Sodium Itching   Lisinopril Cough    Medications Prior to Admission  Medication Sig Dispense Refill   albuterol  (VENTOLIN  HFA) 108 (90 Base) MCG/ACT inhaler Inhale 1-2 puffs into the lungs every 6 (six) hours as needed for wheezing or shortness of breath.     budesonide-formoterol (SYMBICORT) 160-4.5 MCG/ACT inhaler  Inhale 2 puffs into the lungs 2 (two) times daily.     celecoxib  (CELEBREX ) 200 MG capsule Take 1 capsule (200 mg total) by mouth 2 (two) times daily as needed for mild pain (and swelling). 60 capsule 1   estradiol  (ESTRACE ) 0.5 MG tablet Take 0.5 mg by mouth daily.      famotidine (PEPCID) 40 MG tablet Take 40 mg by mouth 2 (two) times daily as needed for heartburn.     FLUoxetine  (PROZAC ) 20 MG capsule Take 20 mg by mouth daily. (Patient taking differently: Take 60 mg by mouth daily.)     fluticasone  (FLONASE ) 50 MCG/ACT nasal spray Place 2 sprays into both nostrils 2 (two) times daily.     hydrochlorothiazide  (HYDRODIURIL ) 12.5 MG tablet Take 12.5 mg by mouth daily.     levothyroxine  (SYNTHROID ) 50 MCG tablet Take 50 mcg by mouth daily before breakfast.     montelukast  (SINGULAIR ) 10 MG tablet Take 10 mg by mouth daily.     pramipexole (MIRAPEX) 1.5 MG tablet Take 1.5 mg by mouth 3 (three) times daily.     acetaminophen  (TYLENOL ) 500 MG tablet Take 2 tablets (1,000 mg total) by mouth every 6 (six) hours as needed for mild pain or moderate pain. 60 tablet 0   aspirin  EC 81 MG tablet Take 1 tablet (81 mg total) by mouth 2 (two) times daily. To prevent blood clots for 30 days after surgery. (Patient not taking: Reported on 06/12/2023) 60 tablet 0   diclofenac Sodium (VOLTAREN) 1 % GEL Apply 2 g topically daily as needed (pain). (Patient not taking: Reported on 06/12/2023)     FLUoxetine  (PROZAC ) 40 MG capsule Take 40 mg by mouth every morning.     methocarbamol  (ROBAXIN -750) 750 MG tablet Take 1 tablet (750 mg total) by mouth every 8 (eight) hours as needed for muscle spasms. DO NOT TAKE FLEXERIL  OR TIZANIDINE WHEN USING THIS MEDICINE! 20 tablet 0   metoprolol  tartrate (LOPRESSOR ) 100 MG tablet Take 1 tablet (100 mg total) by mouth once for 1 dose. Please take this medication 2 hours before CT (Patient not taking: Reported on 08/23/2022) 1 tablet 0   ondansetron  (ZOFRAN -ODT) 4 MG disintegrating tablet  Take 1 tablet (4 mg total) by mouth every 8 (eight) hours as needed for nausea or vomiting. 15 tablet 0   oxyCODONE  (ROXICODONE ) 5 MG immediate release tablet Take 1 tablet (5 mg total) by mouth every 4 (four) hours as needed for severe pain. (Patient not taking: Reported on 06/12/2023) 30 tablet 0   sennosides-docusate sodium  (SENOKOT-S) 8.6-50 MG tablet Take 2 tablets by mouth daily as needed for constipation (while taking narcotics). 30 tablet 1    No results found  for this or any previous visit (from the past 48 hours). No results found.  Review of Systems  All other systems reviewed and are negative.   Blood pressure (!) 140/79, pulse 68, temperature (!) 97.3 F (36.3 C), temperature source Temporal, resp. rate (!) 23, height 5\' 3"  (1.6 m), weight 76.7 kg, SpO2 97%. Physical Exam Constitutional:      Appearance: Normal appearance. She is normal weight.  HENT:     Head: Normocephalic and atraumatic.     Right Ear: External ear normal.     Left Ear: External ear normal.     Nose: Nose normal.     Mouth/Throat:     Mouth: Mucous membranes are moist.     Pharynx: Oropharynx is clear.  Eyes:     Extraocular Movements: Extraocular movements intact.     Pupils: Pupils are equal, round, and reactive to light.  Cardiovascular:     Rate and Rhythm: Normal rate.  Pulmonary:     Effort: Pulmonary effort is normal.  Musculoskeletal:     Cervical back: Normal range of motion.  Skin:    General: Skin is warm and dry.  Neurological:     General: No focal deficit present.     Mental Status: She is alert and oriented to person, place, and time.  Psychiatric:        Mood and Affect: Mood normal.        Behavior: Behavior normal.        Thought Content: Thought content normal.        Judgment: Judgment normal.      Assessment/Plan Obstructive sleep apnea and BMI 29.94  To OR for sleep endoscopy.  Virgina Grills, MD 06/12/2023, 12:30 PM

## 2023-06-12 NOTE — Anesthesia Preprocedure Evaluation (Addendum)
 Anesthesia Evaluation  Patient identified by MRN, date of birth, ID band Patient awake    Reviewed: Allergy & Precautions, H&P , NPO status , Patient's Chart, lab work & pertinent test results  Airway Mallampati: II  TM Distance: >3 FB Neck ROM: Full    Dental no notable dental hx.    Pulmonary asthma , sleep apnea    Pulmonary exam normal breath sounds clear to auscultation       Cardiovascular hypertension, Normal cardiovascular exam Rhythm:Regular Rate:Normal     Neuro/Psych  PSYCHIATRIC DISORDERS Anxiety Depression     Neuromuscular disease    GI/Hepatic ,GERD  ,,(+) Hepatitis -NASH   Endo/Other  Hypothyroidism    Renal/GU negative Renal ROS  negative genitourinary   Musculoskeletal  (+) Arthritis ,  Fibromyalgia -  Abdominal   Peds negative pediatric ROS (+)  Hematology negative hematology ROS (+)   Anesthesia Other Findings   Reproductive/Obstetrics negative OB ROS                             Anesthesia Physical Anesthesia Plan  ASA: 3  Anesthesia Plan: MAC   Post-op Pain Management:    Induction: Intravenous  PONV Risk Score and Plan: Propofol  infusion and Treatment may vary due to age or medical condition  Airway Management Planned: Natural Airway  Additional Equipment:   Intra-op Plan:   Post-operative Plan:   Informed Consent: I have reviewed the patients History and Physical, chart, labs and discussed the procedure including the risks, benefits and alternatives for the proposed anesthesia with the patient or authorized representative who has indicated his/her understanding and acceptance.     Dental advisory given  Plan Discussed with: CRNA  Anesthesia Plan Comments:         Anesthesia Quick Evaluation

## 2023-06-12 NOTE — Op Note (Addendum)
 Preop diagnosis: Obstructive sleep apnea Postop diagnosis: same Procedure: Drug-induced sleep endoscopy Surgeon: Tellis Feathers Anesth: IV sedation Compl: None Findings: There is 50% anterior-posterior collapse at the velum making her a candidate for hypoglossal nerve stimulator placement.  There was also some anterior-posterior collapse at the tongue base.  Of note, she writhed throughout the procedure. Description:  After discussing risks, benefits, and alternatives, the patient was brought to the operative suite and placed on the operative table in the supine position.  Anesthesia was induced and the patient was given light sedation to simulate natural sleep. When the proper level was reached, an Afrin-soaked pledget was placed in the right nasal passage for a couple of minutes and then removed.  The fiberoptic laryngoscope was then passed to view the pharynx and larynx.  Findings are noted above and the exam was recorded.  After completion, the scope was removed and the patient was returned to anesthesia for wakeup and was moved to the recovery room in stable condition.

## 2023-06-12 NOTE — Transfer of Care (Signed)
 Immediate Anesthesia Transfer of Care Note  Patient: Kathy [name]  Procedure(s) Performed: DRUG INDUCED SLEEP ENDOSCOPY (Bilateral)  Patient Location: Endoscopy Unit  Anesthesia Type:MAC  Level of Consciousness: drowsy  Airway & Oxygen Therapy: Patient Spontanous Breathing  Post-op Assessment: Report given to RN and Post -op Vital signs reviewed and stable  Post vital signs: Reviewed and stable  Last Vitals:  Vitals Value Taken Time  BP    Temp    Pulse 82 06/12/23 1303  Resp 21 06/12/23 1303  SpO2 94 % 06/12/23 1303  Vitals shown include unfiled device data.  Last Pain:  Vitals:   06/12/23 1053  TempSrc: Temporal  PainSc: 6       Patients Stated Pain Goal: 0 (06/12/23 1053)  Complications: No notable events documented.

## 2023-06-13 NOTE — Anesthesia Postprocedure Evaluation (Signed)
 Anesthesia Post Note  Patient: Kathy [name]  Procedure(s) Performed: DRUG INDUCED SLEEP ENDOSCOPY (Bilateral)     Anesthesia Type: MAC Anesthetic complications: no   No notable events documented.  Last Vitals:  Vitals:   06/12/23 1320 06/12/23 1325  BP: (!) 148/59 (!) 140/50  Pulse: 68 67  Resp: (!) 24 (!) 22  Temp:    SpO2: 99% 100%    Last Pain:  Vitals:   06/12/23 1310  TempSrc:   PainSc: 0-No pain                 Lethaniel Rave

## 2023-06-15 ENCOUNTER — Encounter (HOSPITAL_COMMUNITY): Payer: Self-pay | Admitting: Otolaryngology

## 2023-07-01 DIAGNOSIS — Z136 Encounter for screening for cardiovascular disorders: Secondary | ICD-10-CM | POA: Diagnosis not present

## 2023-07-01 DIAGNOSIS — Z0000 Encounter for general adult medical examination without abnormal findings: Secondary | ICD-10-CM | POA: Diagnosis not present

## 2023-07-01 DIAGNOSIS — F331 Major depressive disorder, recurrent, moderate: Secondary | ICD-10-CM | POA: Diagnosis not present

## 2023-07-01 DIAGNOSIS — G2581 Restless legs syndrome: Secondary | ICD-10-CM | POA: Diagnosis not present

## 2023-07-01 DIAGNOSIS — E039 Hypothyroidism, unspecified: Secondary | ICD-10-CM | POA: Diagnosis not present

## 2023-07-01 DIAGNOSIS — E669 Obesity, unspecified: Secondary | ICD-10-CM | POA: Diagnosis not present

## 2023-07-01 DIAGNOSIS — N3946 Mixed incontinence: Secondary | ICD-10-CM | POA: Diagnosis not present

## 2023-07-01 DIAGNOSIS — Z139 Encounter for screening, unspecified: Secondary | ICD-10-CM | POA: Diagnosis not present

## 2023-07-01 DIAGNOSIS — Z6830 Body mass index (BMI) 30.0-30.9, adult: Secondary | ICD-10-CM | POA: Diagnosis not present

## 2023-07-01 DIAGNOSIS — Z1339 Encounter for screening examination for other mental health and behavioral disorders: Secondary | ICD-10-CM | POA: Diagnosis not present

## 2023-07-01 DIAGNOSIS — Z1231 Encounter for screening mammogram for malignant neoplasm of breast: Secondary | ICD-10-CM | POA: Diagnosis not present

## 2023-07-01 DIAGNOSIS — Z1331 Encounter for screening for depression: Secondary | ICD-10-CM | POA: Diagnosis not present

## 2023-07-22 DIAGNOSIS — E2839 Other primary ovarian failure: Secondary | ICD-10-CM | POA: Diagnosis not present

## 2023-07-22 DIAGNOSIS — Z20822 Contact with and (suspected) exposure to covid-19: Secondary | ICD-10-CM | POA: Diagnosis not present

## 2023-07-22 DIAGNOSIS — J020 Streptococcal pharyngitis: Secondary | ICD-10-CM | POA: Diagnosis not present

## 2023-07-22 DIAGNOSIS — Z6830 Body mass index (BMI) 30.0-30.9, adult: Secondary | ICD-10-CM | POA: Diagnosis not present

## 2023-07-22 DIAGNOSIS — J029 Acute pharyngitis, unspecified: Secondary | ICD-10-CM | POA: Diagnosis not present

## 2023-07-23 ENCOUNTER — Other Ambulatory Visit (HOSPITAL_BASED_OUTPATIENT_CLINIC_OR_DEPARTMENT_OTHER): Payer: Self-pay | Admitting: Physician Assistant

## 2023-07-23 DIAGNOSIS — E2839 Other primary ovarian failure: Secondary | ICD-10-CM

## 2023-08-04 ENCOUNTER — Other Ambulatory Visit (HOSPITAL_BASED_OUTPATIENT_CLINIC_OR_DEPARTMENT_OTHER): Admitting: Radiology

## 2023-08-05 DIAGNOSIS — Z6829 Body mass index (BMI) 29.0-29.9, adult: Secondary | ICD-10-CM | POA: Diagnosis not present

## 2023-08-05 DIAGNOSIS — Z20822 Contact with and (suspected) exposure to covid-19: Secondary | ICD-10-CM | POA: Diagnosis not present

## 2023-08-05 DIAGNOSIS — R058 Other specified cough: Secondary | ICD-10-CM | POA: Diagnosis not present

## 2023-08-05 DIAGNOSIS — R059 Cough, unspecified: Secondary | ICD-10-CM | POA: Diagnosis not present

## 2023-08-07 ENCOUNTER — Ambulatory Visit (HOSPITAL_BASED_OUTPATIENT_CLINIC_OR_DEPARTMENT_OTHER)
Admission: RE | Admit: 2023-08-07 | Discharge: 2023-08-07 | Disposition: A | Discharge status: Home / Self Care | Source: Ambulatory Visit | Attending: Physician Assistant | Admitting: Physician Assistant

## 2023-08-07 DIAGNOSIS — Z780 Asymptomatic menopausal state: Secondary | ICD-10-CM | POA: Diagnosis not present

## 2023-08-07 DIAGNOSIS — E2839 Other primary ovarian failure: Secondary | ICD-10-CM

## 2023-08-07 DIAGNOSIS — M810 Age-related osteoporosis without current pathological fracture: Secondary | ICD-10-CM | POA: Diagnosis not present

## 2023-08-12 DIAGNOSIS — R053 Chronic cough: Secondary | ICD-10-CM | POA: Diagnosis not present

## 2023-08-12 DIAGNOSIS — M810 Age-related osteoporosis without current pathological fracture: Secondary | ICD-10-CM | POA: Diagnosis not present

## 2023-08-12 DIAGNOSIS — K219 Gastro-esophageal reflux disease without esophagitis: Secondary | ICD-10-CM | POA: Diagnosis not present

## 2023-08-12 DIAGNOSIS — Z6830 Body mass index (BMI) 30.0-30.9, adult: Secondary | ICD-10-CM | POA: Diagnosis not present

## 2023-08-13 DIAGNOSIS — J101 Influenza due to other identified influenza virus with other respiratory manifestations: Secondary | ICD-10-CM | POA: Diagnosis not present

## 2023-08-13 DIAGNOSIS — J4530 Mild persistent asthma, uncomplicated: Secondary | ICD-10-CM | POA: Diagnosis not present

## 2023-08-13 DIAGNOSIS — G4733 Obstructive sleep apnea (adult) (pediatric): Secondary | ICD-10-CM | POA: Diagnosis not present

## 2023-08-13 DIAGNOSIS — J479 Bronchiectasis, uncomplicated: Secondary | ICD-10-CM | POA: Diagnosis not present

## 2023-08-27 DIAGNOSIS — J479 Bronchiectasis, uncomplicated: Secondary | ICD-10-CM | POA: Diagnosis not present

## 2023-08-27 DIAGNOSIS — J4530 Mild persistent asthma, uncomplicated: Secondary | ICD-10-CM | POA: Diagnosis not present

## 2023-08-27 DIAGNOSIS — G4733 Obstructive sleep apnea (adult) (pediatric): Secondary | ICD-10-CM | POA: Diagnosis not present

## 2023-08-27 DIAGNOSIS — G2581 Restless legs syndrome: Secondary | ICD-10-CM | POA: Diagnosis not present

## 2023-09-05 DIAGNOSIS — R0600 Dyspnea, unspecified: Secondary | ICD-10-CM | POA: Diagnosis not present

## 2023-09-10 ENCOUNTER — Other Ambulatory Visit: Payer: Self-pay | Admitting: Otolaryngology

## 2023-09-12 NOTE — Telephone Encounter (Signed)
 Spoke to Kathy [name] ON 07/08/2023. INSPIRE IMPLANT surgery scheduled on 09/23/2023 at [facility]. PRE-OP APPT is on 09/16/2023 at 11:00AM. The post op appt is scheduled for 09/30/2023 at 11:15AM.  [facility] & EMAIL the information for surgery.

## 2023-09-16 DIAGNOSIS — J479 Bronchiectasis, uncomplicated: Secondary | ICD-10-CM | POA: Diagnosis not present

## 2023-09-16 DIAGNOSIS — G2581 Restless legs syndrome: Secondary | ICD-10-CM | POA: Diagnosis not present

## 2023-09-16 DIAGNOSIS — J4530 Mild persistent asthma, uncomplicated: Secondary | ICD-10-CM | POA: Diagnosis not present

## 2023-09-16 DIAGNOSIS — G4733 Obstructive sleep apnea (adult) (pediatric): Secondary | ICD-10-CM | POA: Diagnosis not present

## 2023-09-17 ENCOUNTER — Encounter (HOSPITAL_BASED_OUTPATIENT_CLINIC_OR_DEPARTMENT_OTHER): Payer: Self-pay | Admitting: Otolaryngology

## 2023-09-17 ENCOUNTER — Other Ambulatory Visit: Payer: Self-pay

## 2023-09-18 ENCOUNTER — Encounter (HOSPITAL_BASED_OUTPATIENT_CLINIC_OR_DEPARTMENT_OTHER)
Admission: RE | Admit: 2023-09-18 | Discharge: 2023-09-18 | Disposition: A | Discharge status: Home / Self Care | Source: Ambulatory Visit | Attending: Otolaryngology | Admitting: Otolaryngology

## 2023-09-18 DIAGNOSIS — K7581 Nonalcoholic steatohepatitis (NASH): Secondary | ICD-10-CM | POA: Diagnosis not present

## 2023-09-18 DIAGNOSIS — Z79890 Hormone replacement therapy (postmenopausal): Secondary | ICD-10-CM | POA: Diagnosis not present

## 2023-09-18 DIAGNOSIS — Z79899 Other long term (current) drug therapy: Secondary | ICD-10-CM | POA: Diagnosis not present

## 2023-09-18 DIAGNOSIS — F32A Depression, unspecified: Secondary | ICD-10-CM | POA: Diagnosis not present

## 2023-09-18 DIAGNOSIS — F419 Anxiety disorder, unspecified: Secondary | ICD-10-CM | POA: Diagnosis not present

## 2023-09-18 DIAGNOSIS — E039 Hypothyroidism, unspecified: Secondary | ICD-10-CM | POA: Diagnosis not present

## 2023-09-18 DIAGNOSIS — I10 Essential (primary) hypertension: Secondary | ICD-10-CM | POA: Diagnosis not present

## 2023-09-18 DIAGNOSIS — M797 Fibromyalgia: Secondary | ICD-10-CM | POA: Diagnosis not present

## 2023-09-18 DIAGNOSIS — E66811 Obesity, class 1: Secondary | ICD-10-CM | POA: Diagnosis not present

## 2023-09-18 DIAGNOSIS — K219 Gastro-esophageal reflux disease without esophagitis: Secondary | ICD-10-CM | POA: Diagnosis not present

## 2023-09-18 DIAGNOSIS — G4733 Obstructive sleep apnea (adult) (pediatric): Secondary | ICD-10-CM | POA: Diagnosis not present

## 2023-09-18 DIAGNOSIS — Z7951 Long term (current) use of inhaled steroids: Secondary | ICD-10-CM | POA: Diagnosis not present

## 2023-09-18 DIAGNOSIS — Z6830 Body mass index (BMI) 30.0-30.9, adult: Secondary | ICD-10-CM | POA: Diagnosis not present

## 2023-09-18 DIAGNOSIS — J45909 Unspecified asthma, uncomplicated: Secondary | ICD-10-CM | POA: Diagnosis not present

## 2023-09-18 DIAGNOSIS — M1990 Unspecified osteoarthritis, unspecified site: Secondary | ICD-10-CM | POA: Diagnosis not present

## 2023-09-18 LAB — BASIC METABOLIC PANEL WITH GFR
Anion gap: 9 (ref 5–15)
BUN: 13 mg/dL (ref 8–23)
CO2: 25 mmol/L (ref 22–32)
Calcium: 9.2 mg/dL (ref 8.9–10.3)
Chloride: 105 mmol/L (ref 98–111)
Creatinine, Ser: 0.87 mg/dL (ref 0.44–1.00)
GFR, Estimated: >60 mL/min (ref >60)
Glucose, Bld: 94 mg/dL (ref 70–99)
Potassium: 4.1 mmol/L (ref 3.5–5.1)
Sodium: 139 mmol/L (ref 135–145)

## 2023-09-18 NOTE — Progress Notes (Signed)

## 2023-09-23 ENCOUNTER — Encounter (HOSPITAL_BASED_OUTPATIENT_CLINIC_OR_DEPARTMENT_OTHER)
Admission: RE | Disposition: A | Discharge status: Home / Self Care | Payer: Self-pay | Source: Home / Self Care | Attending: Otolaryngology

## 2023-09-23 ENCOUNTER — Encounter (HOSPITAL_BASED_OUTPATIENT_CLINIC_OR_DEPARTMENT_OTHER): Payer: Self-pay | Admitting: Otolaryngology

## 2023-09-23 ENCOUNTER — Ambulatory Visit (HOSPITAL_BASED_OUTPATIENT_CLINIC_OR_DEPARTMENT_OTHER): Admitting: Anesthesiology

## 2023-09-23 ENCOUNTER — Other Ambulatory Visit: Payer: Self-pay

## 2023-09-23 ENCOUNTER — Ambulatory Visit (HOSPITAL_BASED_OUTPATIENT_CLINIC_OR_DEPARTMENT_OTHER)
Admission: RE | Admit: 2023-09-23 | Discharge: 2023-09-23 | Disposition: A | Discharge status: Home / Self Care | Attending: Otolaryngology | Admitting: Otolaryngology

## 2023-09-23 ENCOUNTER — Ambulatory Visit (HOSPITAL_COMMUNITY)

## 2023-09-23 DIAGNOSIS — K7581 Nonalcoholic steatohepatitis (NASH): Secondary | ICD-10-CM | POA: Insufficient documentation

## 2023-09-23 DIAGNOSIS — Z6831 Body mass index (BMI) 31.0-31.9, adult: Secondary | ICD-10-CM | POA: Diagnosis not present

## 2023-09-23 DIAGNOSIS — Z79890 Hormone replacement therapy (postmenopausal): Secondary | ICD-10-CM | POA: Diagnosis not present

## 2023-09-23 DIAGNOSIS — E66811 Obesity, class 1: Secondary | ICD-10-CM | POA: Diagnosis not present

## 2023-09-23 DIAGNOSIS — I10 Essential (primary) hypertension: Secondary | ICD-10-CM | POA: Insufficient documentation

## 2023-09-23 DIAGNOSIS — J45909 Unspecified asthma, uncomplicated: Secondary | ICD-10-CM | POA: Insufficient documentation

## 2023-09-23 DIAGNOSIS — Z79899 Other long term (current) drug therapy: Secondary | ICD-10-CM | POA: Insufficient documentation

## 2023-09-23 DIAGNOSIS — F419 Anxiety disorder, unspecified: Secondary | ICD-10-CM | POA: Diagnosis not present

## 2023-09-23 DIAGNOSIS — G4733 Obstructive sleep apnea (adult) (pediatric): Secondary | ICD-10-CM | POA: Diagnosis not present

## 2023-09-23 DIAGNOSIS — Z7951 Long term (current) use of inhaled steroids: Secondary | ICD-10-CM | POA: Insufficient documentation

## 2023-09-23 DIAGNOSIS — M47812 Spondylosis without myelopathy or radiculopathy, cervical region: Secondary | ICD-10-CM | POA: Diagnosis not present

## 2023-09-23 DIAGNOSIS — K219 Gastro-esophageal reflux disease without esophagitis: Secondary | ICD-10-CM | POA: Insufficient documentation

## 2023-09-23 DIAGNOSIS — M7989 Other specified soft tissue disorders: Secondary | ICD-10-CM | POA: Diagnosis not present

## 2023-09-23 DIAGNOSIS — E039 Hypothyroidism, unspecified: Secondary | ICD-10-CM | POA: Insufficient documentation

## 2023-09-23 DIAGNOSIS — Z6830 Body mass index (BMI) 30.0-30.9, adult: Secondary | ICD-10-CM | POA: Diagnosis not present

## 2023-09-23 DIAGNOSIS — M1990 Unspecified osteoarthritis, unspecified site: Secondary | ICD-10-CM | POA: Insufficient documentation

## 2023-09-23 DIAGNOSIS — F32A Depression, unspecified: Secondary | ICD-10-CM | POA: Insufficient documentation

## 2023-09-23 DIAGNOSIS — M797 Fibromyalgia: Secondary | ICD-10-CM | POA: Diagnosis not present

## 2023-09-23 DIAGNOSIS — R0989 Other specified symptoms and signs involving the circulatory and respiratory systems: Secondary | ICD-10-CM | POA: Diagnosis not present

## 2023-09-23 DIAGNOSIS — I517 Cardiomegaly: Secondary | ICD-10-CM | POA: Diagnosis not present

## 2023-09-23 HISTORY — DX: Restless legs syndrome: G25.81

## 2023-09-23 HISTORY — PX: IMPLANTATION OF HYPOGLOSSAL NERVE STIMULATOR: SHX6827

## 2023-09-23 SURGERY — INSERTION, HYPOGLOSSAL NERVE STIMULATOR
Anesthesia: General | Laterality: Bilateral

## 2023-09-23 MED ORDER — DEXAMETHASONE SODIUM PHOSPHATE 4 MG/ML IJ SOLN
INTRAMUSCULAR | Status: DC | PRN
  Administered 2023-09-23: 10 mg via INTRAVENOUS

## 2023-09-23 MED ORDER — CEFAZOLIN SODIUM-DEXTROSE 2-4 GM/100ML-% IV SOLN
INTRAVENOUS | Status: AC
  Filled 2023-09-23: qty 100

## 2023-09-23 MED ORDER — FENTANYL CITRATE (PF) 100 MCG/2ML IJ SOLN
INTRAMUSCULAR | Status: DC | PRN
  Administered 2023-09-23 (×4): 50 ug via INTRAVENOUS

## 2023-09-23 MED ORDER — MIDAZOLAM HCL 2 MG/2ML IJ SOLN
INTRAMUSCULAR | Status: AC
  Filled 2023-09-23: qty 2

## 2023-09-23 MED ORDER — PROPOFOL 10 MG/ML IV BOLUS
INTRAVENOUS | Status: AC
  Filled 2023-09-23: qty 20

## 2023-09-23 MED ORDER — ONDANSETRON HCL 4 MG/2ML IJ SOLN
INTRAMUSCULAR | Status: DC | PRN
  Administered 2023-09-23: 4 mg via INTRAVENOUS

## 2023-09-23 MED ORDER — ONDANSETRON HCL 4 MG/2ML IJ SOLN
INTRAMUSCULAR | Status: AC
  Filled 2023-09-23: qty 2

## 2023-09-23 MED ORDER — FENTANYL CITRATE (PF) 100 MCG/2ML IJ SOLN
25.0000 ug | INTRAMUSCULAR | Status: DC | PRN
  Administered 2023-09-23: 25 ug via INTRAVENOUS

## 2023-09-23 MED ORDER — OXYCODONE HCL 5 MG/5ML PO SOLN: 5.0000 mg | Freq: Once | ORAL | Status: AC | PRN

## 2023-09-23 MED ORDER — PHENYLEPHRINE HCL-NACL 20-0.9 MG/250ML-% IV SOLN
INTRAVENOUS | Status: DC | PRN
  Administered 2023-09-23: 25 ug/min via INTRAVENOUS

## 2023-09-23 MED ORDER — SUCCINYLCHOLINE CHLORIDE 200 MG/10ML IV SOSY
PREFILLED_SYRINGE | INTRAVENOUS | Status: DC | PRN
  Administered 2023-09-23: 60 mg via INTRAVENOUS

## 2023-09-23 MED ORDER — PROPOFOL 10 MG/ML IV BOLUS
INTRAVENOUS | Status: DC | PRN
  Administered 2023-09-23: 50 mg via INTRAVENOUS
  Administered 2023-09-23: 100 mg via INTRAVENOUS
  Administered 2023-09-23 (×3): 50 mg via INTRAVENOUS

## 2023-09-23 MED ORDER — LIDOCAINE HCL (CARDIAC) PF 100 MG/5ML IV SOSY
PREFILLED_SYRINGE | INTRAVENOUS | Status: DC | PRN
  Administered 2023-09-23: 80 mg via INTRAVENOUS

## 2023-09-23 MED ORDER — LIDOCAINE-EPINEPHRINE 1 %-1:100000 IJ SOLN
INTRAMUSCULAR | Status: DC | PRN
Start: 2023-09-23 — End: 2023-09-23
  Administered 2023-09-23: 5 mL

## 2023-09-23 MED ORDER — MIDAZOLAM HCL 5 MG/5ML IJ SOLN
INTRAMUSCULAR | Status: DC | PRN
  Administered 2023-09-23: 2 mg via INTRAVENOUS

## 2023-09-23 MED ORDER — OXYCODONE HCL 5 MG PO TABS
5.0000 mg | ORAL_TABLET | Freq: Once | ORAL | Status: AC | PRN
  Administered 2023-09-23: 5 mg via ORAL

## 2023-09-23 MED ORDER — DEXAMETHASONE SODIUM PHOSPHATE 10 MG/ML IJ SOLN
INTRAMUSCULAR | Status: AC
  Filled 2023-09-23: qty 1

## 2023-09-23 MED ORDER — CEFAZOLIN SODIUM-DEXTROSE 2-4 GM/100ML-% IV SOLN
2.0000 g | INTRAVENOUS | Status: AC
  Administered 2023-09-23: 2 g via INTRAVENOUS

## 2023-09-23 MED ORDER — SUCCINYLCHOLINE CHLORIDE 200 MG/10ML IV SOSY
PREFILLED_SYRINGE | INTRAVENOUS | Status: AC
  Filled 2023-09-23: qty 10

## 2023-09-23 MED ORDER — OXYCODONE HCL 5 MG PO TABS
ORAL_TABLET | ORAL | Status: AC
  Filled 2023-09-23: qty 1

## 2023-09-23 MED ORDER — PROPOFOL 500 MG/50ML IV EMUL
INTRAVENOUS | Status: DC | PRN
Start: 2023-09-23 — End: 2023-09-23
  Administered 2023-09-23: 50 ug/kg/min via INTRAVENOUS

## 2023-09-23 MED ORDER — PHENYLEPHRINE HCL (PRESSORS) 10 MG/ML IV SOLN
INTRAVENOUS | Status: DC | PRN
  Administered 2023-09-23 (×2): 80 ug via INTRAVENOUS

## 2023-09-23 MED ORDER — EPHEDRINE SULFATE (PRESSORS) 50 MG/ML IJ SOLN
INTRAMUSCULAR | Status: DC | PRN
  Administered 2023-09-23: 10 mg via INTRAVENOUS

## 2023-09-23 MED ORDER — LACTATED RINGERS IV SOLN: INTRAVENOUS | Status: DC

## 2023-09-23 MED ORDER — DROPERIDOL 2.5 MG/ML IJ SOLN: 0.6250 mg | Freq: Once | INTRAMUSCULAR | Status: DC | PRN

## 2023-09-23 MED ORDER — 0.9 % SODIUM CHLORIDE (POUR BTL) OPTIME
TOPICAL | Status: DC | PRN
  Administered 2023-09-23: 120 mL

## 2023-09-23 MED ORDER — HYDROCODONE-ACETAMINOPHEN 5-325 MG PO TABS: 1.0000 | ORAL_TABLET | Freq: Four times a day (QID) | ORAL | 0 refills | Status: DC | PRN

## 2023-09-23 MED ORDER — FENTANYL CITRATE (PF) 100 MCG/2ML IJ SOLN
INTRAMUSCULAR | Status: AC
  Filled 2023-09-23: qty 2

## 2023-09-23 SURGICAL SUPPLY — 59 items
BLADE CLIPPER SURG (BLADE) IMPLANT
BLADE SURG 15 STRL LF DISP TIS (BLADE) ×1 IMPLANT
CANISTER SUCT 1200ML W/VALVE (MISCELLANEOUS) ×1 IMPLANT
CORD BIPOLAR FORCEPS 12FT (ELECTRODE) ×1 IMPLANT
COVER PROBE CYLINDRICAL 5X96 (MISCELLANEOUS) ×1 IMPLANT
DERMABOND ADVANCED .7 DNX12 (GAUZE/BANDAGES/DRESSINGS) ×2 IMPLANT
DRAPE C-ARM 35X43 STRL (DRAPES) ×1 IMPLANT
DRAPE HEAD BAR (DRAPES) IMPLANT
DRAPE INCISE IOBAN 66X45 STRL (DRAPES) ×1 IMPLANT
DRAPE MICROSCOPE WILD 40.5X102 (DRAPES) ×1 IMPLANT
DRAPE UTILITY XL STRL (DRAPES) ×1 IMPLANT
DRSG TEGADERM 2-3/8X2-3/4 SM (GAUZE/BANDAGES/DRESSINGS) ×2 IMPLANT
DRSG TEGADERM 4X4.75 (GAUZE/BANDAGES/DRESSINGS) IMPLANT
ELECT COATED BLADE 2.86 ST (ELECTRODE) ×1 IMPLANT
ELECTRODE EMG 18 NIMS (NEUROSURGERY SUPPLIES) ×1 IMPLANT
ELECTRODE REM PT RTRN 9FT ADLT (ELECTROSURGICAL) ×1 IMPLANT
FORCEPS BIPOLAR SPETZLER 8 1.0 (NEUROSURGERY SUPPLIES) ×1 IMPLANT
GAUZE 4X4 16PLY ~~LOC~~+RFID DBL (SPONGE) ×1 IMPLANT
GAUZE SPONGE 4X4 12PLY STRL (GAUZE/BANDAGES/DRESSINGS) ×1 IMPLANT
GENERATOR PULSE INSPIRE (Generator) ×1 IMPLANT
GENERATOR PULSE INSPIRE IV (Generator) ×1 IMPLANT
GLOVE BIO SURGEON STRL SZ 6.5 (GLOVE) IMPLANT
GLOVE BIO SURGEON STRL SZ7.5 (GLOVE) ×1 IMPLANT
GLOVE BIOGEL PI IND STRL 7.0 (GLOVE) IMPLANT
GLOVE BIOGEL PI IND STRL 7.5 (GLOVE) IMPLANT
GLOVE SURG SS PI 7.0 STRL IVOR (GLOVE) IMPLANT
GOWN STRL REUS W/ TWL LRG LVL3 (GOWN DISPOSABLE) ×3 IMPLANT
IV CATH 18G SAFETY (IV SOLUTION) ×1 IMPLANT
KIT NEURO ACCESSORY W/WRENCH (MISCELLANEOUS) IMPLANT
LEAD SENSING RESP INSPIRE (Lead) ×1 IMPLANT
LEAD SENSING RESP INSPIRE IV (Lead) ×1 IMPLANT
LEAD SLEEP STIM INSPIRE IV/V (Lead) ×1 IMPLANT
LEAD SLEEP STIMULATION INSPIRE (Lead) ×1 IMPLANT
LOOP VASCLR MAXI BLUE 18IN ST (MISCELLANEOUS) ×1 IMPLANT
LOOP VESSEL MINI RED (MISCELLANEOUS) ×1 IMPLANT
LOOPS VASCLR MAXI BLUE 18IN ST (MISCELLANEOUS) ×1 IMPLANT
MARKER SKIN DUAL TIP RULER LAB (MISCELLANEOUS) ×1 IMPLANT
NDL HYPO 25X1 1.5 SAFETY (NEEDLE) ×1 IMPLANT
NEEDLE HYPO 25X1 1.5 SAFETY (NEEDLE) ×1 IMPLANT
NS IRRIG 1000ML POUR BTL (IV SOLUTION) ×1 IMPLANT
PACK BASIN DAY SURGERY FS (CUSTOM PROCEDURE TRAY) ×1 IMPLANT
PACK ENT DAY SURGERY (CUSTOM PROCEDURE TRAY) ×1 IMPLANT
PASSER CATH 36 CODMAN DISP (NEUROSURGERY SUPPLIES) IMPLANT
PASSER CATH 38CM DISP (INSTRUMENTS) IMPLANT
PENCIL SMOKE EVACUATOR (MISCELLANEOUS) ×1 IMPLANT
PROBE NERVE STIMULATOR (NEUROSURGERY SUPPLIES) ×1 IMPLANT
REMOTE CONTROL SLEEP INSPIRE (MISCELLANEOUS) ×1 IMPLANT
SET WALTER ACTIVATION W/DRAPE (SET/KITS/TRAYS/PACK) ×1 IMPLANT
SLEEVE SCD COMPRESS KNEE MED (STOCKING) ×1 IMPLANT
SPONGE INTESTINAL PEANUT (DISPOSABLE) ×1 IMPLANT
SUT SILK 2 0 SH (SUTURE) ×1 IMPLANT
SUT SILK 3 0 REEL (SUTURE) ×1 IMPLANT
SUT SILK 3 0 SH 30 (SUTURE) ×1 IMPLANT
SUT VIC AB 3-0 SH 27X BRD (SUTURE) ×2 IMPLANT
SUT VIC AB 4-0 PS2 27 (SUTURE) ×2 IMPLANT
SUTURE SILK 3-0 RB1 30XBRD (SUTURE) ×1 IMPLANT
SYR 10ML LL (SYRINGE) ×1 IMPLANT
SYR BULB EAR ULCER 3OZ GRN STR (SYRINGE) ×1 IMPLANT
TOWEL GREEN STERILE FF (TOWEL DISPOSABLE) ×2 IMPLANT

## 2023-09-23 NOTE — H&P (Signed)
 Kathy [name] is an 68 y.o. female.   Chief Complaint: Sleep apnea HPI: 68 year old female with obstructive sleep apnea who has been unable to tolerate CPAP therapy.  Past Medical History:  Diagnosis Date   Anxiety    Aortic atherosclerosis (HCC)    Arthritis    Asthma    Depression    Dyspnea    with exertion   Fibromyalgia    GERD (gastroesophageal reflux disease)    Hypertension    Hypothyroidism    Pneumonia    Pre-diabetes    Restless leg    Sleep apnea    does not use CPAP    Past Surgical History:  Procedure Laterality Date   ABDOMINAL HYSTERECTOMY     ANTERIOR CERVICAL DECOMP/DISCECTOMY FUSION     x 2   CERVICAL DISC SURGERY     x 3   CHOLECYSTECTOMY     COLONOSCOPY     DRUG INDUCED ENDOSCOPY Bilateral 06/12/2023   Procedure: DRUG INDUCED SLEEP ENDOSCOPY;  Surgeon: Carlie Clark, MD;  Location: [facility] ENDOSCOPY;  Service: ENT;  Laterality: Bilateral;   KNEE ARTHROSCOPY     right   KNEE SURGERY     right knee surgery x 3   NASAL SINUS SURGERY     POSTERIOR CERVICAL FUSION/FORAMINOTOMY  02/17/2012   Procedure: POSTERIOR CERVICAL FUSION/FORAMINOTOMY LEVEL 2;  Surgeon: Lamar LELON Peaches, MD;  Location: [facility] NEURO ORS;  Service: Neurosurgery;  Laterality: N/A;  Cervical five to seven posterior arthrodesis   POSTERIOR CERVICAL FUSION/FORAMINOTOMY N/A 03/25/2012   Procedure: POSTERIOR CERVICAL FUSION/FORAMINOTOMY LEVEL 1;  Surgeon: Lamar LELON Peaches, MD;  Location: [facility];  Service: Neurosurgery;  Laterality: N/A;  Cervical five-six posterior cervical arthrodesis and instrumentation    RADIOLOGY WITH ANESTHESIA N/A 08/27/2018   Procedure: MRI LUMBAR SPINE WITHOUT CONTRAST;  Surgeon: Radiologist, Medication, MD;  Location: [facility] OR;  Service: Radiology;  Laterality: N/A;   RADIOLOGY WITH ANESTHESIA Right 10/09/2021   Procedure: MRI WITH ANESTHESIA;  Surgeon: Radiologist, Medication, MD;  Location: [facility] OR;  Service: Radiology;  Laterality: Right;   RADIOLOGY WITH  ANESTHESIA N/A 11/27/2021   Procedure: LUMBER SPINE WITHOUT CONTRAST;  Surgeon: Radiologist, Medication, MD;  Location: [facility] OR;  Service: Radiology;  Laterality: N/A;   right wrist surgery      SHOULDER DEBRIDEMENT     5- Right, 3 left   SHOULDER SURGERY     5 on right and 3 on left   SINOSCOPY     TOTAL KNEE ARTHROPLASTY Right 09/10/2022   Procedure: TOTAL KNEE ARTHROPLASTY;  Surgeon: Beverley Evalene BIRCH, MD;  Location: [facility] ORS;  Service: Orthopedics;  Laterality: Right;   TRANSFORAMINAL LUMBAR INTERBODY FUSION W/ MIS 1 LEVEL Left 07/12/2019   Procedure: Left Lumbar Five- Sacral One minimally invasive transforaminal lumbar interbody fusion;  Surgeon: Cheryle Debby LABOR, MD;  Location: [facility] OR;  Service: Neurosurgery;  Laterality: Left;  Left Lumbar Five- Sacral One minimally invasive transforaminal lumbar interbody fusion   TUBAL LIGATION      Family History  Problem Relation Age of Onset   Heart disease Mother 12   Diabetes Mother    Heart disease Father 38   Diabetes Sister    Colon cancer Maternal Aunt    Heart disease Paternal Uncle    Heart disease Maternal Grandfather    Heart disease Paternal Grandmother    Heart disease Paternal Grandfather    High blood pressure Brother    High blood pressure Brother    Heart  disease Brother 49   Stroke Brother    Diabetes Brother    Breast cancer Neg Hx    Social History:  reports that she has never smoked. She has never used smokeless tobacco. She reports current alcohol  use. She reports that she does not use drugs.  Allergies:  Allergies  Allergen Reactions   Gabapentin  Swelling    From 06/21/2013 OV: Pt thinks it affects her speech some more thick tongued.   Amlodipine Swelling   Clonazepam Other (See Comments)    syncope   Losartan Other (See Comments)    unknown   Mobic [Meloxicam] Itching   Naproxen Sodium Itching   Lisinopril Cough    Medications Prior to Admission  Medication Sig Dispense Refill   albuterol  (2.5  MG/3ML) 0.083% NEBU 3 mL, albuterol  (5 MG/ML) 0.5% NEBU 0.5 mL Inhale into the lungs.     albuterol  (VENTOLIN  HFA) 108 (90 Base) MCG/ACT inhaler Inhale 1-2 puffs into the lungs every 6 (six) hours as needed for wheezing or shortness of breath.     budesonide-formoterol (SYMBICORT) 160-4.5 MCG/ACT inhaler Inhale 2 puffs into the lungs 2 (two) times daily.     celecoxib  (CELEBREX ) 200 MG capsule Take 1 capsule (200 mg total) by mouth 2 (two) times daily as needed for mild pain (and swelling). 60 capsule 1   estradiol  (ESTRACE ) 0.5 MG tablet Take 0.5 mg by mouth daily.      famotidine (PEPCID) 40 MG tablet Take 40 mg by mouth 2 (two) times daily as needed for heartburn.     FLUoxetine  (PROZAC ) 20 MG capsule Take 20 mg by mouth daily. (Patient taking differently: Take 60 mg by mouth daily.)     fluticasone  (FLONASE ) 50 MCG/ACT nasal spray Place 2 sprays into both nostrils 2 (two) times daily.     hydrochlorothiazide  (HYDRODIURIL ) 12.5 MG tablet Take 12.5 mg by mouth daily.     levothyroxine  (SYNTHROID ) 50 MCG tablet Take 50 mcg by mouth daily before breakfast.     methocarbamol  (ROBAXIN -750) 750 MG tablet Take 1 tablet (750 mg total) by mouth every 8 (eight) hours as needed for muscle spasms. DO NOT TAKE FLEXERIL  OR TIZANIDINE WHEN USING THIS MEDICINE! 20 tablet 0   montelukast  (SINGULAIR ) 10 MG tablet Take 10 mg by mouth daily.     rOPINIRole  (REQUIP ) 2 MG tablet Take 2 mg by mouth at bedtime.     acetaminophen  (TYLENOL ) 500 MG tablet Take 2 tablets (1,000 mg total) by mouth every 6 (six) hours as needed for mild pain or moderate pain. 60 tablet 0   ondansetron  (ZOFRAN -ODT) 4 MG disintegrating tablet Take 1 tablet (4 mg total) by mouth every 8 (eight) hours as needed for nausea or vomiting. 15 tablet 0    No results found for this or any previous visit (from the past 48 hours). No results found.  Review of Systems  All other systems reviewed and are negative.   Blood pressure (!) 135/90, pulse  63, temperature (!) 97.1 F (36.2 C), temperature source Temporal, resp. rate 18, height 5' 3 (1.6 m), weight 78.4 kg, SpO2 99%. Physical Exam Constitutional:      Appearance: Normal appearance. She is normal weight.  HENT:     Head: Normocephalic and atraumatic.     Right Ear: External ear normal.     Left Ear: External ear normal.     Nose: Nose normal.     Mouth/Throat:     Mouth: Mucous membranes are moist.  Pharynx: Oropharynx is clear.  Eyes:     Extraocular Movements: Extraocular movements intact.     Pupils: Pupils are equal, round, and reactive to light.  Cardiovascular:     Rate and Rhythm: Normal rate.  Pulmonary:     Effort: Pulmonary effort is normal.  Skin:    General: Skin is warm and dry.  Neurological:     General: No focal deficit present.     Mental Status: She is alert and oriented to person, place, and time.  Psychiatric:        Mood and Affect: Mood normal.        Behavior: Behavior normal.        Thought Content: Thought content normal.        Judgment: Judgment normal.      Assessment/Plan Obstructive sleep apnea and BMI 30.62  To OR for hypoglossal nerve stimulator placement.  Vaughan Ricker, MD 09/23/2023, 10:57 AM

## 2023-09-23 NOTE — Anesthesia Procedure Notes (Signed)
 Procedure Name: Intubation Date/Time: 09/23/2023 11:47 AM  Performed by: Julieanne Fairy BROCKS, CRNAPre-anesthesia Checklist: Patient identified, Emergency Drugs available, Suction available and Patient being monitored Patient Re-evaluated:Patient Re-evaluated prior to induction Oxygen Delivery Method: Circle system utilized Preoxygenation: Pre-oxygenation with 100% oxygen Induction Type: IV induction Ventilation: Mask ventilation without difficulty Laryngoscope Size: Mac and 3 Grade View: Grade I Tube type: Oral Tube size: 7.0 mm Number of attempts: 1 Airway Equipment and Method: Stylet and Oral airway Placement Confirmation: ETT inserted through vocal cords under direct vision, positive ETCO2 and breath sounds checked- equal and bilateral Secured at: 22 cm Tube secured with: Tape Dental Injury: Teeth and Oropharynx as per pre-operative assessment

## 2023-09-23 NOTE — Discharge Instructions (Addendum)

## 2023-09-23 NOTE — Anesthesia Preprocedure Evaluation (Addendum)
 Anesthesia Evaluation  Patient identified by MRN, date of birth, ID band Patient awake    Reviewed: Allergy & Precautions, H&P , NPO status , Patient's Chart, lab work & pertinent test results  Airway Mallampati: III  TM Distance: >3 FB Neck ROM: Limited    Dental  (+) Dental Advisory Given, Teeth Intact   Pulmonary asthma , sleep apnea    breath sounds clear to auscultation       Cardiovascular hypertension, Pt. on medications  Rhythm:Regular Rate:Normal     Neuro/Psych  PSYCHIATRIC DISORDERS Anxiety Depression     Neuromuscular disease    GI/Hepatic ,GERD  ,,(+) Hepatitis -NASH   Endo/Other  Hypothyroidism    Renal/GU negative Renal ROS     Musculoskeletal  (+) Arthritis ,  Fibromyalgia -  Abdominal   Peds  Hematology negative hematology ROS (+)   Anesthesia Other Findings   Reproductive/Obstetrics                              Anesthesia Physical Anesthesia Plan  ASA: 3  Anesthesia Plan: General   Post-op Pain Management: Tylenol  PO (pre-op)*   Induction: Intravenous  PONV Risk Score and Plan: 3 and Treatment may vary due to age or medical condition, Ondansetron  and Dexamethasone   Airway Management Planned: Oral ETT  Additional Equipment:   Intra-op Plan:   Post-operative Plan: Extubation in OR  Informed Consent: I have reviewed the patients History and Physical, chart, labs and discussed the procedure including the risks, benefits and alternatives for the proposed anesthesia with the patient or authorized representative who has indicated his/her understanding and acceptance.     Dental advisory given  Plan Discussed with: CRNA  Anesthesia Plan Comments:          Anesthesia Quick Evaluation

## 2023-09-23 NOTE — Transfer of Care (Signed)
 Immediate Anesthesia Transfer of Care Note  Patient: Kathy [name]  Procedure(s) Performed: INSERTION, HYPOGLOSSAL NERVE STIMULATOR (Bilateral)  Patient Location: PACU  Anesthesia Type:General  Level of Consciousness: awake, alert , and oriented  Airway & Oxygen Therapy: Patient Spontanous Breathing and Patient connected to nasal cannula oxygen  Post-op Assessment: Report given to RN and Post -op Vital signs reviewed and stable  Post vital signs: Reviewed and stable  Last Vitals:  Vitals Value Taken Time  BP 145/66 09/23/23 13:30  Temp 36.6 C 09/23/23 13:30  Pulse 75 09/23/23 13:32  Resp 20 09/23/23 13:32  SpO2 98 % 09/23/23 13:32  Vitals shown include unfiled device data.  Last Pain:  Vitals:   09/23/23 1330  TempSrc:   PainSc: 0-No pain         Complications: No notable events documented.

## 2023-09-23 NOTE — Anesthesia Postprocedure Evaluation (Signed)
 Anesthesia Post Note  Patient: Kathy [name]  Procedure(s) Performed: INSERTION, HYPOGLOSSAL NERVE STIMULATOR (Bilateral)     Patient location during evaluation: PACU Anesthesia Type: General Level of consciousness: awake and alert Pain management: pain level controlled Vital Signs Assessment: post-procedure vital signs reviewed and stable Respiratory status: spontaneous breathing Cardiovascular status: stable Anesthetic complications: no   No notable events documented.  Last Vitals:  Vitals:   09/23/23 1415 09/23/23 1459  BP: (!) 157/73 (!) 175/66  Pulse: 70 73  Resp: (!) 24 17  Temp:  36.4 C  SpO2: 93% 96%    Last Pain:  Vitals:   09/23/23 1439  TempSrc:   PainSc: 6                  Norleen Pope

## 2023-09-23 NOTE — Op Note (Signed)

## 2023-09-24 ENCOUNTER — Encounter (HOSPITAL_BASED_OUTPATIENT_CLINIC_OR_DEPARTMENT_OTHER): Payer: Self-pay | Admitting: Otolaryngology

## 2023-09-30 DIAGNOSIS — G4733 Obstructive sleep apnea (adult) (pediatric): Secondary | ICD-10-CM | POA: Diagnosis not present

## 2023-10-15 ENCOUNTER — Other Ambulatory Visit: Payer: Self-pay

## 2023-10-15 DIAGNOSIS — I10 Essential (primary) hypertension: Secondary | ICD-10-CM | POA: Insufficient documentation

## 2023-10-15 DIAGNOSIS — J45909 Unspecified asthma, uncomplicated: Secondary | ICD-10-CM | POA: Insufficient documentation

## 2023-10-15 DIAGNOSIS — E039 Hypothyroidism, unspecified: Secondary | ICD-10-CM | POA: Insufficient documentation

## 2023-10-15 DIAGNOSIS — F32A Depression, unspecified: Secondary | ICD-10-CM | POA: Insufficient documentation

## 2023-10-15 DIAGNOSIS — M1990 Unspecified osteoarthritis, unspecified site: Secondary | ICD-10-CM | POA: Insufficient documentation

## 2023-10-15 DIAGNOSIS — J189 Pneumonia, unspecified organism: Secondary | ICD-10-CM | POA: Insufficient documentation

## 2023-10-15 DIAGNOSIS — R0600 Dyspnea, unspecified: Secondary | ICD-10-CM | POA: Insufficient documentation

## 2023-10-15 DIAGNOSIS — G4730 Sleep apnea, unspecified: Secondary | ICD-10-CM | POA: Insufficient documentation

## 2023-10-16 ENCOUNTER — Encounter: Payer: Self-pay | Admitting: Cardiology

## 2023-10-16 ENCOUNTER — Ambulatory Visit: Attending: Cardiology | Admitting: Cardiology

## 2023-10-16 VITALS — BP 132/80 | HR 68 | Ht 63.0 in | Wt 173.8 lb

## 2023-10-16 DIAGNOSIS — I10 Essential (primary) hypertension: Secondary | ICD-10-CM | POA: Diagnosis not present

## 2023-10-16 DIAGNOSIS — G4733 Obstructive sleep apnea (adult) (pediatric): Secondary | ICD-10-CM

## 2023-10-16 DIAGNOSIS — J479 Bronchiectasis, uncomplicated: Secondary | ICD-10-CM | POA: Diagnosis not present

## 2023-10-16 DIAGNOSIS — I2510 Atherosclerotic heart disease of native coronary artery without angina pectoris: Secondary | ICD-10-CM | POA: Diagnosis not present

## 2023-10-16 DIAGNOSIS — R0609 Other forms of dyspnea: Secondary | ICD-10-CM

## 2023-10-16 NOTE — Addendum Note (Signed)
 Addended by: ARLOA PLANAS D on: 10/16/2023 02:09 PM   Modules accepted: Orders

## 2023-10-16 NOTE — Patient Instructions (Signed)

## 2023-10-16 NOTE — Progress Notes (Signed)
 Cardiology Office Note:    Date:  10/16/2023   ID:  Kathy [name], DOB [birth year], MRN [id number]  PCP:  Trinidad Hun, MD  Cardiologist:  Lamar Fitch, MD    Referring MD: Trinidad Hun, MD   No chief complaint on file.   History of Present Illness:    Kathy [name] is a 68 y.o. female past medical history significant for aortic atherosclerosis, coronary artery disease, coronary CT after last year showed minimal disease involving proximal LAD between 0 and 24%, obstructive sleep apnea status post recent inspire implantation, chronic lung condition with bronchiectasis.  She comes today to months for follow-up.  She complains still of having shortness of breath with exertion.  Pulmonologist told her she had no lung issues she would like to be evaluated make sure there is no cardiac problems going on.  Denies have any chest pain tightness squeezing pressure burning chest.  Complain of being weakness weak tired and fatigued.  Past Medical History:  Diagnosis Date   ACQUIRED ABSENCE OF BOTH CERVIX AND UTERUS 08/23/2008   Qualifier: Diagnosis of   By: Eben MD, Reyes         Anxiety    Aortic atherosclerosis [facility])    Arthritis    ARTHROSCOPY, HX OF 08/23/2008   Annotation: left shoulder and distal clavicle resection  Qualifier: Diagnosis of   By: Eben MD, Reyes         Asthma    Asthma, not well controlled, mild persistent, with acute exacerbation 04/18/2017   Atherosclerosis of both carotid arteries 01/01/2018   Seen on CT 12/2017     Atypical chest pain 06/18/2022   Atypical nevi 02/15/2020   Bilateral knee pain 03/26/2017   Bilateral shoulder pain 03/26/2017   Body mass index (BMI) 25.0-25.9, adult 09/01/2020   Body mass index (BMI) 31.0-31.9, adult 07/22/2019   Bronchiectasis without complication (HCC) 02/25/2019   Formatting of this note might be different from the original.  chronic cough.  Seen on CT 2019     Cervicogenic headache 11/25/2013   Chronic cough  10/08/2017   Chronic sinusitis 04/04/2015   Last Assessment & Plan:   Relevant Hx:  Course:  Daily Update:  Today's Plan:will refill singulair  today      Electronically signed by: [place] Dariel Sayres, FNP  04/05/15 1428     Compression fracture of thoracic vertebra (HCC) 01/12/2021   Current moderate episode of major depressive disorder without prior episode (HCC) 06/12/2020   Degeneration of lumbar intervertebral disc 04/05/2015   Depression    Dysphagia 04/12/2020   Dysphonia 10/08/2017   Dyspnea    with exertion   Dyspnea on exertion 11/22/2015   Edema 03/30/2018   Elevated blood-pressure reading, without diagnosis of hypertension 06/18/2019   Essential hypertension 05/20/2016   Fibromyalgia    GERD (gastroesophageal reflux disease)    Globus sensation 01/12/2021   History of arthrodesis 08/23/2008   Overview:   Overview:   Annotation: left shoulder and distal clavicle resection  Qualifier: Diagnosis of   By: Eben MD, Reyes      History of COVID-19 03/15/2020   History of lumbar fusion 09/01/2020   Hormone replacement therapy 10/28/2017   Hypertension    Hypothyroidism    Hypothyroidism (acquired) 07/02/2016   Inflammation of sacroiliac joint (HCC) 01/12/2021   Inflammatory dermatosis 04/18/2017   Iron deficiency 09/10/2019   Laryngopharyngeal reflux (LPR) 11/19/2017   Left upper quadrant abdominal pain 09/25/2020   Local infection of skin and subcutaneous tissue 08/06/2008  Qualifier: Diagnosis of   By: Eben MD, Reyes SCULL SNOMED Dx Update Oct 2024     Localized edema 05/20/2016   LPRD (laryngopharyngeal reflux disease) 04/18/2017   Lumbar facet arthropathy 05/24/2022   Lumbar radiculopathy 07/12/2019   Malaise and fatigue 04/04/2015   Mixed hyperlipidemia 04/04/2015   Moderate episode of recurrent major depressive disorder (HCC) 09/10/2019   Morbid obesity due to excess calories (HCC) 08/23/2020   NASH (nonalcoholic steatohepatitis) 04/22/2018    Formatting of this note might be different from the original.  Seen on [facility] 04/2018     Near syncope 11/22/2015   Osteoarthritis of multiple joints 04/04/2015   Osteopenia 04/04/2015   Other allergic rhinitis 04/18/2017   Other osteoporosis without current pathological fracture 12/17/2018   Formatting of this note might be different from the original.  Seen on DEXA 12/2018.  Discussing at next F/u. Recommend meds.     Pain in thoracic spine 10/20/2018   Pneumonia    Polyarthralgia 09/10/2019   Positive ANA (antinuclear antibody) 03/02/2019   Prediabetes 03/30/2018   Pulmonary nodule 09/09/2017   Recurrent major depressive disorder, in remission (HCC) 04/05/2015   Restless legs syndrome 04/05/2015   Sleep apnea    does not use CPAP   Strain of neck muscle 04/07/2013   Thyroid  nodule 01/01/2018   Seen on CT 12/2017. US  2016. Repeat US  02/2018 okay. Size mild larger, but cystic component. Repeat 2 yrs.     Trochanteric bursitis of right hip 07/18/2021   Urge incontinence of urine 12/13/2020   Vitamin B12 deficiency 04/05/2015   Vitamin D  deficiency disease 04/05/2015    Past Surgical History:  Procedure Laterality Date   ABDOMINAL HYSTERECTOMY     ANTERIOR CERVICAL DECOMP/DISCECTOMY FUSION     x 2   CERVICAL DISC SURGERY     x 3   CHOLECYSTECTOMY     COLONOSCOPY     DRUG INDUCED ENDOSCOPY Bilateral 06/12/2023   Procedure: DRUG INDUCED SLEEP ENDOSCOPY;  Surgeon: Carlie Clark, MD;  Location: [facility] ENDOSCOPY;  Service: ENT;  Laterality: Bilateral;   IMPLANTATION OF HYPOGLOSSAL NERVE STIMULATOR Bilateral 09/23/2023   Procedure: INSERTION, HYPOGLOSSAL NERVE STIMULATOR;  Surgeon: Carlie Clark, MD;  Location: [facility];  Service: ENT;  Laterality: Bilateral;   KNEE ARTHROSCOPY     right   KNEE SURGERY     right knee surgery x 3   NASAL SINUS SURGERY     POSTERIOR CERVICAL FUSION/FORAMINOTOMY  02/17/2012   Procedure: POSTERIOR CERVICAL FUSION/FORAMINOTOMY LEVEL 2;   Surgeon: Lamar LELON Peaches, MD;  Location: [facility] NEURO ORS;  Service: Neurosurgery;  Laterality: N/A;  Cervical five to seven posterior arthrodesis   POSTERIOR CERVICAL FUSION/FORAMINOTOMY N/A 03/25/2012   Procedure: POSTERIOR CERVICAL FUSION/FORAMINOTOMY LEVEL 1;  Surgeon: Lamar LELON Peaches, MD;  Location: [facility];  Service: Neurosurgery;  Laterality: N/A;  Cervical five-six posterior cervical arthrodesis and instrumentation    RADIOLOGY WITH ANESTHESIA N/A 08/27/2018   Procedure: MRI LUMBAR SPINE WITHOUT CONTRAST;  Surgeon: Radiologist, Medication, MD;  Location: [facility] OR;  Service: Radiology;  Laterality: N/A;   RADIOLOGY WITH ANESTHESIA Right 10/09/2021   Procedure: MRI WITH ANESTHESIA;  Surgeon: Radiologist, Medication, MD;  Location: [facility] OR;  Service: Radiology;  Laterality: Right;   RADIOLOGY WITH ANESTHESIA N/A 11/27/2021   Procedure: LUMBER SPINE WITHOUT CONTRAST;  Surgeon: Radiologist, Medication, MD;  Location: [facility] OR;  Service: Radiology;  Laterality: N/A;   right wrist surgery  SHOULDER DEBRIDEMENT     5- Right, 3 left   SHOULDER SURGERY     5 on right and 3 on left   SINOSCOPY     TOTAL KNEE ARTHROPLASTY Right 09/10/2022   Procedure: TOTAL KNEE ARTHROPLASTY;  Surgeon: Beverley Evalene BIRCH, MD;  Location: [facility] ORS;  Service: Orthopedics;  Laterality: Right;   TRANSFORAMINAL LUMBAR INTERBODY FUSION W/ MIS 1 LEVEL Left 07/12/2019   Procedure: Left Lumbar Five- Sacral One minimally invasive transforaminal lumbar interbody fusion;  Surgeon: Cheryle Debby LABOR, MD;  Location: [facility] OR;  Service: Neurosurgery;  Laterality: Left;  Left Lumbar Five- Sacral One minimally invasive transforaminal lumbar interbody fusion   TUBAL LIGATION      Current Medications: Current Meds  Medication Sig   acetaminophen  (TYLENOL ) 500 MG tablet Take 2 tablets (1,000 mg total) by mouth every 6 (six) hours as needed for mild pain or moderate pain.   albuterol  (2.5 MG/3ML) 0.083% NEBU 3 mL, albuterol  (5 MG/ML)  0.5% NEBU 0.5 mL Inhale into the lungs.   albuterol  (VENTOLIN  HFA) 108 (90 Base) MCG/ACT inhaler Inhale 1-2 puffs into the lungs every 6 (six) hours as needed for wheezing or shortness of breath.   budesonide-formoterol (SYMBICORT) 160-4.5 MCG/ACT inhaler Inhale 2 puffs into the lungs 2 (two) times daily.   busPIRone (BUSPAR) 5 MG tablet Take 5 mg by mouth daily.   carvedilol (COREG) 6.25 MG tablet Take 6.25 mg by mouth 2 (two) times daily with a meal.   celecoxib  (CELEBREX ) 200 MG capsule Take 1 capsule (200 mg total) by mouth 2 (two) times daily as needed for mild pain (and swelling).   estradiol  (ESTRACE ) 0.5 MG tablet Take 0.5 mg by mouth daily.    famotidine (PEPCID) 40 MG tablet Take 40 mg by mouth 2 (two) times daily as needed for heartburn.   FLUoxetine  (PROZAC ) 20 MG capsule Take 20 mg by mouth daily.   levothyroxine  (SYNTHROID ) 50 MCG tablet Take 50 mcg by mouth daily before breakfast.   rOPINIRole  (REQUIP ) 2 MG tablet Take 2 mg by mouth at bedtime.   tolterodine (DETROL) 2 MG tablet Take 2 mg by mouth 2 (two) times daily.     Allergies:   Gabapentin , Amlodipine, Clonazepam, Losartan, Mobic [meloxicam], Moperone, Naproxen sodium, Tramadol , and Lisinopril   Social History   Socioeconomic History   Marital status: Divorced    Spouse name: Not on file   Number of children: Not on file   Years of education: Not on file   Highest education level: Not on file  Occupational History   Not on file  Tobacco Use   Smoking status: Never   Smokeless tobacco: Never  Vaping Use   Vaping status: Never Used  Substance and Sexual Activity   Alcohol  use: Yes    Alcohol /week: 0.0 standard drinks of alcohol     Comment: rarely - , 1 per month   Drug use: No   Sexual activity: Not Currently    Birth control/protection: Surgical    Comment: hyst  Other Topics Concern   Not on file  Social History Narrative   Lives alone.  No children.     Right Handed    Lives in a two story home     Social Drivers of Health   Financial Resource Strain: Not on file  Food Insecurity: Not on file  Transportation Needs: No Transportation Needs (08/15/2021)   Received from [facility], [facility] visits prior to 04/06/2022.   PRAPARE -  Administrator, Civil Service (Medical): No    Lack of Transportation (Non-Medical): No  Physical Activity: Not on file  Stress: Not on file  Social Connections: Not on file     Family History: The patient's family history includes Colon cancer in her maternal aunt; Diabetes in her brother, mother, and sister; Heart disease in her maternal grandfather, paternal grandfather, paternal grandmother, and paternal uncle; Heart disease (age of onset: 65) in her brother; Heart disease (age of onset: 67) in her mother; Heart disease (age of onset: 45) in her father; High blood pressure in her brother and brother; Stroke in her brother. There is no history of Breast cancer. ROS:   Please see the history of present illness.    All 14 point review of systems negative except as described per history of present illness  EKGs/Labs/Other Studies Reviewed:         Recent Labs: 09/18/2023: BUN 13; Creatinine, Ser 0.87; Potassium 4.1; Sodium 139  Recent Lipid Panel No results found for: CHOL, TRIG, HDL, CHOLHDL, VLDL, LDLCALC, LDLDIRECT  Physical Exam:    VS:  BP 132/80   Pulse 68   Ht 5' 3 (1.6 m)   Wt 173 lb 12.8 oz (78.8 kg)   SpO2 97%   BMI 30.79 kg/m     Wt Readings from Last 3 Encounters:  10/16/23 173 lb 12.8 oz (78.8 kg)  09/23/23 172 lb 13.5 oz (78.4 kg)  06/12/23 169 lb (76.7 kg)     GEN:  Well nourished, well developed in no acute distress HEENT: Normal NECK: No JVD; No carotid bruits LYMPHATICS: No lymphadenopathy CARDIAC: RRR, no murmurs, no rubs, no gallops RESPIRATORY:  Clear to auscultation without rales, wheezing or rhonchi  ABDOMEN: Soft, non-tender, non-distended MUSCULOSKELETAL:   No edema; No deformity  SKIN: Warm and dry LOWER EXTREMITIES: no swelling NEUROLOGIC:  Alert and oriented x 3 PSYCHIATRIC:  Normal affect   ASSESSMENT:    1. Coronary artery disease involving native coronary artery of native heart without angina pectoris   2. Essential hypertension   3. Bronchiectasis without complication (HCC)   4. Obstructive sleep apnea syndrome    PLAN:    In order of problems listed above:  Coronary disease only minimal based on coronary CT angio done in May 2024.Does not have any symptoms that would indicate reactivation problem, or worsening of the condition.  Will continue present management. Dyspnea on exertion probably multifactorial will do echocardiogram to assess left ventricular systolic and diastolic function. Profound weakness and fatigue I suspect this could be related to obstructive sleep apnea which appears to be quite significant.  She does have inspire device implanted just a week ago so.  Supposedly next week device have to be turned on hopefully that will help her with her symptomatology.   Medication Adjustments/Labs and Tests Ordered: Current medicines are reviewed at length with the patient today.  Concerns regarding medicines are outlined above.  No orders of the defined types were placed in this encounter.  Medication changes: No orders of the defined types were placed in this encounter.   Signed, Lamar DOROTHA Fitch, MD, [facility] 10/16/2023 2:03 PM    [facility]

## 2023-10-20 DIAGNOSIS — R001 Bradycardia, unspecified: Secondary | ICD-10-CM | POA: Diagnosis not present

## 2023-10-20 DIAGNOSIS — S161XXA Strain of muscle, fascia and tendon at neck level, initial encounter: Secondary | ICD-10-CM | POA: Diagnosis not present

## 2023-10-20 DIAGNOSIS — I10 Essential (primary) hypertension: Secondary | ICD-10-CM | POA: Diagnosis not present

## 2023-10-20 DIAGNOSIS — S0990XA Unspecified injury of head, initial encounter: Secondary | ICD-10-CM | POA: Diagnosis not present

## 2023-10-20 DIAGNOSIS — R519 Headache, unspecified: Secondary | ICD-10-CM | POA: Diagnosis not present

## 2023-10-20 DIAGNOSIS — W010XXA Fall on same level from slipping, tripping and stumbling without subsequent striking against object, initial encounter: Secondary | ICD-10-CM | POA: Diagnosis not present

## 2023-10-20 DIAGNOSIS — W01198A Fall on same level from slipping, tripping and stumbling with subsequent striking against other object, initial encounter: Secondary | ICD-10-CM | POA: Diagnosis not present

## 2023-10-20 DIAGNOSIS — R6884 Jaw pain: Secondary | ICD-10-CM | POA: Diagnosis not present

## 2023-10-20 DIAGNOSIS — S0003XA Contusion of scalp, initial encounter: Secondary | ICD-10-CM | POA: Diagnosis not present

## 2023-10-21 ENCOUNTER — Ambulatory Visit: Attending: Cardiology

## 2023-10-21 DIAGNOSIS — R0609 Other forms of dyspnea: Secondary | ICD-10-CM | POA: Diagnosis not present

## 2023-10-21 LAB — ECHOCARDIOGRAM COMPLETE
AR max vel: 2.21 cm2
AV Area VTI: 2.39 cm2
AV Area mean vel: 2.17 cm2
AV Mean grad: 2 mmHg
AV Peak grad: 4.1 mmHg
Ao pk vel: 1.01 m/s
Area-P 1/2: 2.71 cm2
MV M vel: 3.41 m/s
MV Peak grad: 46.5 mmHg
MV VTI: 1.07 cm2
S' Lateral: 2.7 cm

## 2023-10-23 ENCOUNTER — Ambulatory Visit: Payer: Self-pay | Admitting: Cardiology

## 2023-10-23 ENCOUNTER — Ambulatory Visit: Admitting: Podiatry

## 2023-10-23 DIAGNOSIS — Z6830 Body mass index (BMI) 30.0-30.9, adult: Secondary | ICD-10-CM | POA: Diagnosis not present

## 2023-10-23 DIAGNOSIS — M810 Age-related osteoporosis without current pathological fracture: Secondary | ICD-10-CM | POA: Diagnosis not present

## 2023-10-23 DIAGNOSIS — Z9181 History of falling: Secondary | ICD-10-CM | POA: Diagnosis not present

## 2023-10-23 DIAGNOSIS — Z789 Other specified health status: Secondary | ICD-10-CM | POA: Diagnosis not present

## 2023-10-23 DIAGNOSIS — K219 Gastro-esophageal reflux disease without esophagitis: Secondary | ICD-10-CM | POA: Diagnosis not present

## 2023-10-27 ENCOUNTER — Ambulatory Visit (HOSPITAL_BASED_OUTPATIENT_CLINIC_OR_DEPARTMENT_OTHER): Admitting: Pulmonary Disease

## 2023-10-27 ENCOUNTER — Encounter (HOSPITAL_BASED_OUTPATIENT_CLINIC_OR_DEPARTMENT_OTHER): Payer: Self-pay | Admitting: Pulmonary Disease

## 2023-10-27 VITALS — BP 130/74 | HR 80 | Ht 63.0 in | Wt 170.1 lb

## 2023-10-27 DIAGNOSIS — G4733 Obstructive sleep apnea (adult) (pediatric): Secondary | ICD-10-CM | POA: Diagnosis not present

## 2023-10-27 DIAGNOSIS — J45909 Unspecified asthma, uncomplicated: Secondary | ICD-10-CM

## 2023-10-27 DIAGNOSIS — Z4542 Encounter for adjustment and management of neuropacemaker (brain) (peripheral nerve) (spinal cord): Secondary | ICD-10-CM

## 2023-10-27 DIAGNOSIS — Z9682 Presence of neurostimulator: Secondary | ICD-10-CM

## 2023-10-27 NOTE — Patient Instructions (Signed)
 Your device was activated today Start delay 8m Pause time 15 m Duration 9h  Level 1 = 0.4 V Increase by 1 level every week until tongue discomfort  Try to use every night

## 2023-10-27 NOTE — Progress Notes (Signed)
 Epworth Sleepiness Scale  Use the following scale to choose the most appropriate number for each situation. 0 Would never nod off 1  Slight  chance of nodding off 2 Moderate chance of nodding off 3 High chance of nodding off  Sitting and reading: 3 Watching TV: 1 Sitting, inactive, in a public place (e.g., in a meeting, theater, or dinner event): 1 As a passenger in a car for an hour or more without stopping for a break: 2 Lying down to rest when circumstances permit:1 Sitting and talking to someone: 0 Sitting quietly after a meal without alcohol : 0 In a car, while stopped for a few  minutes in traffic or at a light: 0  TOTOAL: 8

## 2023-10-27 NOTE — Progress Notes (Addendum)
 New Patient Pulmonology Office Visit   Subjective:  Patient ID: Kathy [name], female    DOB: [birth year]  MRN: [id number]  Referred by: Trinidad Hun, MD  CC:  Chief Complaint  Patient presents with   Sleep Apnea    Inspire    Discussed the use of AI scribe software for clinical note transcription with the patient, who gave verbal consent to proceed.  History of Present Illness Kathy [name] is a 68 year old female with obstructive sleep apnea who presents for device activation. She was referred by Dr. Sable for management of her obstructive sleep apnea.  She underwent hypoglossal nerve stimulator implantation on September 23, 2023, due to obstructive sleep apnea. CPAP therapy was intolerable due to discomfort and improper fit, worsened by her fused neck from previous surgery.  She has bronchial asthma, which developed post-COVID-19. She uses a nebulizer twice daily and a rescue inhaler as needed, though she occasionally forgets to use the nebulizer.  She experiences restless legs syndrome affecting her arms and legs, disrupting her sleep. Requip  is effective if taken on time. She has difficulty staying asleep, often waking every thirty minutes, and sometimes remains in bed until 10 AM if she has no obligations. She wakes up at night to use the bathroom and sometimes drinks water  due to dry mouth from snoring.   ESS 8 Bedtime 10-11 pm, sleep latency 30 mins, 6-7 awakenings, awake around 7 but stays in bedroom until 10 sometimes  Significant tests/ events reviewed  10/2021 [facility] >> AHI 26/h, low sat 83%  ROS  Constitutional: negative for anorexia, fevers and sweats  Eyes: negative for irritation, redness and visual disturbance  Ears, nose, mouth, throat, and face: negative for earaches, epistaxis, nasal congestion and sore throat  Respiratory: negative for cough, dyspnea on exertion, sputum and wheezing  Cardiovascular: negative for chest pain, dyspnea, lower extremity  edema, orthopnea, palpitations and syncope  Gastrointestinal: negative for abdominal pain, constipation, diarrhea, melena, nausea and vomiting  Genitourinary:negative for dysuria, frequency and hematuria  Hematologic/lymphatic: negative for bleeding, easy bruising and lymphadenopathy  Musculoskeletal:negative for arthralgias, muscle weakness and stiff joints  Neurological: negative for coordination problems, gait problems, headaches and weakness  Endocrine: negative for diabetic symptoms including polydipsia, polyuria and weight loss   Allergies: Gabapentin , Amlodipine, Clonazepam, Losartan, Mobic [meloxicam], Moperone, Naproxen sodium, Tramadol , and Lisinopril  Current Outpatient Medications:    acetaminophen  (TYLENOL ) 500 MG tablet, Take 2 tablets (1,000 mg total) by mouth every 6 (six) hours as needed for mild pain or moderate pain., Disp: 60 tablet, Rfl: 0   albuterol  (2.5 MG/3ML) 0.083% NEBU 3 mL, albuterol  (5 MG/ML) 0.5% NEBU 0.5 mL, Inhale into the lungs., Disp: , Rfl:    budesonide-formoterol (SYMBICORT) 160-4.5 MCG/ACT inhaler, Inhale 2 puffs into the lungs 2 (two) times daily., Disp: , Rfl:    busPIRone (BUSPAR) 5 MG tablet, Take 5 mg by mouth daily., Disp: , Rfl:    carvedilol (COREG) 6.25 MG tablet, Take 6.25 mg by mouth 2 (two) times daily with a meal., Disp: , Rfl:    celecoxib  (CELEBREX ) 200 MG capsule, Take 1 capsule (200 mg total) by mouth 2 (two) times daily as needed for mild pain (and swelling)., Disp: 60 capsule, Rfl: 1   estradiol  (ESTRACE ) 0.5 MG tablet, Take 0.5 mg by mouth daily. , Disp: , Rfl:    famotidine (PEPCID) 40 MG tablet, Take 40 mg by mouth 2 (two) times daily as needed for heartburn., Disp: , Rfl:  FLUoxetine  (PROZAC ) 20 MG capsule, Take 20 mg by mouth daily., Disp: , Rfl:    levothyroxine  (SYNTHROID ) 50 MCG tablet, Take 50 mcg by mouth daily before breakfast., Disp: , Rfl:    rOPINIRole  (REQUIP ) 2 MG tablet, Take 2 mg by mouth at bedtime., Disp: , Rfl:     tolterodine (DETROL) 2 MG tablet, Take 2 mg by mouth 2 (two) times daily., Disp: , Rfl:    albuterol  (VENTOLIN  HFA) 108 (90 Base) MCG/ACT inhaler, Inhale 1-2 puffs into the lungs every 6 (six) hours as needed for wheezing or shortness of breath. (Patient not taking: Reported on 10/27/2023), Disp: , Rfl:  Past Medical History:  Diagnosis Date   ACQUIRED ABSENCE OF BOTH CERVIX AND UTERUS 08/23/2008   Qualifier: Diagnosis of   By: Eben MD, Reyes         Anxiety    Aortic atherosclerosis [facility])    Arthritis    ARTHROSCOPY, HX OF 08/23/2008   Annotation: left shoulder and distal clavicle resection  Qualifier: Diagnosis of   By: Eben MD, Reyes         Asthma    Asthma, not well controlled, mild persistent, with acute exacerbation 04/18/2017   Atherosclerosis of both carotid arteries 01/01/2018   Seen on CT 12/2017     Atypical chest pain 06/18/2022   Atypical nevi 02/15/2020   Bilateral knee pain 03/26/2017   Bilateral shoulder pain 03/26/2017   Body mass index (BMI) 25.0-25.9, adult 09/01/2020   Body mass index (BMI) 31.0-31.9, adult 07/22/2019   Bronchiectasis without complication (HCC) 02/25/2019   Formatting of this note might be different from the original.  chronic cough.  Seen on CT 2019     Cervicogenic headache 11/25/2013   Chronic cough 10/08/2017   Chronic sinusitis 04/04/2015   Last Assessment & Plan:   Relevant Hx:  Course:  Daily Update:  Today's Plan:will refill singulair  today      Electronically signed by: [place] Dariel Sayres, FNP  04/05/15 1428     Compression fracture of thoracic vertebra (HCC) 01/12/2021   Current moderate episode of major depressive disorder without prior episode (HCC) 06/12/2020   Degeneration of lumbar intervertebral disc 04/05/2015   Depression    Dysphagia 04/12/2020   Dysphonia 10/08/2017   Dyspnea    with exertion   Dyspnea on exertion 11/22/2015   Edema 03/30/2018   Elevated blood-pressure reading, without diagnosis of  hypertension 06/18/2019   Essential hypertension 05/20/2016   Fibromyalgia    GERD (gastroesophageal reflux disease)    Globus sensation 01/12/2021   History of arthrodesis 08/23/2008   Overview:   Overview:   Annotation: left shoulder and distal clavicle resection  Qualifier: Diagnosis of   By: Eben MD, Reyes      History of COVID-19 03/15/2020   History of lumbar fusion 09/01/2020   Hormone replacement therapy 10/28/2017   Hypertension    Hypothyroidism    Hypothyroidism (acquired) 07/02/2016   Inflammation of sacroiliac joint (HCC) 01/12/2021   Inflammatory dermatosis 04/18/2017   Iron deficiency 09/10/2019   Laryngopharyngeal reflux (LPR) 11/19/2017   Left upper quadrant abdominal pain 09/25/2020   Local infection of skin and subcutaneous tissue 08/06/2008   Qualifier: Diagnosis of   By: Eben MD, Reyes SCULL SNOMED Dx Update Oct 2024     Localized edema 05/20/2016   LPRD (laryngopharyngeal reflux disease) 04/18/2017   Lumbar facet arthropathy 05/24/2022   Lumbar radiculopathy 07/12/2019   Malaise and fatigue 04/04/2015  Mixed hyperlipidemia 04/04/2015   Moderate episode of recurrent major depressive disorder (HCC) 09/10/2019   Morbid obesity due to excess calories (HCC) 08/23/2020   NASH (nonalcoholic steatohepatitis) 04/22/2018   Formatting of this note might be different from the original.  Seen on CT [facility] 04/2018     Near syncope 11/22/2015   Osteoarthritis of multiple joints 04/04/2015   Osteopenia 04/04/2015   Other allergic rhinitis 04/18/2017   Other osteoporosis without current pathological fracture 12/17/2018   Formatting of this note might be different from the original.  Seen on DEXA 12/2018.  Discussing at next F/u. Recommend meds.     Pain in thoracic spine 10/20/2018   Pneumonia    Polyarthralgia 09/10/2019   Positive ANA (antinuclear antibody) 03/02/2019   Prediabetes 03/30/2018   Pulmonary nodule 09/09/2017   Recurrent major depressive  disorder, in remission (HCC) 04/05/2015   Restless legs syndrome 04/05/2015   Sleep apnea    does not use CPAP   Strain of neck muscle 04/07/2013   Thyroid  nodule 01/01/2018   Seen on CT 12/2017. US  2016. Repeat US  02/2018 okay. Size mild larger, but cystic component. Repeat 2 yrs.     Trochanteric bursitis of right hip 07/18/2021   Urge incontinence of urine 12/13/2020   Vitamin B12 deficiency 04/05/2015   Vitamin D  deficiency disease 04/05/2015   Past Surgical History:  Procedure Laterality Date   ABDOMINAL HYSTERECTOMY     ANTERIOR CERVICAL DECOMP/DISCECTOMY FUSION     x 2   CERVICAL DISC SURGERY     x 3   CHOLECYSTECTOMY     COLONOSCOPY     DRUG INDUCED ENDOSCOPY Bilateral 06/12/2023   Procedure: DRUG INDUCED SLEEP ENDOSCOPY;  Surgeon: Carlie Clark, MD;  Location: [facility] ENDOSCOPY;  Service: ENT;  Laterality: Bilateral;   IMPLANTATION OF HYPOGLOSSAL NERVE STIMULATOR Bilateral 09/23/2023   Procedure: INSERTION, HYPOGLOSSAL NERVE STIMULATOR;  Surgeon: Carlie Clark, MD;  Location: [facility];  Service: ENT;  Laterality: Bilateral;   KNEE ARTHROSCOPY     right   KNEE SURGERY     right knee surgery x 3   NASAL SINUS SURGERY     POSTERIOR CERVICAL FUSION/FORAMINOTOMY  02/17/2012   Procedure: POSTERIOR CERVICAL FUSION/FORAMINOTOMY LEVEL 2;  Surgeon: Lamar LELON Peaches, MD;  Location: [facility]URO [facility]S;  Service: Neurosurgery;  Laterality: N/A;  Cervical five to seven posterior arthrodesis   POSTERIOR CERVICAL FUSION/FORAMINOTOMY N/A 03/25/2012   Procedure: POSTERIOR CERVICAL FUSION/FORAMINOTOMY LEVEL 1;  Surgeon: Lamar LELON Peaches, MD;  Location: [facility];  Service: Neurosurgery;  Laterality: N/A;  Cervical five-six posterior cervical arthrodesis and instrumentation    RADIOLOGY WITH ANESTHESIA N/A 08/27/2018   Procedure: MRI LUMBAR SPINE WITHOUT CONTRAST;  Surgeon: Radiologist, Medication, MD;  Location: [facility] OR;  Service: Radiology;  Laterality: N/A;   RADIOLOGY WITH  ANESTHESIA Right 10/09/2021   Procedure: MRI WITH ANESTHESIA;  Surgeon: Radiologist, Medication, MD;  Location: [facility] OR;  Service: Radiology;  Laterality: Right;   RADIOLOGY WITH ANESTHESIA N/A 11/27/2021   Procedure: LUMBER SPINE WITHOUT CONTRAST;  Surgeon: Radiologist, Medication, MD;  Location: [facility] OR;  Service: Radiology;  Laterality: N/A;   right wrist surgery      SHOULDER DEBRIDEMENT     5- Right, 3 left   SHOULDER SURGERY     5 on right and 3 on left   SINOSCOPY     TOTAL KNEE ARTHROPLASTY Right 09/10/2022   Procedure: TOTAL KNEE ARTHROPLASTY;  Surgeon: Beverley Evalene BIRCH, MD;  Location: [facility] ORS;  Service: Orthopedics;  Laterality: Right;   TRANSFORAMINAL LUMBAR INTERBODY FUSION W/ MIS 1 LEVEL Left 07/12/2019   Procedure: Left Lumbar Five- Sacral One minimally invasive transforaminal lumbar interbody fusion;  Surgeon: Cheryle Debby LABOR, MD;  Location: [facility] OR;  Service: Neurosurgery;  Laterality: Left;  Left Lumbar Five- Sacral One minimally invasive transforaminal lumbar interbody fusion   TUBAL LIGATION     Family History  Problem Relation Age of Onset   Heart disease Mother 61   Diabetes Mother    Heart disease Father 60   Diabetes Sister    Colon cancer Maternal Aunt    Heart disease Paternal Uncle    Heart disease Maternal Grandfather    Heart disease Paternal Grandmother    Heart disease Paternal Grandfather    High blood pressure Brother    High blood pressure Brother    Heart disease Brother 50   Stroke Brother    Diabetes Brother    Breast cancer Neg Hx    Social History   Socioeconomic History   Marital status: Divorced    Spouse name: Not on file   Number of children: Not on file   Years of education: Not on file   Highest education level: Not on file  Occupational History   Not on file  Tobacco Use   Smoking status: Never   Smokeless tobacco: Never  Vaping Use   Vaping status: Never Used  Substance and Sexual Activity   Alcohol  use: Yes    Alcohol /week:  0.0 standard drinks of alcohol     Comment: rarely - , 1 per month   Drug use: No   Sexual activity: Not Currently    Birth control/protection: Surgical    Comment: hyst  Other Topics Concern   Not on file  Social History Narrative   Lives alone.  No children.     Right Handed    Lives in a two story home    Social Drivers of Health   Financial Resource Strain: Not on file  Food Insecurity: Not on file  Transportation Needs: No Transportation Needs (08/15/2021)   Received from [facility] visits prior to 04/06/2022.   PRAPARE - Administrator, Civil Service (Medical): No    Lack of Transportation (Non-Medical): No  Physical Activity: Not on file  Stress: Not on file  Social Connections: Not on file  Intimate Partner Violence: Not on file       Objective:  BP 130/74   Pulse 80   Ht 5' 3 (1.6 m)   Wt 170 lb 1.6 oz (77.2 kg)   SpO2 97%   BMI 30.13 kg/m    Physical Exam  Gen. Pleasant, obese, in no distress ENT - no lesions, no post nasal drip Neck: No JVD, no thyromegaly, no carotid bruits Lungs: no use of accessory muscles, no dullness to percussion, decreased without rales or rhonchi  Cardiovascular: Rhythm regular, heart sounds  normal, no murmurs or gallops, no peripheral edema Musculoskeletal: No deformities, no cyanosis or clubbing , no tremors   SKin - scars healed      Assessment & Plan:  Assessment and Plan Assessment & Plan Obstructive sleep apnea status post hypoglossal nerve stimulator implantation Obstructive sleep apnea managed with hypoglossal nerve stimulator implantation on September 23, 2023. Device appears to be healing well. She previously had difficulty with CPAP due to facial fit issues and discomfort from neck fusion. Current visit is for activation of the hypoglossal nerve  stimulator. She is informed about the process and is willing to be patient for the adjustment period. - Hypoglossal nerve  stimulator was activated today.  Incision was checked, tongue protrusion was examined Programming : The activation workflow was followed 1.  Stimulation level : Sensation 0.3 V functional level 0.4 V lower limit, upper limit 1.4 V 2.  Start delay 30 minutes, pause time 15 minutes, duration 9 hours 3.  Sensing waveform was analyzed for 3 minutes 4.  Sleep remote education was provided patient demonstrated competency with the remote and was aware of patient Instruction videos and sleep remote guide 5.  Patient was instructed to step up levels by 1 level (0.1 V ) every week 6.  Check-in visit in 1 month after activation visit to ensure that they are stepping up levels, using therapy  all night, every night and to evaluate subjective benefit.   7.  Inspire titration sleep study will be scheduled 3 months after the activation visit  - Gradually increase device setting weekly until optimal comfort range is found. - Conduct sleep study after three months to determine optimal device setting. - Educate on device operation, including activation, pausing, and battery replacement. - Schedule follow-up appointment in one month.  Asthma Asthma, likely bronchial asthma, developed post-COVID. Managed with a nebulizer twice daily and a rescue inhaler as needed. She reports difficulty remembering to use the nebulizer consistently. - Encourage consistent use of nebulizer twice daily. - Continue rescue inhaler as needed.       No follow-ups on file.   Harden ROCKFORD Jude, MD

## 2023-11-13 ENCOUNTER — Encounter (HOSPITAL_BASED_OUTPATIENT_CLINIC_OR_DEPARTMENT_OTHER): Payer: Self-pay | Admitting: Pulmonary Disease

## 2023-11-13 ENCOUNTER — Ambulatory Visit (INDEPENDENT_AMBULATORY_CARE_PROVIDER_SITE_OTHER): Admitting: Pulmonary Disease

## 2023-11-13 VITALS — BP 136/81 | HR 74 | Ht 63.0 in | Wt 173.2 lb

## 2023-11-13 DIAGNOSIS — G4733 Obstructive sleep apnea (adult) (pediatric): Secondary | ICD-10-CM

## 2023-11-13 DIAGNOSIS — Z4542 Encounter for adjustment and management of neuropacemaker (brain) (peripheral nerve) (spinal cord): Secondary | ICD-10-CM

## 2023-11-13 DIAGNOSIS — J4531 Mild persistent asthma with (acute) exacerbation: Secondary | ICD-10-CM

## 2023-11-13 DIAGNOSIS — Z9682 Presence of neurostimulator: Secondary | ICD-10-CM

## 2023-11-13 NOTE — Patient Instructions (Signed)
 Increase by 1 level every week until discomfort

## 2023-11-13 NOTE — Progress Notes (Signed)
   Subjective:    Patient ID: Kathy [name], female    DOB: 1955-09-15, 68 y.o.   MRN: [id number]   68 year old female with obstructive sleep apnea for FU   hypoglossal nerve stimulator implantation on September 23, 2023   She has bronchial asthma, which developed post-COVID-19. She uses a nebulizer twice daily and a rescue inhaler as needed, though she occasionally forgets to use the nebulizer.   She experiences restless legs syndrome affecting her arms and legs, disrupting her sleep. Requip  is effective if taken on time  Activation 10/27/23  Sensation 0.3 V functional level 0.4 V lower limit, upper limit 1.4 V 2.  Start delay 30 minutes, pause time 15 minutes, duration 9 hours  Discussed the use of AI scribe software for clinical note transcription with the patient, who gave verbal consent to proceed.  History of Present Illness  POst activation visit  Imcoming to 0.5 V Numerous pauses, inactivations She has not noticed much difference in sleep , still  tired    Significant tests/ events reviewed   10/2021 NPSG >> AHI 26/h, low sat 83%  Review of Systems  neg for any significant sore throat, dysphagia, itching, sneezing, nasal congestion or excess/ purulent secretions, fever, chills, sweats, unintended wt loss, pleuritic or exertional cp, hempoptysis, orthopnea pnd or change in chronic leg swelling. Also denies presyncope, palpitations, heartburn, abdominal pain, nausea, vomiting, diarrhea or change in bowel or urinary habits, dysuria,hematuria, rash, arthralgias, visual complaints, headache, numbness weakness or ataxia.      Objective:   Physical Exam  Gen. Pleasant, obese, in no distress ENT - no lesions, no post nasal drip Neck: No JVD, no thyromegaly, no carotid bruits Lungs: no use of accessory muscles, no dullness to percussion, decreased without rales or rhonchi  Cardiovascular: Rhythm regular, heart sounds  normal, no murmurs or gallops, no peripheral  edema Musculoskeletal: No deformities, no cyanosis or clubbing , no tremors   Checked with programmer , good stimulation when voltage increased      Assessment & Plan:   Assessment and Plan Assessment & Plan  Hypoglossal nerve stimulator was reassessed today.  Goal of the follow-up visit was to ensure good compliance, good subjective benefit, good tongue motion and good sense lead waveforms .incision sites appear good, tongue protrusion was examined.  Download was reviewed and usage appears to be up to 7 hours average per night.   Programming :  1.  Sleep remote education was provided patient demonstrated competency with the remote and was aware of patient Instruction videos and sleep remote guide  Patient was instructed to step up levels by 1 level (0.1 V ) every week    Inspire titration sleep study has been scheduled in 2 months Download was reviewed to confirm good compliance with the device

## 2023-11-19 DIAGNOSIS — F3341 Major depressive disorder, recurrent, in partial remission: Secondary | ICD-10-CM | POA: Diagnosis not present

## 2023-11-20 ENCOUNTER — Ambulatory Visit (HOSPITAL_BASED_OUTPATIENT_CLINIC_OR_DEPARTMENT_OTHER): Payer: Self-pay | Admitting: Pulmonary Disease

## 2023-11-20 NOTE — Telephone Encounter (Signed)
 LVM to offer 10/17 acute visit appt at 11am.

## 2023-11-20 NOTE — Telephone Encounter (Signed)
 Patient has already lower voltage by 1 on Tuesday night this week.  Did not use inspire last week.  Patient has appointment scheduled for 11/21/2023 at 11 am with Dr. Alva  FYI Only or Action Required?: FYI only for provider.  Patient is followed in Pulmonology for sleep apnea, last seen on 11/13/2023 by Jude Harden GAILS, MD.  Called Nurse Triage reporting Cough.tongue pain  Symptoms began several days ago.  Interventions attempted: Other: adjusted inspire.  Symptoms are: unchanged.  Triage Disposition: See PCP When Office is Open (Within 3 Days)  Patient/caregiver understands and will follow disposition?: Yes

## 2023-11-20 NOTE — Telephone Encounter (Signed)
 Please advise as pt has increased her Inspire device and now having throat burning since increasing

## 2023-11-20 NOTE — Telephone Encounter (Signed)
 Called Kathy [name] to confirm her appointment for tomorrow with Dr. Jude @11 :00. Reached out to INSPIRE rep to see if they can be in attendance. They will provide an update in the morning regarding their availability. If they're unable to attend, they advised that we can reach out with any concerns. Nothing further needed at this time.

## 2023-11-20 NOTE — Telephone Encounter (Signed)
 FYI Only or Action Required?: Action required by provider: Please call patient with device recommendations.  Patient is followed in Pulmonology for INSPIRE, last seen on 11/13/2023 by Jude Harden GAILS, MD.  Called Nurse Triage reporting Cough.  Symptoms began several days ago.  Interventions attempted: Prescription medications: muscle relaxer.  Symptoms are: gradually worsening.  Triage Disposition: See PCP When Office is Open (Within 3 Days)  Patient/caregiver understands and will follow disposition?:  E2C2 Pulmonary Triage - Initial Assessment Questions "Chief Complaint (e.g., cough, sob, wheezing, fever, chills, sweat or additional symptoms) *Go to specific symptom protocol after initial questions. Throat burning  "How long have symptoms been present?" Since Monday   Have you tested for COVID or Flu? Note: If not, ask patient if a home test can be taken. If so, instruct patient to call back for positive results. No  MEDICINES:   "Have you used any OTC meds to help with symptoms?" No If yes, ask "What medications?" na  "Have you used your inhalers/maintenance medication?" No If yes, "What medications?" NA  If inhaler, ask "How many puffs and how often?" Note: Review instructions on medication in the chart. NA  OXYGEN: "Do you wear supplemental oxygen?" No If yes, "How many liters are you supposed to use?" NA  "Do you monitor your oxygen levels?" No If yes, What is your reading (oxygen level) today? NA  What is your usual oxygen saturation reading?  (Note: Pulmonary O2 sats should be 90% or greater) NA  Copied from CRM #[id number]. Topic: Clinical - Red Word Triage >> Nov 20, 2023  2:49 PM Rilla B wrote: [facility] that prompted transfer to Nurse Triage: Patient states she is an inspire patient.  Throat burning and difficulty swallowing, persistent coughing Reason for Disposition  [1] Sore throat is the only symptom AND [2] present > 48 hours  Answer Assessment -  Initial Assessment Questions Inspire patient with device implanted in beginning of August- Device FU last week with Dr Jude with no complications.  On Monday she turned her device up to 4 and has had throat burning and pain on the back right of her tongue. Pain 6/10 with swallowing. Mild cough due to irritation- more like throat clearing. Hydrated but not eating as much due to pain. She has reached out to surgeon who implanted device and no call back despite multiple attempts. She turned off the device and slept without it last night. Has not tried warm liquids or salt water  rinse. She is taking 1/2 tablet of a muscle relaxer at night with mild benefit.  Please reach out to patient with device recommendations.  1. ONSET: When did the throat start hurting? (Hours or days ago)      Since Monday 2. SEVERITY: How bad is the sore throat? (Scale 1-10; mild, moderate or severe)     Pain 6/10- swallowing it burns, pain on the back of her tongue on the right side,  3. STREP EXPOSURE: Has there been any exposure to strep within the past week? If Yes, ask: What type of contact occurred?      denies 4.  VIRAL SYMPTOMS: Are there any symptoms of a cold, such as a runny nose, cough, hoarse voice or red eyes?      denies 5. FEVER: Do you have a fever? If Yes, ask: What is your temperature, how was it measured, and when did it start?     denies 6. PUS ON THE TONSILS: Is there pus on the tonsils in the  back of your throat?     denies 7. OTHER SYMPTOMS: Do you have any other symptoms? (e.g., difficulty breathing, headache, rash)     Tongue red- can't tell if Ulcer or swollen, mild cough/throat clearing  Protocols used: Sore Throat-A-AH

## 2023-11-21 ENCOUNTER — Encounter (HOSPITAL_BASED_OUTPATIENT_CLINIC_OR_DEPARTMENT_OTHER): Payer: Self-pay | Admitting: Pulmonary Disease

## 2023-11-21 ENCOUNTER — Ambulatory Visit (INDEPENDENT_AMBULATORY_CARE_PROVIDER_SITE_OTHER): Admitting: Pulmonary Disease

## 2023-11-21 VITALS — BP 132/87 | HR 69 | Ht 63.0 in | Wt 172.3 lb

## 2023-11-21 DIAGNOSIS — Z9682 Presence of neurostimulator: Secondary | ICD-10-CM | POA: Diagnosis not present

## 2023-11-21 DIAGNOSIS — G4733 Obstructive sleep apnea (adult) (pediatric): Secondary | ICD-10-CM | POA: Diagnosis not present

## 2023-11-21 NOTE — Telephone Encounter (Signed)
 Kathy [name] came in for her appointment regarding concerns of cough and tongue pain. Per Dr. Jude, patient will remain on the current level for the INSPIRE device and will follow up at the next scheduled appointment. Nothing further need at this time.

## 2023-11-21 NOTE — Patient Instructions (Signed)
 Stay at level 3 for another week Increase to level 4 on 10/27  If tolerated increase to level 5 2-3 days prior to next appt

## 2023-11-21 NOTE — Progress Notes (Signed)
 Subjective:    Patient ID: Kathy [name], female    DOB: [birth year], 68 y.o.   MRN: [id number]  68 year old female with obstructive sleep apnea for FU   hypoglossal nerve stimulator implantation on September 23, 2023    She has bronchial asthma, which developed post-COVID-19. She uses a nebulizer twice daily and a rescue inhaler as needed, though she occasionally forgets to use the nebulizer.   She experiences restless legs syndrome affecting her arms and legs, disrupting her sleep. Requip  is effective if taken on time   Activation 10/27/23  Sensation 0.3 V functional level 0.4 V lower limit, upper limit 1.4 V Start delay 30 minutes, pause time 15 minutes, duration 9 hours  11/2023 POst activation visit Imcoming to 0.5 V   Discussed the use of AI scribe software for clinical note transcription with the patient, who gave verbal consent to proceed.  History of Present Illness Kathy [name] is a 68 year old female who presents with tongue and throat pain after adjusting her device settings.  She experiences tongue and throat pain associated with recent adjustments to her device settings. Initially, she increased the device to level four, causing difficulty swallowing and pain. She reduced the setting to level three, which is more tolerable but still causes discomfort and affects her sleep.  The pain is located in her tongue and throat, with difficulty swallowing food. She sometimes forgets to pause the device when getting up to use the bathroom.  She has frequent coughing, which she attributes to her throat. The cough has been persistent and seems to have worsened recently. She is concerned about the impact of scar tissue on her condition.     Significant tests/ events reviewed   10/2021 NPSG >> AHI 26/h, low sat 83%  Review of Systems  neg for any significant sore throat, dysphagia, itching, sneezing, nasal congestion or excess/ purulent secretions, fever, chills, sweats,  unintended wt loss, pleuritic or exertional cp, hempoptysis, orthopnea pnd or change in chronic leg swelling. Also denies presyncope, palpitations, heartburn, abdominal pain, nausea, vomiting, diarrhea or change in bowel or urinary habits, dysuria,hematuria, rash, arthralgias, visual complaints, headache, numbness weakness or ataxia.      Objective:   Physical Exam  Gen. Pleasant, obese, in no distress ENT - no lesions, no post nasal drip Neck: No JVD, no thyromegaly, no carotid bruits Lungs: no use of accessory muscles, no dullness to percussion, decreased without rales or rhonchi  Cardiovascular: Rhythm regular, heart sounds  normal, no murmurs or gallops, no peripheral edema Musculoskeletal: No deformities, no cyanosis or clubbing , no tremors   Tongue stimulation studied with inspire programmer at diff voltage levels    Assessment & Plan:   Assessment and Plan Assessment & Plan Obstructive sleep apnea status post hypoglossal nerve stimulator Experiencing issues with the hypoglossal nerve stimulator at higher stimulation levels, including tongue pain and dysphagia at level four, which improved at level three. The stimulator causes uneven tongue stimulation, leading to discomfort. The current approach uses a common stimulation method effective for 90% of patients, but alternative methods may be considered if necessary. - Decrease stimulator at level three (0.6 volts) & maintain for another week. - Attempt to increase stimulator to level four (0.7 volts) on October 27th. - If level four is tolerated, try increasing to level five a couple of days before the November 10th appointment. - If higher levels are too strong, reduce back to a tolerable level. - Conduct a sleep study  on December 8th to evaluate stimulator effectiveness.   Chronic cough Chronic cough, worsened recently, possibly related to scar tissue from previous surgeries.

## 2023-11-26 DIAGNOSIS — G4733 Obstructive sleep apnea (adult) (pediatric): Secondary | ICD-10-CM | POA: Diagnosis not present

## 2023-11-26 DIAGNOSIS — Z20822 Contact with and (suspected) exposure to covid-19: Secondary | ICD-10-CM | POA: Diagnosis not present

## 2023-11-26 DIAGNOSIS — J101 Influenza due to other identified influenza virus with other respiratory manifestations: Secondary | ICD-10-CM | POA: Diagnosis not present

## 2023-11-26 DIAGNOSIS — Z9682 Presence of neurostimulator: Secondary | ICD-10-CM | POA: Diagnosis not present

## 2023-11-26 DIAGNOSIS — R053 Chronic cough: Secondary | ICD-10-CM | POA: Diagnosis not present

## 2023-12-15 ENCOUNTER — Ambulatory Visit (INDEPENDENT_AMBULATORY_CARE_PROVIDER_SITE_OTHER): Admitting: Pulmonary Disease

## 2023-12-15 ENCOUNTER — Encounter (HOSPITAL_BASED_OUTPATIENT_CLINIC_OR_DEPARTMENT_OTHER): Payer: Self-pay | Admitting: Pulmonary Disease

## 2023-12-15 VITALS — BP 140/65 | HR 68 | Ht 63.0 in | Wt 172.7 lb

## 2023-12-15 DIAGNOSIS — Z4542 Encounter for adjustment and management of neuropacemaker (brain) (peripheral nerve) (spinal cord): Secondary | ICD-10-CM | POA: Diagnosis not present

## 2023-12-15 DIAGNOSIS — G4733 Obstructive sleep apnea (adult) (pediatric): Secondary | ICD-10-CM

## 2023-12-15 DIAGNOSIS — Z9682 Presence of neurostimulator: Secondary | ICD-10-CM | POA: Diagnosis not present

## 2023-12-15 NOTE — Progress Notes (Signed)
 Subjective:    Patient ID: Kathy [name], female    DOB: [birth year], 68 y.o.   MRN: [id number]   68 year old female with obstructive sleep apnea for FU   hypoglossal nerve stimulator implantation on September 23, 2023    She has bronchial asthma, which developed post-COVID-19. She uses a nebulizer twice daily and a rescue inhaler as needed, though she occasionally forgets to use the nebulizer.   She experiences restless legs syndrome affecting her arms and legs, disrupting her sleep. Requip  is effective if taken on time   Activation 10/27/23  Sensation 0.3 V functional level 0.4 V lower limit, upper limit 1.4 V Start delay 30 minutes, pause time 15 minutes, duration 9 hours   11/2023 POst activation visit Imcoming to 0.5 V OV 11/2023 > incoming 0.5 >> back down to 0.6, 0.7 was too strong  Discussed the use of AI scribe software for clinical note transcription with the patient, who gave verbal consent to proceed.  History of Present Illness Kathy [name] is a 67 year old female with obstructive sleep apnea status post hypoglossal nerve stimulator implant who presents for a readiness check prior to a sleep study. She is accompanied by a friend.  She experiences issues with the hypoglossal nerve stimulator settings, finding the stimulation too strong at 0.7, causing soreness, tongue swelling, difficulty swallowing, and ear pain. She adjusts the settings frequently, using the device for three nights at a time, but finds it aggravating as it sometimes feels too strong.  Her sleep is inconsistent, with frequent awakenings, sometimes every hour, leading to increased fatigue. She naps during the day due to tiredness, which is unusual for her. Despite using the device, she has not experienced improvement in feeling rested.  She uses the device at home with varying settings, feeling the stimulation in different areas of her tongue. She continues to experiment with settings to find a  comfortable level but has not yet succeeded.    Significant tests/ events reviewed   10/2021 NPSG >> AHI 26/h, low sat 83%   Review of Systems  neg for any significant sore throat, dysphagia, itching, sneezing, nasal congestion or excess/ purulent secretions, fever, chills, sweats, unintended wt loss, pleuritic or exertional cp, hempoptysis, orthopnea pnd or change in chronic leg swelling. Also denies presyncope, palpitations, heartburn, abdominal pain, nausea, vomiting, diarrhea or change in bowel or urinary habits, dysuria,hematuria, rash, arthralgias, visual complaints, headache, numbness weakness or ataxia.      Objective:   Physical Exam  Gen. Pleasant, obese, in no distress ENT - no lesions, no post nasal drip Neck: No JVD, no thyromegaly, no carotid bruits Lungs: no use of accessory muscles, no dullness to percussion, decreased without rales or rhonchi  Cardiovascular: Rhythm regular, heart sounds  normal, no murmurs or gallops, no peripheral edema Musculoskeletal: No deformities, no cyanosis or clubbing , no tremors  Tongue stimulation/protrusion checked with programmer at multiple levels, tongue deviates to left We studied alternative elcetrodes & PWR      Assessment & Plan:   Assessment and Plan Assessment & Plan Obstructive sleep apnea status post hypoglossal nerve stimulator implant Experiencing discomfort and tongue deviation with current settings. Reports soreness and difficulty swallowing when settings are too high. Current settings are being adjusted to find a comfortable level. Plan to conduct a sleep study to determine optimal settings. - Adjusted hypoglossal nerve stimulator settings to 60/40 pulse width ratio-she could tolerate higher voltage but kept to 90/33 - Start at 0.7 and  attempt increase to 0.8 in 1 week. - Conduct sleep study on November 8th to determine optimal settings.  Fatigue Reports increased fatigue and disrupted sleep patterns, including  frequent awakenings and daytime napping. Fatigue may be related to obstructive sleep apnea and current stimulator settings. - Continue using hypoglossal nerve stimulator with adjusted settings to improve sleep quality. - Monitor fatigue levels and adjust stimulator settings as needed.

## 2023-12-15 NOTE — Patient Instructions (Signed)
  VISIT SUMMARY: You came in today for a readiness check before your upcoming sleep study. We discussed the issues you are experiencing with your hypoglossal nerve stimulator, including discomfort and inconsistent sleep. We made some adjustments to your device settings to help improve your sleep quality.  YOUR PLAN: -OBSTRUCTIVE SLEEP APNEA STATUS POST HYPOGLOSSAL NERVE STIMULATOR IMPLANT: Obstructive sleep apnea is a condition where your airway becomes blocked during sleep, causing breathing pauses. You have a hypoglossal nerve stimulator implant to help manage this condition. We adjusted your device settings to a 60/40 pulse width ratio and recommend starting at point 8. A sleep study on November 8th will help us  determine the optimal settings for you.  -FATIGUE: Fatigue is a feeling of extreme tiredness and lack of energy. Your fatigue may be related to your obstructive sleep apnea and the current settings of your hypoglossal nerve stimulator. We recommend continuing to use the device with the new settings and monitoring your fatigue levels. Adjust the settings as needed to improve your sleep quality.  INSTRUCTIONS: Please continue using your hypoglossal nerve stimulator with the new settings we discussed. Your sleep study is scheduled for November 8th, which will help us  find the best settings for your device. Monitor your fatigue levels and adjust the settings if necessary. If you have any concerns or experience any issues, please contact our office.                      Contains text generated by [employer].                                 Contains text generated by [employer].

## 2024-01-05 DIAGNOSIS — M25512 Pain in left shoulder: Secondary | ICD-10-CM | POA: Diagnosis not present

## 2024-01-05 DIAGNOSIS — M25511 Pain in right shoulder: Secondary | ICD-10-CM | POA: Diagnosis not present

## 2024-01-08 DIAGNOSIS — E039 Hypothyroidism, unspecified: Secondary | ICD-10-CM | POA: Diagnosis not present

## 2024-01-12 ENCOUNTER — Ambulatory Visit (HOSPITAL_BASED_OUTPATIENT_CLINIC_OR_DEPARTMENT_OTHER): Admitting: Pulmonary Disease

## 2024-01-12 ENCOUNTER — Encounter (HOSPITAL_BASED_OUTPATIENT_CLINIC_OR_DEPARTMENT_OTHER): Payer: Self-pay

## 2024-01-15 DIAGNOSIS — R053 Chronic cough: Secondary | ICD-10-CM | POA: Diagnosis not present

## 2024-01-15 DIAGNOSIS — G4733 Obstructive sleep apnea (adult) (pediatric): Secondary | ICD-10-CM | POA: Diagnosis not present

## 2024-01-15 DIAGNOSIS — M159 Polyosteoarthritis, unspecified: Secondary | ICD-10-CM | POA: Diagnosis not present

## 2024-01-15 DIAGNOSIS — E039 Hypothyroidism, unspecified: Secondary | ICD-10-CM | POA: Diagnosis not present

## 2024-01-15 DIAGNOSIS — Z6829 Body mass index (BMI) 29.0-29.9, adult: Secondary | ICD-10-CM | POA: Diagnosis not present

## 2024-01-15 DIAGNOSIS — Z23 Encounter for immunization: Secondary | ICD-10-CM | POA: Diagnosis not present

## 2024-01-22 ENCOUNTER — Ambulatory Visit (INDEPENDENT_AMBULATORY_CARE_PROVIDER_SITE_OTHER): Admitting: Pulmonary Disease

## 2024-01-22 VITALS — BP 168/83 | HR 63 | Temp 98.0°F | Ht 63.0 in | Wt 170.7 lb

## 2024-01-22 DIAGNOSIS — R053 Chronic cough: Secondary | ICD-10-CM | POA: Diagnosis not present

## 2024-01-22 DIAGNOSIS — Z9682 Presence of neurostimulator: Secondary | ICD-10-CM | POA: Diagnosis not present

## 2024-01-22 DIAGNOSIS — G4733 Obstructive sleep apnea (adult) (pediatric): Secondary | ICD-10-CM | POA: Diagnosis not present

## 2024-01-22 NOTE — Patient Instructions (Addendum)
 X await sleep study  If cough is no better we can try low dose gabapentin   X recheck BP

## 2024-01-22 NOTE — Progress Notes (Signed)
° °  Subjective:    Patient ID: Kathy [name], female    DOB: [birth year], 68 y.o.   MRN: [id number]   68 year old female with obstructive sleep apnea for FU   hypoglossal nerve stimulator implantation on September 23, 2023    She has bronchial asthma, which developed post-COVID-19. She uses a nebulizer twice daily and a rescue inhaler as needed, though she occasionally forgets to use the nebulizer.   She experiences restless legs syndrome affecting her arms and legs, disrupting her sleep. Requip  is effective if taken on time   Activation 10/27/23  Sensation 0.3 V functional level 0.4 V lower limit, upper limit 1.4 V Start delay 30 minutes, pause time 15 minutes, duration 9 hours   11/2023 POst activation visit Imcoming to 0.5 V OV 11/2023 > incoming 0.5 >> back down to 0.6, 0.7 was too strong  12/2023 >>We studied alternative elcetrodes & PWR >>  kept to 90/33 - Start at 0.7 and attempt increase to 0.8 in 1 week.   Discussed the use of AI scribe software for clinical note transcription with the patient, who gave verbal consent to proceed.  History of Present Illness   Complains of chronic cough she has seen Dr. Garnette Silvan with ENT in 2019 felt to be neurogenic with laryngospasm was referred to voice training.  She is maintained at 0.8 V, did not undergo sleep study due to ice.     Significant tests/ events reviewed   10/2021 NPSG >> AHI 26/h, low sat 83%  CT cors 06/2022 clear lungs  Review of Systems  neg for any significant sore throat, dysphagia, itching, sneezing, nasal congestion or excess/ purulent secretions, fever, chills, sweats, unintended wt loss, pleuritic or exertional cp, hempoptysis, orthopnea pnd or change in chronic leg swelling. Also denies presyncope, palpitations, heartburn, abdominal pain, nausea, vomiting, diarrhea or change in bowel or urinary habits, dysuria,hematuria, rash, arthralgias, visual complaints, headache, numbness weakness or ataxia.       Objective:   Physical Exam  Gen. Pleasant, obese, in no distress ENT - no lesions, no post nasal drip Neck: No JVD, no thyromegaly, no carotid bruits Lungs: no use of accessory muscles, no dullness to percussion, decreased without rales or rhonchi  Cardiovascular: Rhythm regular, heart sounds  normal, no murmurs or gallops, no peripheral edema Musculoskeletal: No deformities, no cyanosis or clubbing , no tremors  Tongue stimulation/protrusion checked with programmer at multiple levels  Download was reviewed average +0.690% nights used 70% usage more than 4 hours    Assessment & Plan:   Assessment and Plan Assessment & Plan   OSA status post hypoglossal nerve stimulator device. Proceed with inspire titration study  Chronic cough -await repeat ENT evaluation, if no better can initiate gabapentin  for neurogenic cough.  Hypertension -recheck and follow-up with PCP

## 2024-02-04 ENCOUNTER — Telehealth: Payer: Self-pay | Admitting: Pulmonary Disease

## 2024-02-04 DIAGNOSIS — Z9682 Presence of neurostimulator: Secondary | ICD-10-CM

## 2024-02-04 DIAGNOSIS — G4733 Obstructive sleep apnea (adult) (pediatric): Secondary | ICD-10-CM

## 2024-02-04 NOTE — Telephone Encounter (Signed)
 New or der placed for inspire titration

## 2024-02-17 ENCOUNTER — Ambulatory Visit (HOSPITAL_COMMUNITY): Admit: 2024-02-17 | Admitting: Pulmonary Disease

## 2024-02-17 SURGERY — DRUG INDUCED SLEEP ENDOSCOPY

## 2024-03-30 ENCOUNTER — Ambulatory Visit (HOSPITAL_BASED_OUTPATIENT_CLINIC_OR_DEPARTMENT_OTHER): Admitting: Pulmonary Disease

## 2024-04-19 ENCOUNTER — Ambulatory Visit (HOSPITAL_BASED_OUTPATIENT_CLINIC_OR_DEPARTMENT_OTHER): Admitting: Pulmonary Disease
